# Patient Record
Sex: Female | Born: 1965 | Race: Black or African American | Hispanic: No | Marital: Single | Smoking: Former smoker
Health system: Southern US, Community
[De-identification: ages and names within clinical notes are randomized; demographics above are authoritative.]

## PROBLEM LIST (undated history)

## (undated) DIAGNOSIS — K219 Gastro-esophageal reflux disease without esophagitis: Secondary | ICD-10-CM

## (undated) DIAGNOSIS — H903 Sensorineural hearing loss, bilateral: Secondary | ICD-10-CM

## (undated) DIAGNOSIS — H9190 Unspecified hearing loss, unspecified ear: Secondary | ICD-10-CM

## (undated) DIAGNOSIS — I10 Essential (primary) hypertension: Secondary | ICD-10-CM

## (undated) DIAGNOSIS — I6522 Occlusion and stenosis of left carotid artery: Secondary | ICD-10-CM

## (undated) DIAGNOSIS — Z0000 Encounter for general adult medical examination without abnormal findings: Secondary | ICD-10-CM

## (undated) DIAGNOSIS — D649 Anemia, unspecified: Secondary | ICD-10-CM

## (undated) DIAGNOSIS — E785 Hyperlipidemia, unspecified: Secondary | ICD-10-CM

## (undated) DIAGNOSIS — C310 Malignant neoplasm of maxillary sinus: Secondary | ICD-10-CM

## (undated) DIAGNOSIS — G479 Sleep disorder, unspecified: Secondary | ICD-10-CM

## (undated) DIAGNOSIS — R1310 Dysphagia, unspecified: Secondary | ICD-10-CM

## (undated) DIAGNOSIS — C300 Malignant neoplasm of nasal cavity: Secondary | ICD-10-CM

## (undated) DIAGNOSIS — C760 Malignant neoplasm of head, face and neck: Secondary | ICD-10-CM

## (undated) DIAGNOSIS — A6000 Herpesviral infection of urogenital system, unspecified: Secondary | ICD-10-CM

## (undated) HISTORY — DX: Malignant neoplasm of nasal cavity: C30.0

## (undated) HISTORY — PX: FRACTURE SURGERY: SHX138

## (undated) HISTORY — DX: Hyperlipidemia, unspecified: E78.5

## (undated) HISTORY — DX: Unspecified hearing loss, unspecified ear: H91.90

---
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
## 1987-06-20 DIAGNOSIS — C310 Malignant neoplasm of maxillary sinus: Secondary | ICD-10-CM

## 1987-06-20 HISTORY — DX: Malignant neoplasm of maxillary sinus: C31.0

## 1999-12-13 ENCOUNTER — Emergency Department (HOSPITAL_COMMUNITY): Admission: EM | Admit: 1999-12-13 | Discharge: 1999-12-13 | Payer: Self-pay | Admitting: Emergency Medicine

## 1999-12-13 ENCOUNTER — Encounter: Payer: Self-pay | Admitting: Emergency Medicine

## 2003-02-12 ENCOUNTER — Other Ambulatory Visit: Admission: RE | Admit: 2003-02-12 | Discharge: 2003-02-12 | Payer: Self-pay | Admitting: Obstetrics and Gynecology

## 2003-04-03 ENCOUNTER — Ambulatory Visit (HOSPITAL_COMMUNITY): Admission: RE | Admit: 2003-04-03 | Discharge: 2003-04-03 | Payer: Self-pay | Admitting: Obstetrics and Gynecology

## 2003-04-03 ENCOUNTER — Encounter (INDEPENDENT_AMBULATORY_CARE_PROVIDER_SITE_OTHER): Payer: Self-pay | Admitting: *Deleted

## 2003-04-03 HISTORY — PX: TUBAL LIGATION: SHX77

## 2005-01-19 ENCOUNTER — Ambulatory Visit: Payer: Self-pay | Admitting: Internal Medicine

## 2005-01-23 ENCOUNTER — Ambulatory Visit: Payer: Self-pay | Admitting: *Deleted

## 2005-11-09 ENCOUNTER — Ambulatory Visit: Payer: Self-pay | Admitting: Family Medicine

## 2005-12-06 ENCOUNTER — Ambulatory Visit: Payer: Self-pay | Admitting: Family Medicine

## 2006-02-28 ENCOUNTER — Encounter (INDEPENDENT_AMBULATORY_CARE_PROVIDER_SITE_OTHER): Payer: Self-pay | Admitting: *Deleted

## 2006-02-28 ENCOUNTER — Ambulatory Visit: Payer: Self-pay | Admitting: Family Medicine

## 2006-04-16 ENCOUNTER — Ambulatory Visit: Payer: Self-pay | Admitting: Family Medicine

## 2006-10-08 ENCOUNTER — Ambulatory Visit: Payer: Self-pay | Admitting: Family Medicine

## 2007-01-04 ENCOUNTER — Ambulatory Visit (HOSPITAL_COMMUNITY): Admission: RE | Admit: 2007-01-04 | Discharge: 2007-01-04 | Payer: Self-pay | Admitting: Otolaryngology

## 2007-01-10 ENCOUNTER — Ambulatory Visit (HOSPITAL_COMMUNITY): Admission: RE | Admit: 2007-01-10 | Discharge: 2007-01-10 | Payer: Self-pay | Admitting: Otolaryngology

## 2007-06-05 ENCOUNTER — Ambulatory Visit (HOSPITAL_COMMUNITY): Admission: RE | Admit: 2007-06-05 | Discharge: 2007-06-06 | Payer: Self-pay | Admitting: Otolaryngology

## 2007-06-05 ENCOUNTER — Encounter (INDEPENDENT_AMBULATORY_CARE_PROVIDER_SITE_OTHER): Payer: Self-pay | Admitting: Otolaryngology

## 2007-06-05 HISTORY — PX: EXPLORATORY TYMPANOTOMY: SHX1545

## 2008-10-14 ENCOUNTER — Emergency Department (HOSPITAL_COMMUNITY): Admission: EM | Admit: 2008-10-14 | Discharge: 2008-10-14 | Payer: Self-pay | Admitting: Emergency Medicine

## 2008-12-30 ENCOUNTER — Emergency Department (HOSPITAL_COMMUNITY): Admission: EM | Admit: 2008-12-30 | Discharge: 2008-12-30 | Payer: Self-pay | Admitting: Family Medicine

## 2008-12-30 DIAGNOSIS — A6000 Herpesviral infection of urogenital system, unspecified: Secondary | ICD-10-CM

## 2008-12-30 HISTORY — DX: Herpesviral infection of urogenital system, unspecified: A60.00

## 2009-09-14 ENCOUNTER — Emergency Department (HOSPITAL_COMMUNITY): Admission: EM | Admit: 2009-09-14 | Discharge: 2009-09-14 | Payer: Self-pay | Admitting: Emergency Medicine

## 2009-10-21 ENCOUNTER — Emergency Department (HOSPITAL_COMMUNITY): Admission: EM | Admit: 2009-10-21 | Discharge: 2009-10-21 | Payer: Self-pay | Admitting: Emergency Medicine

## 2010-04-15 ENCOUNTER — Ambulatory Visit: Payer: Self-pay | Admitting: Nurse Practitioner

## 2010-04-15 DIAGNOSIS — I10 Essential (primary) hypertension: Secondary | ICD-10-CM | POA: Insufficient documentation

## 2010-04-15 LAB — CONVERTED CEMR LAB

## 2010-05-05 ENCOUNTER — Other Ambulatory Visit
Admission: RE | Admit: 2010-05-05 | Discharge: 2010-05-05 | Payer: Self-pay | Source: Home / Self Care | Admitting: Nurse Practitioner

## 2010-05-05 ENCOUNTER — Ambulatory Visit: Payer: Self-pay | Admitting: Nurse Practitioner

## 2010-05-05 ENCOUNTER — Encounter (INDEPENDENT_AMBULATORY_CARE_PROVIDER_SITE_OTHER): Payer: Self-pay | Admitting: Nurse Practitioner

## 2010-05-05 ENCOUNTER — Encounter: Payer: Self-pay | Admitting: Nurse Practitioner

## 2010-05-05 DIAGNOSIS — G479 Sleep disorder, unspecified: Secondary | ICD-10-CM

## 2010-05-05 DIAGNOSIS — C300 Malignant neoplasm of nasal cavity: Secondary | ICD-10-CM | POA: Insufficient documentation

## 2010-05-05 DIAGNOSIS — H9190 Unspecified hearing loss, unspecified ear: Secondary | ICD-10-CM | POA: Insufficient documentation

## 2010-05-05 HISTORY — DX: Sleep disorder, unspecified: G47.9

## 2010-05-05 HISTORY — DX: Unspecified hearing loss, unspecified ear: H91.90

## 2010-05-05 HISTORY — DX: Malignant neoplasm of nasal cavity: C30.0

## 2010-05-05 LAB — CONVERTED CEMR LAB
Ketones, urine, test strip: NEGATIVE
Nitrite: NEGATIVE
Rapid HIV Screen: NEGATIVE
Specific Gravity, Urine: <1.005
Urobilinogen, UA: 0.2
WBC Urine, dipstick: NEGATIVE

## 2010-05-09 ENCOUNTER — Encounter (INDEPENDENT_AMBULATORY_CARE_PROVIDER_SITE_OTHER): Payer: Self-pay | Admitting: Nurse Practitioner

## 2010-05-09 LAB — CONVERTED CEMR LAB: Retic Ct Pct: 1.3 % (ref 0.4–3.1)

## 2010-05-10 ENCOUNTER — Encounter (INDEPENDENT_AMBULATORY_CARE_PROVIDER_SITE_OTHER): Payer: Self-pay | Admitting: Nurse Practitioner

## 2010-05-10 LAB — CONVERTED CEMR LAB
Albumin: 4.7 g/dL (ref 3.5–5.2)
BUN: 8 mg/dL (ref 6–23)
Calcium: 10.1 mg/dL (ref 8.4–10.5)
Chlamydia, DNA Probe: NEGATIVE
Chloride: 90 meq/L — ABNORMAL LOW (ref 96–112)
Cholesterol: 225 mg/dL — ABNORMAL HIGH (ref 0–200)
Glucose, Bld: 67 mg/dL — ABNORMAL LOW (ref 70–99)
HDL: 64 mg/dL (ref >39)
Hemoglobin: 9.6 g/dL — ABNORMAL LOW (ref 12.0–15.0)
Lymphs Abs: 2.1 10*3/uL (ref 0.7–4.0)
MCV: 82.2 fL (ref 78.0–100.0)
Monocytes Absolute: 1 10*3/uL (ref 0.1–1.0)
Monocytes Relative: 10 % (ref 3–12)
Neutro Abs: 6.2 10*3/uL (ref 1.7–7.7)
Neutrophils Relative %: 65 % (ref 43–77)
Pap Smear: NEGATIVE
Potassium: 3.4 meq/L — ABNORMAL LOW (ref 3.5–5.3)
RBC: 3.93 M/uL (ref 3.87–5.11)
Triglycerides: 108 mg/dL (ref <150)
WBC: 9.5 10*3/uL (ref 4.0–10.5)

## 2010-05-11 ENCOUNTER — Encounter (INDEPENDENT_AMBULATORY_CARE_PROVIDER_SITE_OTHER): Payer: Self-pay | Admitting: *Deleted

## 2010-05-17 ENCOUNTER — Ambulatory Visit (HOSPITAL_COMMUNITY): Admission: RE | Admit: 2010-05-17 | Payer: Self-pay | Admitting: Internal Medicine

## 2010-05-26 ENCOUNTER — Encounter (INDEPENDENT_AMBULATORY_CARE_PROVIDER_SITE_OTHER): Payer: Self-pay | Admitting: Internal Medicine

## 2010-05-26 ENCOUNTER — Ambulatory Visit (HOSPITAL_COMMUNITY)
Admission: RE | Admit: 2010-05-26 | Discharge: 2010-05-26 | Payer: Self-pay | Source: Home / Self Care | Attending: Internal Medicine | Admitting: Internal Medicine

## 2010-05-26 DIAGNOSIS — I519 Heart disease, unspecified: Secondary | ICD-10-CM | POA: Insufficient documentation

## 2010-06-08 ENCOUNTER — Ambulatory Visit: Payer: Self-pay | Admitting: Nurse Practitioner

## 2010-06-14 ENCOUNTER — Telehealth (INDEPENDENT_AMBULATORY_CARE_PROVIDER_SITE_OTHER): Payer: Self-pay | Admitting: Nurse Practitioner

## 2010-06-17 ENCOUNTER — Ambulatory Visit: Admit: 2010-06-17 | Payer: Self-pay | Admitting: Nurse Practitioner

## 2010-07-04 DIAGNOSIS — R011 Cardiac murmur, unspecified: Secondary | ICD-10-CM | POA: Insufficient documentation

## 2010-07-09 ENCOUNTER — Encounter: Payer: Self-pay | Admitting: Internal Medicine

## 2010-07-10 ENCOUNTER — Encounter: Payer: Self-pay | Admitting: Emergency Medicine

## 2010-07-11 ENCOUNTER — Encounter: Payer: Self-pay | Admitting: Emergency Medicine

## 2010-07-14 ENCOUNTER — Ambulatory Visit
Admission: RE | Admit: 2010-07-14 | Discharge: 2010-07-14 | Payer: Self-pay | Source: Home / Self Care | Attending: Nurse Practitioner | Admitting: Nurse Practitioner

## 2010-07-14 ENCOUNTER — Encounter (INDEPENDENT_AMBULATORY_CARE_PROVIDER_SITE_OTHER): Payer: Self-pay | Admitting: Nurse Practitioner

## 2010-07-15 DIAGNOSIS — D62 Acute posthemorrhagic anemia: Secondary | ICD-10-CM | POA: Insufficient documentation

## 2010-07-15 LAB — CONVERTED CEMR LAB
BUN: 11 mg/dL (ref 6–23)
Basophils Relative: 0 % (ref 0–1)
Chloride: 95 meq/L — ABNORMAL LOW (ref 96–112)
MCHC: 30 g/dL (ref 30.0–36.0)
Monocytes Relative: 10 % (ref 3–12)
Neutro Abs: 6.7 10*3/uL (ref 1.7–7.7)
Neutrophils Relative %: 67 % (ref 43–77)
Potassium: 3.8 meq/L (ref 3.5–5.3)
RBC: 3.6 M/uL — ABNORMAL LOW (ref 3.87–5.11)
WBC: 10 10*3/uL (ref 4.0–10.5)

## 2010-07-19 NOTE — Letter (Signed)
Summary: Handout Printed  Printed Handout:  - Diet - Low-Cholesterol Guidelines 

## 2010-07-19 NOTE — Letter (Signed)
Summary: *HSN Results Follow up  [facility]  [address] [place], [place] [address]   Phone: [phone]  Fax: [phone]      05/11/2010   Stacy Anderson [address] [place], [place]  [address]   Dear  Ms. [name],                            ____S.Drinkard,FNP   ____D. Gore,FNP       ____B. McPherson,MD   ____V. Rankins,MD    ____E. Mulberry,MD    ___X_N. Daphine Deutscher, FNP  ____D. Reche Dixon, MD    ____K. Philipp Deputy, MD    ____Other     This letter is to inform you that your recent test(s):  _______Pap Smear    _______Lab Test     _______X-ray    _______ is within acceptable limits  _______ requires a medication change  _______ requires a follow-up lab visit  _______ requires a follow-up visit with your [name]   Comments: I BEEN TRYING TO CONTACT YOU OVER THE PHONE AND LEAVE YOU A                                                          MESSAGE . YOU HAVE AN APPT 05-26-10 @ 10 AM [name] FIRST FLOOR ADMITTING  YOU CAN ALSO CALL THE OFFICE  THANK YOU        _________________________________________________________ If you have any questions, please contact our office                     Sincerely,  Cheryll Dessert [facility]

## 2010-07-19 NOTE — Assessment & Plan Note (Signed)
Summary: Re-establish care   Vital Signs:  Patient profile:   45 year old female LMP:     03/2010 Height:      66.75 inches Weight:      170.2 pounds BMI:     26.95 Temp:     97.6 degrees F oral Pulse rate:   84 / minute Pulse rhythm:   regular Resp:     20 per minute BP sitting:   208 / 124  (left arm) Cuff size:   regular  Vitals Entered By: Levon Hedger (April 15, 2010 4:04 PM)  Nutrition Counseling: Patient's BMI is greater than 25 and therefore counseled on weight management options. CC: Hypertension Management Is Patient Diabetic? No Pain Assessment Patient in pain? yes     Location: head  Does patient need assistance? Functional Status Self care Ambulation Normal LMP (date): 03/2010     Enter LMP: 03/2010 Last PAP Result historical per pt   CC:  Hypertension Management.  History of Present Illness:  Pt into the office re-establish care. Pt was previously seen here but about 5 years ago was her last visit. She has been seeing Dr. Tresa Endo since her last visit here.  HTN - Pt has been out of her blood pressure meds for the past 2 months.  Pt was displaced from her job in April 2011. She has not taking her meds since August due to finances She is working a part-time job at this time.  Hypertension History:      She complains of headache, but denies chest pain and palpitations.  She notes no problems with any antihypertensive medication side effects.  pt has not been taking meds for the past 2 months.        Positive major cardiovascular risk factors include hypertension and current tobacco user.  Negative major cardiovascular risk factors include female age less than 29 years old.     Habits & Providers  Alcohol-Tobacco-Diet     Alcohol drinks/day: 0     Tobacco Status: current     Tobacco Counseling: to quit use of tobacco products     Cigarette Packs/Day: cigars  Exercise-Depression-Behavior     Have you felt down or hopeless? no     Have you  felt little pleasure in things? no     Drug Use: never  Allergies (verified): No Known Drug Allergies  Family History: mother - htn father- diabetes  Social History: single no children tobacco - cigars drug -none ETOH - noneSmoking Status:  current Drug Use:  never Packs/Day:  cigars  Review of Systems CV:  Denies chest pain or discomfort. Resp:  Denies cough. GI:  Denies abdominal pain, nausea, and vomiting.  Physical Exam  General:  alert.   Head:  normocephalic.   Lungs:  normal breath sounds.   Heart:  normal rate and regular rhythm.   Abdomen:  normal bowel sounds.   Msk:  normal ROM.   Neurologic:  alert & oriented X3.   Psych:  Oriented X3.     Impression & Recommendations:  Problem # 1:  HYPERTENSION, BENIGN ESSENTIAL (ICD-401.1) DASH diet restarted med  Her updated medication list for this problem includes:    Hydrochlorothiazide 25 Mg Tabs (Hydrochlorothiazide) ..... One tablet by mouth daily for blood pressure    Amlodipine Besylate 5 Mg Tabs (Amlodipine besylate) ..... One tablet by mouth daily for blood pressure  Problem # 2:  NEED PROPHYLACTIC VACCINATION&INOCULATION FLU (ICD-V04.81) Assessment: Unchanged given today  Complete Medication List: 1)  Hydrochlorothiazide 25 Mg Tabs (Hydrochlorothiazide) .... One tablet by mouth daily for blood pressure 2)  Amlodipine Besylate 5 Mg Tabs (Amlodipine besylate) .... One tablet by mouth daily for blood pressure  Hypertension Assessment/Plan:      The patient's hypertensive risk group is category B: At least one risk factor (excluding diabetes) with no target organ damage.  Today's blood pressure is 208/124.  Her blood pressure goal is < 140/90.  Patient Instructions: 1)  Schedule an appointment for a complete physical exam and blood pressure. 2)  Come fasting after midnight before this visit. 3)  Will need fasting labs, PAP, mammogram, PHQ-9, u/a, microalbumin, EKG.   Prescriptions: HYDROCHLOROTHIAZIDE 25 MG TABS (HYDROCHLOROTHIAZIDE) One tablet by mouth daily for blood pressure  #30 x 1   Entered and Authorized by:   Lehman Prom FNP   Signed by:   Lehman Prom FNP on 04/15/2010   Method used:   Print then Give to Patient   RxID:   [id number] AMLODIPINE BESYLATE 5 MG TABS (AMLODIPINE BESYLATE) One tablet by mouth daily for blood pressure  #30 x 1   Entered and Authorized by:   Lehman Prom FNP   Signed by:   Lehman Prom FNP on 04/15/2010   Method used:   Print then Give to Patient   RxID:   [id number]    Orders Added: 1)  Est. Patient Level III [95284]  Appended Document: Re-establish care     Allergies: No Known Drug Allergies   Complete Medication List: 1)  Hydrochlorothiazide 25 Mg Tabs (Hydrochlorothiazide) .... One tablet by mouth daily for blood pressure 2)  Amlodipine Besylate 5 Mg Tabs (Amlodipine besylate) .... One tablet by mouth daily for blood pressure  Other Orders: Flu Vaccine 84yrs + (13244) Admin 1st Vaccine (01027)   Orders Added: 1)  Flu Vaccine 31yrs + [25366] 2)  Admin 1st Vaccine [44034]   Immunizations Administered:  Influenza Vaccine # 1:    Vaccine Type: Fluvax 3+    Site: right deltoid    Mfr: GlaxoSmithKline    Dose: 0.5 ml    Route: IM    Given by: Levon Hedger    Exp. Date: 12/17/2010    Lot #: [id number]    VIS given: 01/11/10 version given May 05, 2010.  Flu Vaccine Consent Questions:    Do you have a history of severe allergic reactions to this vaccine? no    Any prior history of allergic reactions to egg and/or gelatin? no    Do you have a sensitivity to the preservative Thimersol? no    Do you have a past history of Guillan-Barre Syndrome? no    Do you currently have an acute febrile illness? no    Have you ever had a severe reaction to latex? no    Vaccine information given and explained to patient? yes    Are you currently pregnant? no    ndc   [phone]  Immunizations Administered:  Influenza Vaccine # 1:    Vaccine Type: Fluvax 3+    Site: right deltoid    Mfr: GlaxoSmithKline    Dose: 0.5 ml    Route: IM    Given by: Levon Hedger    Exp. Date: 12/17/2010    Lot #: [id number]    VIS given: 01/11/10 version given May 05, 2010.

## 2010-07-19 NOTE — Assessment & Plan Note (Signed)
Summary: Complete Physical exam   Vital Signs:  Patient profile:   45 year old female Menstrual status:  irregular LMP:     04/19/2010 Weight:      161.5 pounds BMI:     25.58 Temp:     96.9 degrees F oral Pulse rate:   80 / minute Pulse rhythm:   regular Resp:     20 per minute BP sitting:   146 / 90  (left arm) Cuff size:   regular  Vitals Entered By: Levon Hedger (May 05, 2010 10:07 AM)  Nutrition Counseling: Patient's BMI is greater than 25 and therefore counseled on weight management options. CC: CPP Is Patient Diabetic? No Pain Assessment Patient in pain? no       Does patient need assistance? Functional Status Self care Ambulation Normal LMP (date): 04/19/2010 LMP - Character: normal     Menstrual flow (days): 7 Menstrual Status irregular Enter LMP: 04/19/2010 Last PAP Result historical per pt   CC:  CPP.  History of Present Illness:  Pt into the office for a complete physical exam  PAP - all previous PAP smears ok.  last done 2 years ago. Menses - irregular. LMP November 1st and stayed for 7 days but prior to then menses was last in July 2011 No children Pt is sexually active.  Mammogram - pt has never had a mammogram No family history of breast cancer admits that she does check her breast at home  Optho - wears eye glasses that she uses mainly to drive  Dental - dentures  tdap - Been over 10 years since last tetanus  Pt is fasting today for labs.  Habits & Providers  Alcohol-Tobacco-Diet     Alcohol drinks/day: 0     Tobacco Status: current     Tobacco Counseling: to quit use of tobacco products     Cigarette Packs/Day: cigars  Exercise-Depression-Behavior     Drug Use: never  Comments: PHQ- 9 score = 10  Allergies (verified): No Known Drug Allergies  Review of Systems General:  Complains of sleep disorder; denies fever; started about 3 months ago with the loss of her job. no problems prior to then. Eyes:  Denies  discharge. ENT:  Complains of decreased hearing; wears bilateral hearing aids since age 54. CV:  Denies fatigue. Resp:  Denies cough. GI:  Denies abdominal pain, nausea, and vomiting. GU:  Denies discharge. MS:  Denies joint pain. Derm:  Denies dryness. Neuro:  Denies headaches. Psych:  Denies depression. Endo:  Denies excessive urination.  Physical Exam  General:  alert.   Head:  normocephalic.   Eyes:  pupils equal and pupils round.   Ears:  hearing aids bil TM with bony landmarks present - no cerumen present Nose:  no nasal discharge.   Mouth:  dentures Neck:  supple.   Chest Wall:  no mass.   Breasts:  no abnormal thickening.   Lungs:  normal breath sounds.   Heart:  normal rate, regular rhythm, and grade 3 /6 HSM.   Abdomen:  soft, non-tender, and normal bowel sounds.   Rectal:  no external abnormalities.    Pelvic Exam  Vulva:      normal appearance.   Urethra and Bladder:      Urethra--no discharge.   Vagina:      physiologic discharge, post-menopausal.   Cervix:      midposition, nulliparous.   Uterus:      smooth.   Adnexa:  nontender bilaterally.   Rectum:      normal, heme negative stool.      Impression & Recommendations:  Problem # 1:  ROUTINE GYNECOLOGICAL EXAMINATION (ICD-V72.31) PHQ-9 score = 10 labs done PAP done rec optho and dental exam Orders: Hemoccult Guaiac-1 spec.(in office) (82270) KOH/ WET Mount [phone]) Pap Smear, Thin Prep ( Collection of) (U0454) UA Dipstick w/o Micro (manual) (09811) T-Syphilis Test (RPR) (91478-29562) Rapid HIV  (92370) T- GC Chlamydia (13086)  Problem # 2:  UNSPECIFIED BREAST SCREENING (ICD-V76.10)  self breast exam placcard given to pt mammogram scheduled  Orders: Mammogram (Screening) (Mammo)  Problem # 3:  CARDIAC MURMUR (ICD-785.2)  Orders: 2 D Echo (2 D Echo)  Problem # 4:  HYPERTENSION, BENIGN ESSENTIAL (ICD-401.1) BP elevated today  will monitor, may need additional meds Her  updated medication list for this problem includes:    Hydrochlorothiazide 25 Mg Tabs (Hydrochlorothiazide) ..... One tablet by mouth daily for blood pressure    Amlodipine Besylate 5 Mg Tabs (Amlodipine besylate) ..... One tablet by mouth daily for blood pressure  Orders: EKG w/ Interpretation (93000) T-Lipid Profile (57846-96295) T-Comprehensive Metabolic Panel (28413-24401) T-CBC w/Diff (02725-36644) T-TSH (03474-25956) T-Urine Microalbumin w/creat. ratio [phone]) 2 D Echo (2 D Echo)  Complete Medication List: 1)  Hydrochlorothiazide 25 Mg Tabs (Hydrochlorothiazide) .... One tablet by mouth daily for blood pressure 2)  Amlodipine Besylate 5 Mg Tabs (Amlodipine besylate) .... One tablet by mouth daily for blood pressure  Other Orders: Tdap => 29yrs IM (41660) Admin 1st Vaccine (63016)  Patient Instructions: 1)  You have been given a tetanus today.  2)  This is good for 10 years. 3)  Blood pressure - slightly elevated today. Keep taking your blood pressure medications.  You have a heart murmur.  This office will schedule you for a echocardiogram to take a picture of your heart. You will be notified of the time/date of this appointment. 4)  Follow up with n.martin,fnp in 4 weeks for blood pressure and Echo results.  Take your medications before this visit   Orders Added: 1)  Est. Patient age 56-64 [phone] 2)  Hemoccult Guaiac-1 spec.(in office) [82270] 3)  KOH/ WET Mount [87210] 4)  Pap Smear, Thin Prep ( Collection of) [Q0091] 5)  UA Dipstick w/o Micro (manual) [81002] 6)  EKG w/ Interpretation [93000] 7)  T-Lipid Profile [80061-22930] 8)  T-Comprehensive Metabolic Panel [80053-22900] 9)  T-CBC w/Diff [23557-32202] 10)  T-TSH [54270-62376] 11)  T-Syphilis Test (RPR) [28315-17616] 12)  Rapid HIV  [92370] 13)  T-Urine Microalbumin w/creat. ratio [82043-[address]] 14)  T- GC Chlamydia [07371] 15)  Mammogram (Screening) [Mammo] 16)  2 D Echo [2 D Echo] 17)  Tdap =>  41yrs IM [90715] 18)  Admin 1st Vaccine [06269]   Immunizations Administered:  Tetanus Vaccine:    Vaccine Type: Tdap    Site: left deltoid    Mfr: Sanofi Pasteur    Dose: 0.5 ml    Route: IM    Given by: Levon Hedger    Exp. Date: 09/15/2012    Lot #: [id number]    VIS given: 05/06/08 version given May 05, 2010.   Immunizations Administered:  Tetanus Vaccine:    Vaccine Type: Tdap    Site: left deltoid    Mfr: Sanofi Pasteur    Dose: 0.5 ml    Route: IM    Given by: Levon Hedger    Exp. Date: 09/15/2012    Lot #: [id number]    VIS given: 05/06/08 version  given May 05, 2010.  Prevention & Chronic Care Immunizations   Influenza vaccine: given   (04/15/2010)    Tetanus booster: 05/05/2010: Tdap    Pneumococcal vaccine: Not documented  Other Screening   Pap smear: historical per pt  (04/15/2010)    Mammogram: Not documented   Smoking status: current  (05/05/2010)  Lipids   Total Cholesterol: Not documented   LDL: Not documented   LDL Direct: Not documented   HDL: Not documented   Triglycerides: Not documented  Hypertension   Last Blood Pressure: 146 / 90  (05/05/2010)   Serum creatinine: Not documented   Serum potassium Not documented CMP ordered   Self-Management Support :    Hypertension self-management support: Not documented    EKG  Procedure date:  05/05/2010  Findings:      NSR T wave abnormality   Laboratory Results   Urine Tests  Date/Time Received: May 05, 2010 11:19 AM   Routine Urinalysis   Color: lt. yellow Glucose: negative   (Normal Range: Negative) Bilirubin: negative   (Normal Range: Negative) Ketone: negative   (Normal Range: Negative) Spec. Gravity: <1.005   (Normal Range: 1.003-1.035) Blood: negative   (Normal Range: Negative) pH: 7.0   (Normal Range: 5.0-8.0) Protein: negative   (Normal Range: Negative) Urobilinogen: 0.2   (Normal Range: 0-1) Nitrite: negative   (Normal Range:  Negative) Leukocyte Esterace: negative   (Normal Range: Negative)    Date/Time Received: May 05, 2010 11:17 AM   Wet Mount Source: vaginal WBC/hpf: 1-5 Bacteria/hpf: rare Clue cells/hpf: few Yeast/hpf: none Wet Mount KOH: Negative Trichomonas/hpf: none  Other Tests  Rapid HIV: negative  Stool - Occult Blood Hemmoccult #1: negative Date: 05/05/2010

## 2010-07-19 NOTE — Progress Notes (Signed)
Summary: Office Visit//DEPRESSION SCREENING  Office Visit//DEPRESSION SCREENING   Imported By: Arta Bruce 05/05/2010 14:44:56  _____________________________________________________________________  External Attachment:    Type:   Image     Comment:   External Document

## 2010-07-19 NOTE — Letter (Signed)
Summary: Lipid Letter  [facility]  [address] [place], [place] [address]   Phone: [phone]  Fax: [phone]    05/10/2010  Stacy Anderson [address] [place], [place]  [address]  Dear Aurther Loft:  We have carefully reviewed your last lipid profile from 05/05/2010 and the results are noted below with a summary of recommendations for lipid management.    Cholesterol:       225     Goal: less than 200   HDL "good" Cholesterol:   64     Goal: greater than 40   LDL "bad" Cholesterol:   139     Goal: less than 130   Triglycerides:       108     Goal: less than 150    Your cholesterol labs are elevated. Read the attached handout. You should try to avoid fried fatty foods. You should have been contacted by this office about the change in your blood pressure medications.  Keep your appointment to recheck your labs and blood pressure. Pap Smear results are normal.      Current Medications: 1)    Triamterene-hctz 37.5-25 Mg Tabs (Triamterene-hctz) .... One tablet by mouth daily for blood pressure 2)    Amlodipine Besylate 5 Mg Tabs (Amlodipine besylate) .... One tablet by mouth daily for blood pressure  If you have any questions, please call. We appreciate being able to work with you.   Sincerely,    [facility]

## 2010-07-21 NOTE — Progress Notes (Signed)
Summary: Needs to have labs done  Phone Note Outgoing Call   Summary of Call: BMET and CBC not done on last visit -- needs to come in for redraw -- no charge for visit.  Left message on answering machine for pt. to return call.  Initial call taken by: Dutch Quint RN,  June 14, 2010 10:01 AM  Follow-up for Phone Call        Left message on answering machine for pt. to return call.  Dutch Quint RN  June 15, 2010 9:41 AM  Lab appt. 06/17/10.  Dutch Quint RN  June 15, 2010 10:14 AM

## 2010-07-21 NOTE — Assessment & Plan Note (Signed)
Summary: BP recheck  Nurse Visit   Vital Signs:  Patient profile:   45 year old female Menstrual status:  irregular Temp:     97.2 degrees F oral Pulse rate:   84 / minute Pulse rhythm:   regular Resp:     20 per minute BP sitting:   144 / 96  (right arm) Cuff size:   regular  Vitals Entered By: Dutch Quint RN (June 10, 2010 11:09 AM)  Impression & Recommendations:  Problem # 1:  HYPERTENSION, BENIGN ESSENTIAL (ICD-401.1)  BP still elevated Continue triamterene-HCTZ Increase amlodipine to 10 mg. daily F/U in 6 weeks with provider  Her updated medication list for this problem includes:    Triamterene-hctz 37.5-25 Mg Tabs (Triamterene-hctz) ..... One tablet by mouth daily for blood pressure    Amlodipine Besylate 10 Mg Tabs (Amlodipine besylate) ..... One tab by mouth daily for blood pressure  Complete Medication List: 1)  Triamterene-hctz 37.5-25 Mg Tabs (Triamterene-hctz) .... One tablet by mouth daily for blood pressure 2)  Amlodipine Besylate 10 Mg Tabs (Amlodipine besylate) .... One tab by mouth daily for blood pressure 3)  Ferrous Sulfate 325 (65 Fe) Mg Tabs (Ferrous sulfate) .... One tablet by mouth two times a day   Patient Instructions: 1)  Reviewed with Jesse Fall 2)  Your blood pressure is still elevated. 3)  Continue taking triamterene-HCTZ as ordered. 4)  Start taking amlodipine 10 mg. 1 tablet by mouth daily. 5)  Make appointment to see provider in 6 weeks for follow-up. 6)  Call if anything changes or if you have any questions.   CC:  BP recheck.  History of Present Illness: 05/05/10 BP 146/90.  HCTZ changed to trimaterene -HCTZ.  Started new Rx immediately, has been taking every day.  Took meds this morning.  States having dry mouth since starting the triamterene-HCTZ.  Drinls plenty of water, no increase in voiding.   Physical Exam  General:  alert, well-developed, well-nourished, and well-hydrated.     Review of Systems CV:  Feels dizzy  after taking trimaterene-HCTZ, lasts only about 20 minutes..  CC: BP recheck Is Patient Diabetic? No Pain Assessment Patient in pain? no       Does patient need assistance? Functional Status Self care Ambulation Normal   Allergies: No Known Drug Allergies  Orders Added: 1)  Est. Patient Level II [04540] Prescriptions: TRIAMTERENE-HCTZ 37.5-25 MG TABS (TRIAMTERENE-HCTZ) One tablet by mouth daily for blood pressure  #30 x 3   Entered by:   Dutch Quint RN   Authorized by:   Lehman Prom FNP   Signed by:   Dutch Quint RN on 06/10/2010   Method used:   Electronically to        [facility] [id number]* (retail)       [address]       [place], [place]  [address]       Ph: [phone]       Fax: [phone]   RxID:   [id number] AMLODIPINE BESYLATE 10 MG TABS (AMLODIPINE BESYLATE) one tab by mouth daily for blood pressure  #30 x 3   Entered by:   Dutch Quint RN   Authorized by:   Lehman Prom FNP   Signed by:   Dutch Quint RN on 06/10/2010   Method used:   Electronically to        [facility] [id number]* (retail)       [address]       [place],  [place]  [address]       Ph: [phone]       Fax: [phone]   RxID:   [id number]

## 2010-07-22 ENCOUNTER — Ambulatory Visit: Admit: 2010-07-22 | Payer: Self-pay | Admitting: Nurse Practitioner

## 2010-08-11 ENCOUNTER — Encounter: Payer: Self-pay | Admitting: Nurse Practitioner

## 2010-08-11 ENCOUNTER — Encounter (INDEPENDENT_AMBULATORY_CARE_PROVIDER_SITE_OTHER): Payer: Self-pay | Admitting: Nurse Practitioner

## 2010-08-12 ENCOUNTER — Encounter: Payer: Self-pay | Admitting: Oncology

## 2010-08-12 LAB — CONVERTED CEMR LAB
BUN: 7 mg/dL (ref 6–23)
Basophils Relative: 1 % (ref 0–1)
Calcium: 9.3 mg/dL (ref 8.4–10.5)
Creatinine, Ser: 0.84 mg/dL (ref 0.40–1.20)
Eosinophils Absolute: 0.4 10*3/uL (ref 0.0–0.7)
Eosinophils Relative: 4 % (ref 0–5)
Hemoglobin: 8.8 g/dL — ABNORMAL LOW (ref 12.0–15.0)
MCHC: 30.1 g/dL (ref 30.0–36.0)
MCV: 86.4 fL (ref 78.0–100.0)
Monocytes Absolute: 1.1 10*3/uL — ABNORMAL HIGH (ref 0.1–1.0)
Monocytes Relative: 10 % (ref 3–12)
Neutrophils Relative %: 69 % (ref 43–77)
RBC: 3.38 M/uL — ABNORMAL LOW (ref 3.87–5.11)

## 2010-09-11 LAB — CBC
HCT: 25 % — ABNORMAL LOW (ref 36.0–46.0)
Hemoglobin: 7.7 g/dL — ABNORMAL LOW (ref 12.0–15.0)
MCHC: 31.1 g/dL (ref 30.0–36.0)
MCV: 77.6 fL — ABNORMAL LOW (ref 78.0–100.0)
RBC: 3.23 MIL/uL — ABNORMAL LOW (ref 3.87–5.11)
RBC: 3.29 MIL/uL — ABNORMAL LOW (ref 3.87–5.11)

## 2010-09-11 LAB — POCT I-STAT, CHEM 8
HCT: 27 % — ABNORMAL LOW (ref 36.0–46.0)
Hemoglobin: 9.2 g/dL — ABNORMAL LOW (ref 12.0–15.0)
Potassium: 3.9 mEq/L (ref 3.5–5.1)
Sodium: 138 mEq/L (ref 135–145)

## 2010-09-11 LAB — DIFFERENTIAL
Band Neutrophils: 0 % (ref 0–10)
Band Neutrophils: 0 % (ref 0–10)
Basophils Absolute: 0 10*3/uL (ref 0.0–0.1)
Basophils Relative: 0 % (ref 0–1)
Basophils Relative: 2 % — ABNORMAL HIGH (ref 0–1)
Blasts: 0 %
Eosinophils Absolute: 0.3 10*3/uL (ref 0.0–0.7)
Eosinophils Relative: 3 % (ref 0–5)
Lymphocytes Relative: 11 % — ABNORMAL LOW (ref 12–46)
Lymphs Abs: 1.5 10*3/uL (ref 0.7–4.0)
Metamyelocytes Relative: 0 %
Monocytes Absolute: 0.6 10*3/uL (ref 0.1–1.0)
Monocytes Absolute: 1.5 10*3/uL — ABNORMAL HIGH (ref 0.1–1.0)
Monocytes Relative: 11 % (ref 3–12)
Monocytes Relative: 5 % (ref 3–12)
Myelocytes: 0 %
Neutro Abs: 9.3 10*3/uL — ABNORMAL HIGH (ref 1.7–7.7)
Neutrophils Relative %: 70 % (ref 43–77)
nRBC: 0 /100 WBC

## 2010-09-25 LAB — HIV ANTIBODY (ROUTINE TESTING W REFLEX): HIV: NONREACTIVE

## 2010-09-25 LAB — RPR: RPR Ser Ql: NONREACTIVE

## 2010-09-25 LAB — URINE CULTURE

## 2010-09-25 LAB — POCT URINALYSIS DIP (DEVICE)
Bilirubin Urine: NEGATIVE
Glucose, UA: NEGATIVE mg/dL
Ketones, ur: NEGATIVE mg/dL
Nitrite: NEGATIVE

## 2010-09-25 LAB — WET PREP, GENITAL: Trich, Wet Prep: NONE SEEN

## 2010-09-25 LAB — HERPES SIMPLEX VIRUS CULTURE

## 2010-09-25 LAB — HEPATITIS B SURFACE ANTIGEN: Hepatitis B Surface Ag: NEGATIVE

## 2010-11-01 NOTE — Op Note (Signed)
NAMEJALONI, Stacy Anderson NO.:  [id number]   MEDICAL RECORD NO.:  [id number]          PATIENT TYPE:  OIB   LOCATION:  5118                         FACILITY:  [facility]   PHYSICIAN:  Hermelinda Medicus, M.D.   DATE OF BIRTH:  31-Jan-1966   DATE OF PROCEDURE:  06/05/2007  DATE OF DISCHARGE:                               OPERATIVE REPORT   PREOPERATIVE DIAGNOSIS:  Right ear canal or intra-oral polyp with  probable involvement of tympanic membrane.  Rule out cholesteatoma.   POSTOPERATIVE DIAGNOSIS:  Right ear canal or intra-oral polyp with  probable involvement of tympanic membrane.  Rule out cholesteatoma.   OPERATION:  Removal of right intra-oral polyp with exploratory  tympanotomy, with removal of granulation tissue.   SURGEON:  Hermelinda Medicus, M.D.   ANESTHESIA:  Local MAC anesthesia with Dr. Maren Beach.   INDICATIONS FOR PROCEDURE:  The patient was reviewed by the  anesthesiologist and by Dr. Sunny Schlein. Sethi and cleared by both doctors,  as we were concerned about her vascular flow, as she has just one  carotid artery that is open.  The observation here was to keep her on  the aspirin 325 mg and also to keep her blood pressure elevated during  the surgery, which will be carefully monitored.  The patient is aware of  the risks and gains.  She wishes to get rid of this polyp, so she can  get rid of the chronic infection and be able to possibly use a hearing  aid on this right side, if her hearing situation improves.  Right how  her audiogram is showing severe losses on both sides, on the right 85  decibel with a 36 low discrimination score probably because of the  polyp, and on the left side a 70 speech perception threshold with a 76  discrimination score.  She has a hearing aid on the left side.   DESCRIPTION OF PROCEDURE:  The patient was placed in the supine  position, and under local MAC anesthesia with the blood pressure  monitored carefully.  The range was  160/100, 140/90 during the  procedure.  The patient was prepped and draped in the usual manner using  the Betadine and the usual ear drape.  The scope was brought in place.  The polyp was evaluated.  There was 1% Xylocaine with epinephrine  injected in the four corners of the external ear canal.  This polyp was  slowly and carefully removed.  We could not see the tympanic membrane  well, but as we removed this, it appeared that it was attached to the  superior aspect of the external ear canal and the pars flaccida region  and the superior aspect of the tympanic membrane; however, when we  removed it, it appeared that the squamous layer was involved but the  fibrous layer was in good condition; however, at the superior aspect we  were concerned about the pars flaccida.  We therefore did a tympanotomy  incision and reflected the canal skin and the tympanic membrane back,  and evaluated the middle ear and found this  to be free of any  cholesteatoma.  There was a small amount of granulation tissue.  It was  felt with a double layer of  the tympanic membrane, mucous membrane  layer and the fibrous layer, that we would not do a tympanoplasty.  Therefore we just did the exploratory tympanotomy where the ossicular  chain was found to be in good condition.  Once this was completed, the  tympanic membrane was placed back in its original position, along with  the external ear canal, and then Gelfoam with Ciprodex drops were placed  in the external ear canal, along with Neosporin ointment, along with  cotton in the external ear canal.   The patient was alert and oriented and the facial nerve was intact,  symmetrical.  The very large polyp that we removed that was essentially  occluding the external ear canal was sent to pathology.   The patient tolerated the procedure well and will be observed overnight  because of her neurologic issues and vascular flow issues.  This will be  a 23-hour  observation.   FOLLOWUP:  Will be in five days, 10 days, three weeks, five weeks, three  months, six months and one year.           ______________________________  Hermelinda Medicus, M.D.     JC/MEDQ  D:  06/05/2007  T:  06/05/2007  Job:  [id number]   cc:   Candyce Churn, M.D.  Reuben Likes, M.D.  Pramod P. Pearlean Brownie, MD
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
## 2010-11-04 NOTE — Op Note (Signed)
NAME:  Stacy Anderson, Stacy Anderson                          ACCOUNT NO.:  [id number]   MEDICAL RECORD NO.:  [id number]                   PATIENT TYPE:  AMB   LOCATION:  SDC                                  FACILITY:  [facility]   PHYSICIAN:  James A. Ashley Royalty, M.D.             DATE OF BIRTH:  1966/02/18   DATE OF PROCEDURE:  04/03/2003  DATE OF DISCHARGE:                                 OPERATIVE REPORT   PREOPERATIVE DIAGNOSIS:  Desire for attempt at permanent surgical  sterilization.   POSTOPERATIVE DIAGNOSIS:  1. Desire for attempt at permanent surgical sterilization.  2. Left ovarian cyst -- appears physiologic.  3. Right hydatid cyst of Morgagni.   PROCEDURE:  Laparoscopic bilateral tubal sterilization.  (fallopian rings)  There were two excisions of right hydatid cysts of Morgagni.   SURGEON:  Rudy Jew. Ashley Royalty, M.D.   ANESTHESIA:  General.   ESTIMATED BLOOD LOSS:  Less than 25 cc.   COMPLICATIONS:  None.   PACKS AND DRAINS:  None.   PROCEDURE:  The patient was taken to the operating room, placed in the  dorsal supine position.  After adequate general anesthesia was administered,  she was placed in the lithotomy position and prepped and draped in the usual  manner for abdominal and vaginal surgery.  Posterior weighted retractor was  placed per vagina.  Anterior lip of the cervix was grasped with a single-  toothed tenaculum.  Charcot uterine manipulator was placed per cervix and  held in place with a tenaculum.  The bladder was drained with a red rubber  catheter.  Approximately 2 cc of 0.5% Marcaine 1:100,000 epinephrine were  instilled into the inferior aspect of the umbilicus.  A 1.2 cm infraumbilical incision was made and the disposable size 10-11  laparoscopic trocar was placed into the abdominal cavity.  Initially it  could not be verified as all the way into the abdominal cavity.  Hence it  was withdrawn and replaced successfully.  Its location was verified by  placement of  the laparoscope.  CO2 was used to create a pneumoperitoneum,  which was then maintained throughout the procedure.  Next, a 5 mm midline  suprapubic trocar was placed, using direct visualization and  transillumination techniques.  The pelvis was thoroughly surveyed.   The uterus was normal size, shape and contour, without evidence of any  endometriosis or sizable fibroids.  Near the left uterine cornua there  appeared to be perhaps an 8 mm fibroid located subserosally.  The fallopian  tubes were bilaterally normal size, shape, contour and length; with  luxuriant fimbria.  The right ovary was normal size, shape and contour;  without evidence of any cysts or any endometriosis.  The left ovary measured  approximately 4 x 4, with a 2 cm apparently physiologic cyst located on it.  This was not disturbed.  The right fallopian tube also contained a right  hydatid cyst of  Morgagni.  The remainder of the peritoneal surfaces were  smooth and glistening.  I could appreciate no evidence of endometriosis.  I  could appreciate no evidence of any surgical trauma.   Next, a fallopian ring trocar was placed in the identical defect as the  previously placed 5 mm trocar.  The right fallopian tube was grasped in the  distal isthmic to proximal ampullary portion.  A fallopian ring was applied  without difficulty.  Excellent knuckle of tube was noted to be contained  within the ring.  Excellent blanching of tissue was noted.  The left  fallopian tube was then grasped in the distal isthmic to proximal ampullary  portion.  A fallopian ring was applied without difficulty.  Excellent  blanching of tissue was noted.  An excellent knuckle of tube was noted to be  contained within the ring.   Appropriate photos were obtained before and after.   Next, the stalk to the hydatid cyst of Morgagni was easily coagulated with  the bipolar cautery.  The hydatid cyst was excised with the scissors and  removed through the  superior operative port without difficulty; submitted to  pathology for physiologic studies.   At this point the patient was felt to have benefited maximally from the  surgical procedure.  The abdominal instruments were then removed, and  pneumoperitoneum evacuated.  The fascial defects were closed with 2-0 Vicryl  in interrupted fashion.  The skin was closed with 3-0 Monocryl in a  subcuticular fashion.  The remainder of the 10 cc of Marcaine was then  instilled into the incisions, in order to aid in postoperative analgesia.  Vaginal instruments were removed.  A small laceration on the right side of  the cervix from the tenaculum was easily closed with 2-0 chromic.  The  patient tolerated the procedure extremely well and was returned to the  recovery room in good condition.                                               James A. Ashley Royalty, M.D.    JAM/MEDQ  D:  04/03/2003  T:  04/03/2003  Job:  [id number]

## 2011-03-24 LAB — URINALYSIS, ROUTINE W REFLEX MICROSCOPIC
Bilirubin Urine: NEGATIVE
Glucose, UA: NEGATIVE
Hgb urine dipstick: NEGATIVE
Ketones, ur: NEGATIVE
Nitrite: NEGATIVE
Protein, ur: NEGATIVE
Specific Gravity, Urine: 1.005
Urobilinogen, UA: 0.2
pH: 7

## 2011-03-24 LAB — URINE MICROSCOPIC-ADD ON

## 2011-03-24 LAB — BASIC METABOLIC PANEL WITH GFR
Calcium: 9
GFR calc Af Amer: >60
GFR calc non Af Amer: >60
Glucose, Bld: 78
Potassium: 4.2
Sodium: 137

## 2011-03-24 LAB — CBC
HCT: 29.6 — ABNORMAL LOW
Hemoglobin: 9.8 — ABNORMAL LOW
MCHC: 33.2
MCV: 88.5
Platelets: 530 — ABNORMAL HIGH
RBC: 3.34 — ABNORMAL LOW
RDW: 17.5 — ABNORMAL HIGH
WBC: 16.5 — ABNORMAL HIGH

## 2011-03-24 LAB — BASIC METABOLIC PANEL
BUN: 7
CO2: 25
Chloride: 104
Creatinine, Ser: 0.86

## 2011-04-03 LAB — CREATININE, SERUM
Creatinine, Ser: 0.83
GFR calc Af Amer: >60
GFR calc non Af Amer: >60

## 2011-04-03 LAB — BUN: BUN: 12

## 2012-09-02 ENCOUNTER — Emergency Department (HOSPITAL_COMMUNITY)
Admission: EM | Admit: 2012-09-02 | Discharge: 2012-09-02 | Disposition: A | Discharge status: Home / Self Care | Payer: Self-pay | Source: Home / Self Care

## 2012-09-02 ENCOUNTER — Encounter (HOSPITAL_COMMUNITY): Payer: Self-pay

## 2012-09-02 DIAGNOSIS — I10 Essential (primary) hypertension: Secondary | ICD-10-CM

## 2012-09-02 DIAGNOSIS — J329 Chronic sinusitis, unspecified: Secondary | ICD-10-CM

## 2012-09-02 HISTORY — DX: Essential (primary) hypertension: I10

## 2012-09-02 MED ORDER — AMOXICILLIN 500 MG PO TABS: 500.0000 mg | ORAL_TABLET | Freq: Two times a day (BID) | ORAL | Status: DC

## 2012-09-02 MED ORDER — LISINOPRIL 20 MG PO TABS: 20.0000 mg | ORAL_TABLET | Freq: Every day | ORAL | Status: DC

## 2012-09-02 MED ORDER — DEXTROMETHORPHAN HBR 15 MG/5ML PO SYRP: 10.0000 mL | ORAL_SOLUTION | Freq: Four times a day (QID) | ORAL | Status: DC | PRN

## 2012-09-02 MED ORDER — AMLODIPINE BESYLATE 10 MG PO TABS: 10.0000 mg | ORAL_TABLET | Freq: Every day | ORAL | Status: DC

## 2012-09-02 NOTE — ED Provider Notes (Signed)
History     CSN: [id number]  Arrival date & time 09/02/12  1532   None     Chief Complaint  Patient presents with  . Hypertension    (Consider location/radiation/quality/duration/timing/severity/associated sxs/prior treatment) HPI Patient is 47 year old female who presents with main concern of ongoing headache in for head area and around the nasal area, 7 days in duration, associated with subjective fevers and chills, and cough productive of yellow sputum. Patient denies any chest pain or shortness of breath, no difficulty swallowing, no other systemic concerns, no specific abdominal or urinary symptoms. Patient denies other types of headaches, describes this headache as typical for her sinus infection, intermittent episodes, pressure-like type headache 3/10 in severity when present, with no specific alleviating or aggravating factors, nonradiating.  Past Medical History  Diagnosis Date  . Hypertension     History reviewed. No pertinent past surgical history.  Family history of high blood pressure  History  Substance Use Topics  . Smoking status: Never Smoker   . Smokeless tobacco: Not on file  . Alcohol Use: No    OB History   Grav Para Term Preterm Abortions TAB SAB Ect Mult Living                  Review of Systems  Constitutional: Negative for diaphoresis, activity change, appetite change and fatigue.  HENT: Negative for ear pain, nosebleeds, facial swelling, rhinorrhea, neck pain, neck stiffness and ear discharge.   Eyes: Negative for pain, discharge, redness, itching and visual disturbance.  Respiratory: Negative for cough, choking, chest tightness, shortness of breath, wheezing and stridor.   Cardiovascular: Negative for chest pain, palpitations and leg swelling.  Gastrointestinal: Negative for abdominal distention.  Genitourinary: Negative for dysuria, urgency, frequency, hematuria, flank pain, decreased urine volume, difficulty urinating and dyspareunia.   Musculoskeletal: Negative for back pain, joint swelling, arthralgias and gait problem.  Neurological: Negative for dizziness, tremors, seizures, syncope, facial asymmetry, speech difficulty, weakness, light-headedness, numbness and headaches.  Hematological: Negative for adenopathy. Does not bruise/bleed easily.  Psychiatric/Behavioral: Negative for hallucinations, behavioral problems, confusion, dysphoric mood, decreased concentration and agitation.    Allergies  Review of patient's allergies indicates no known allergies.  Home Medications   Current Outpatient Rx  Name  Route  Sig  Dispense  Refill  . amLODipine (NORVASC) 10 MG tablet   Oral   Take 1 tablet (10 mg total) by mouth daily.   30 tablet   5   . amoxicillin (AMOXIL) 500 MG tablet   Oral   Take 1 tablet (500 mg total) by mouth 2 (two) times daily.   14 tablet   0   . dextromethorphan 15 MG/5ML syrup   Oral   Take 10 mLs (30 mg total) by mouth 4 (four) times daily as needed for cough.   120 mL   0   . lisinopril (PRINIVIL,ZESTRIL) 20 MG tablet   Oral   Take 1 tablet (20 mg total) by mouth daily. Medications verified by Dayton Scrape the pharmacist at [facility]   30 tablet   5     BP 184/103  Pulse 86  Temp(Src) 98.5 F (36.9 C) (Oral)  SpO2 100%  Physical Exam  Constitutional: Appears well-developed and well-nourished. No distress.  HENT: Normocephalic. External right and left ear normal. Oropharynx is clear and moist.  frontal and maxillary sinus tenderness, nasal congestion noted on exam Eyes: Conjunctivae and EOM are normal. PERRLA, no scleral icterus.  Neck: Normal ROM. Neck supple. No JVD. No  tracheal deviation. No thyromegaly.  CVS: RRR, S1/S2 +, no murmurs, no gallops, no carotid bruit.  Pulmonary: Effort and breath sounds normal, no stridor, rhonchi, wheezes, rales.  Abdominal: Soft. BS +,  no distension, tenderness, rebound or guarding.  Musculoskeletal: Normal range of motion. No edema and no  tenderness.  Lymphadenopathy: No lymphadenopathy noted, cervical, inguinal. Neuro: Alert. Normal reflexes, muscle tone coordination. No cranial nerve deficit. Skin: Skin is warm and dry. No rash noted. Not diaphoretic. No erythema. No pallor.  Psychiatric: Normal mood and affect. Behavior, judgment, thought content normal.    ED Course  Procedures (including critical care time)  Labs Reviewed - No data to display No results found.   1. HYPERTENSION, BENIGN ESSENTIAL   2. Sinus infection    SINUS INFECTION: - For sinus infection we will go ahead and prescribe a course of amoxicillin for 7 days - Patient advised to wash hands regularly and to come back and see [place] if needed, if her symptoms get worse or do not improve with current medication regimen  HTN:   - blood pressure is elevated on today's exam, patient reports not taking blood pressure medicines since she has not had any refills - Will provide refill on her blood pressure medicine, I have advised patient to call us back if the numbers are persistently higher than 140/90 today we can readjust her regimen as indicated   MDM  HTN, uncontrolled         Dorothea Ogle, MD 09/02/12 [phone]

## 2012-09-02 NOTE — ED Notes (Signed)
Patient has history of HTN Needs medication refill

## 2012-10-23 ENCOUNTER — Emergency Department (INDEPENDENT_AMBULATORY_CARE_PROVIDER_SITE_OTHER)
Admission: EM | Admit: 2012-10-23 | Discharge: 2012-10-23 | Disposition: A | Discharge status: Home / Self Care | Payer: Self-pay | Source: Home / Self Care | Attending: Emergency Medicine | Admitting: Emergency Medicine

## 2012-10-23 ENCOUNTER — Encounter (HOSPITAL_COMMUNITY): Payer: Self-pay | Admitting: *Deleted

## 2012-10-23 DIAGNOSIS — K209 Esophagitis, unspecified without bleeding: Secondary | ICD-10-CM

## 2012-10-23 DIAGNOSIS — R0789 Other chest pain: Secondary | ICD-10-CM

## 2012-10-23 MED ORDER — OMEPRAZOLE 20 MG PO CPDR: 40.0000 mg | DELAYED_RELEASE_CAPSULE | Freq: Every day | ORAL | Status: DC

## 2012-10-23 NOTE — ED Provider Notes (Signed)
History     CSN: [id number]  Arrival date & time 10/23/12  1413   First MD Initiated Contact with Patient 10/23/12 1433      Chief Complaint  Patient presents with  . Chest Pain    (Consider location/radiation/quality/duration/timing/severity/associated sxs/prior treatment) HPI Comments: Patient presents urgent care complaining of substernal chest discomfort described as " feels like I have gas", points to upper retrosternal region. Patient also describes that the discomfort exacerbated or started at work as she is lifting boxes that weighed about 25 pounds. At one point felt short of breath. Denies any nausea, vomiting, diaphoresis, " it's not pain but feels more like gas" " I have been feeling this for many weeks and sometimes I take baking soda".... Patient denies any palpitations, dizziness, or headache, no paresthesias. Patient does drink coffee frequently. Denies any respiratory symptoms such as cough congestion or wheezing.  Patient is a 47 y.o. female presenting with chest pain. The history is provided by the patient.  Chest Pain Pain location:  Substernal area Pain quality: pressure   Pain quality: not shooting and no tightness   Pain radiates to:  Precordial region Pain radiates to the back: no   Pain severity:  Moderate Onset quality:  Gradual Timing:  Constant Progression:  Worsening Chronicity:  New Worsened by:  Nothing tried Associated symptoms: no abdominal pain, no anorexia, no back pain, no claudication, no cough, no diaphoresis, no dizziness, no fatigue, no fever, no headache, no nausea, no near-syncope, no palpitations, not vomiting and no weakness   Risk factors: no aortic disease, no birth control, no coronary artery disease, no diabetes mellitus, not pregnant, no prior DVT/PE and no surgery     Past Medical History  Diagnosis Date  . Hypertension     History reviewed. No pertinent past surgical history.  No family history on file.  History   Substance Use Topics  . Smoking status: Never Smoker   . Smokeless tobacco: Not on file  . Alcohol Use: No    OB History   Grav Para Term Preterm Abortions TAB SAB Ect Mult Living                  Review of Systems  Constitutional: Negative for fever, chills, diaphoresis, activity change, appetite change, fatigue and unexpected weight change.  Respiratory: Negative for cough.   Cardiovascular: Positive for chest pain. Negative for palpitations, claudication and near-syncope.  Gastrointestinal: Negative for nausea, vomiting, abdominal pain and anorexia.  Musculoskeletal: Negative for back pain.  Skin: Negative for color change, pallor and rash.  Neurological: Negative for dizziness, weakness and headaches.    Allergies  Review of patient's allergies indicates no known allergies.  Home Medications   Current Outpatient Rx  Name  Route  Sig  Dispense  Refill  . amLODipine (NORVASC) 10 MG tablet   Oral   Take 1 tablet (10 mg total) by mouth daily.   30 tablet   5   . dextromethorphan 15 MG/5ML syrup   Oral   Take 10 mLs (30 mg total) by mouth 4 (four) times daily as needed for cough.   120 mL   0   . lisinopril (PRINIVIL,ZESTRIL) 20 MG tablet   Oral   Take 1 tablet (20 mg total) by mouth daily. Medications verified by Dayton Scrape [facility]ist at [facility]   30 tablet   5   . omeprazole (PRILOSEC) 20 MG capsule   Oral   Take 2 capsules (40 mg total) by  mouth daily.   30 capsule   0     BP 112/69  Pulse 87  Temp(Src) 98.6 F (37 C) (Oral)  Resp 16  SpO2 100%  Physical Exam  Nursing note and vitals reviewed. Constitutional: Vital signs are normal. She appears well-developed and well-nourished.  Non-toxic appearance. She does not have a sickly appearance. No distress.  HENT:  Head: Normocephalic.  Eyes: Conjunctivae are normal.  Neck: Neck supple. No JVD present.  Cardiovascular: Normal rate, regular rhythm and normal pulses.  Exam reveals no gallop and no  friction rub.   No murmur heard. Pulmonary/Chest: Effort normal and breath sounds normal. She has no decreased breath sounds. She has no wheezes. She has no rhonchi.  Abdominal: Soft. She exhibits no distension. There is no tenderness. There is no rebound and no guarding.  Musculoskeletal: She exhibits tenderness.  Neurological: She is alert.  Skin: No rash noted. No erythema.    ED Course  Procedures (including critical care time)  Labs Reviewed - No data to display No results found.   1. Esophagitis, unspecified   2. Chest pain, atypical     EKG normal sinus rhythm ventricular rate of 78 beats per minute. No PR QRS prolongation, no ST or T wave inversions elevations or depressions to suggest acute ischemia. Unremarkable EKG.  MDM  At time of exam patient was describing a basic discomfort to a mid retrosternal region. Denies any pain or further symptomatology, normal EKG. At this point exam and symptoms and remote history were most consistent with esophagitis, then an acute coronary event have instructed patient to take a proton pump inhibitor for the next 2-4 weeks. We discussed that if no improvement or worsening symptoms or new symptoms to go to the emergency department for further evaluation. I have discussed with patient that recurrent symptoms were more of consistent with esophagitis but if no improvement she should go to the emergency department as I cannot fully exclude a subacute coronary event or impending process. She agrees and wants to go home and will start with Prilosec and will avoid drinking coffee for several days.        Jimmie Molly, MD 10/23/12 [phone]

## 2012-10-23 NOTE — ED Notes (Signed)
Pt  Reports    Symptoms  Of    Chest  Pain  When  Moving  As  Well  As   Shortness  Of  Breath     -    Symptoms  Started  This  Am    denys  Any  Injury     -  She  Is  Speaking in  Complete  sentances      And  Is  In no  Severe  Distress   She  Ambulated  To  Room with a  Steady fluid  gait

## 2012-10-28 NOTE — ED Notes (Signed)
Pt  Arrived  Requesting a  Work  Note   Releasing  Her  Back  To  Work  Dr  Ladon Applebaum  Notified   Pt  Can have  72  Hours  From  10/23/2012

## 2012-10-31 ENCOUNTER — Emergency Department (HOSPITAL_COMMUNITY): Payer: Self-pay

## 2012-10-31 ENCOUNTER — Inpatient Hospital Stay (HOSPITAL_COMMUNITY)
Admission: EM | Admit: 2012-10-31 | Discharge: 2012-11-04 | DRG: 392 | Disposition: A | Discharge status: Home / Self Care | Payer: MEDICAID | Attending: Internal Medicine | Admitting: Internal Medicine

## 2012-10-31 ENCOUNTER — Encounter (HOSPITAL_COMMUNITY): Payer: Self-pay | Admitting: Emergency Medicine

## 2012-10-31 DIAGNOSIS — H9190 Unspecified hearing loss, unspecified ear: Secondary | ICD-10-CM | POA: Diagnosis present

## 2012-10-31 DIAGNOSIS — I959 Hypotension, unspecified: Secondary | ICD-10-CM | POA: Diagnosis present

## 2012-10-31 DIAGNOSIS — K209 Esophagitis, unspecified without bleeding: Secondary | ICD-10-CM

## 2012-10-31 DIAGNOSIS — K2980 Duodenitis without bleeding: Secondary | ICD-10-CM | POA: Diagnosis present

## 2012-10-31 DIAGNOSIS — F329 Major depressive disorder, single episode, unspecified: Secondary | ICD-10-CM | POA: Diagnosis present

## 2012-10-31 DIAGNOSIS — F3289 Other specified depressive episodes: Secondary | ICD-10-CM | POA: Diagnosis present

## 2012-10-31 DIAGNOSIS — D649 Anemia, unspecified: Secondary | ICD-10-CM

## 2012-10-31 DIAGNOSIS — D62 Acute posthemorrhagic anemia: Secondary | ICD-10-CM | POA: Diagnosis present

## 2012-10-31 DIAGNOSIS — Z8522 Personal history of malignant neoplasm of nasal cavities, middle ear, and accessory sinuses: Secondary | ICD-10-CM

## 2012-10-31 DIAGNOSIS — I951 Orthostatic hypotension: Secondary | ICD-10-CM

## 2012-10-31 DIAGNOSIS — D7589 Other specified diseases of blood and blood-forming organs: Secondary | ICD-10-CM | POA: Diagnosis present

## 2012-10-31 DIAGNOSIS — N179 Acute kidney failure, unspecified: Secondary | ICD-10-CM | POA: Diagnosis present

## 2012-10-31 DIAGNOSIS — Z859 Personal history of malignant neoplasm, unspecified: Secondary | ICD-10-CM

## 2012-10-31 DIAGNOSIS — K2960 Other gastritis without bleeding: Secondary | ICD-10-CM | POA: Diagnosis present

## 2012-10-31 DIAGNOSIS — R1310 Dysphagia, unspecified: Secondary | ICD-10-CM | POA: Diagnosis present

## 2012-10-31 DIAGNOSIS — D509 Iron deficiency anemia, unspecified: Secondary | ICD-10-CM | POA: Diagnosis present

## 2012-10-31 DIAGNOSIS — N912 Amenorrhea, unspecified: Secondary | ICD-10-CM | POA: Diagnosis present

## 2012-10-31 DIAGNOSIS — I10 Essential (primary) hypertension: Secondary | ICD-10-CM | POA: Diagnosis present

## 2012-10-31 DIAGNOSIS — K219 Gastro-esophageal reflux disease without esophagitis: Secondary | ICD-10-CM | POA: Diagnosis present

## 2012-10-31 DIAGNOSIS — Z7982 Long term (current) use of aspirin: Secondary | ICD-10-CM

## 2012-10-31 HISTORY — DX: Sensorineural hearing loss, bilateral: H90.3

## 2012-10-31 HISTORY — DX: Occlusion and stenosis of left carotid artery: I65.22

## 2012-10-31 HISTORY — DX: Malignant neoplasm of maxillary sinus: C31.0

## 2012-10-31 HISTORY — DX: Herpesviral infection of urogenital system, unspecified: A60.00

## 2012-10-31 LAB — DIFFERENTIAL
Basophils Absolute: 0.1 10*3/uL (ref 0.0–0.1)
Basophils Relative: 0 % (ref 0–1)
Eosinophils Absolute: 0.5 10*3/uL (ref 0.0–0.7)
Eosinophils Relative: 3 % (ref 0–5)
Monocytes Absolute: 0.7 10*3/uL (ref 0.1–1.0)
Monocytes Relative: 5 % (ref 3–12)
Neutro Abs: 9.5 10*3/uL — ABNORMAL HIGH (ref 1.7–7.7)

## 2012-10-31 LAB — CBC
HCT: 19.3 % — ABNORMAL LOW (ref 36.0–46.0)
Hemoglobin: 5.4 g/dL — CL (ref 12.0–15.0)
MCH: 18.4 pg — ABNORMAL LOW (ref 26.0–34.0)
MCV: 65.6 fL — ABNORMAL LOW (ref 78.0–100.0)
Platelets: 562 10*3/uL — ABNORMAL HIGH (ref 150–400)
RBC: 2.94 MIL/uL — ABNORMAL LOW (ref 3.87–5.11)
WBC: 14.2 10*3/uL — ABNORMAL HIGH (ref 4.0–10.5)

## 2012-10-31 LAB — LACTATE DEHYDROGENASE: LDH: 230 U/L (ref 94–250)

## 2012-10-31 LAB — HEPATIC FUNCTION PANEL
ALT: 9 U/L (ref 0–35)
AST: 19 U/L (ref 0–37)
Albumin: 3.9 g/dL (ref 3.5–5.2)
Alkaline Phosphatase: 71 U/L (ref 39–117)
Bilirubin, Direct: <0.1 mg/dL (ref 0.0–0.3)
Total Bilirubin: 0.1 mg/dL — ABNORMAL LOW (ref 0.3–1.2)

## 2012-10-31 LAB — IRON AND TIBC
Iron: 10 ug/dL — ABNORMAL LOW (ref 42–135)
Saturation Ratios: 2 % — ABNORMAL LOW (ref 20–55)
UIBC: 439 ug/dL — ABNORMAL HIGH (ref 125–400)

## 2012-10-31 LAB — PREPARE RBC (CROSSMATCH)

## 2012-10-31 LAB — BASIC METABOLIC PANEL
CO2: 17 mEq/L — ABNORMAL LOW (ref 19–32)
Chloride: 101 mEq/L (ref 96–112)
Creatinine, Ser: 1.57 mg/dL — ABNORMAL HIGH (ref 0.50–1.10)
Glucose, Bld: 88 mg/dL (ref 70–99)

## 2012-10-31 LAB — RETICULOCYTES: Retic Count, Absolute: 39.8 10*3/uL (ref 19.0–186.0)

## 2012-10-31 LAB — FOLATE: Folate: 19.2 ng/mL

## 2012-10-31 LAB — FERRITIN: Ferritin: 2 ng/mL — ABNORMAL LOW (ref 10–291)

## 2012-10-31 MED ORDER — MECLIZINE HCL 25 MG PO TABS
25.0000 mg | ORAL_TABLET | Freq: Once | ORAL | Status: AC
  Administered 2012-10-31: 25 mg via ORAL
  Filled 2012-10-31: qty 1

## 2012-10-31 MED ORDER — PEG 3350-KCL-NA BICARB-NACL 420 G PO SOLR
4000.0000 mL | Freq: Once | ORAL | Status: DC
  Filled 2012-10-31: qty 4000

## 2012-10-31 MED ORDER — SODIUM CHLORIDE 0.9 % IV SOLN
INTRAVENOUS | Status: DC
  Administered 2012-10-31 – 2012-11-02 (×4): via INTRAVENOUS

## 2012-10-31 MED ORDER — SODIUM CHLORIDE 0.9 % IJ SOLN
3.0000 mL | Freq: Two times a day (BID) | INTRAMUSCULAR | Status: DC
  Administered 2012-11-02 – 2012-11-04 (×4): 3 mL via INTRAVENOUS

## 2012-10-31 MED ORDER — PANTOPRAZOLE SODIUM 40 MG IV SOLR
40.0000 mg | Freq: Two times a day (BID) | INTRAVENOUS | Status: DC
  Administered 2012-11-01: 40 mg via INTRAVENOUS
  Filled 2012-10-31 (×3): qty 40

## 2012-10-31 MED ORDER — SODIUM CHLORIDE 0.9 % IV SOLN: INTRAVENOUS | Status: DC

## 2012-10-31 MED ORDER — LORAZEPAM 1 MG PO TABS
1.0000 mg | ORAL_TABLET | Freq: Once | ORAL | Status: AC
  Administered 2012-10-31: 1 mg via ORAL
  Filled 2012-10-31: qty 1

## 2012-10-31 NOTE — H&P (Signed)
[facility] Note Date: 10/31/2012  Patient name: Stacy Anderson Medical record number: [id number] Date of birth: May 17, 1966 Age: 47 y.o. Gender: female PCP: No PCP Per Patient  Medical Service: Internal Medicine (B1)  Attending physician:  Dr. 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  She reports she has had dizziness for several weeks.  She also c/o dysphagia to liquids and solids and substernal chest pain, radiating to precordial region.  She was seen in the ED on 10/23/12 for her atypical chest pain and discharged with instructions to take Prilosec.   She endorses taking 800 mg of ibuprofen BID x 6-7 mo for joint pain.  She reports feeling food "get caught" in her esophagus and endorses improvement with PO intake and prilosec.  She endorses a decrease in appetite and states she eats breakfast daily (oatmeal) and occasionally eats dinner.  She also endorses blurry vision ("white spots"), nonproductive cough, and constant nausea.  She has never had a colonoscopy or EGD.  Reports normal BMs (1/week) and denies blood in stool, vomit, or on toilet paper.  She has not menstruated in 8 months but reports she had heavy periods before she stopped menstruating.  Denies vaginal bleeding.  Denies recent trauma and weight loss.  In the ED today, her Hgb was found to be 5.4 and received 2 U of RBCs.   Meds: Current Outpatient Rx  Name  Route  Sig  Dispense  Refill  . amLODipine (NORVASC) 10 MG tablet   Oral   Take 1 tablet (10 mg total) by mouth daily.   30 tablet   5   . Artificial Saliva (BIOTENE MOISTURIZING MOUTH) SOLN   Mouth/Throat   Use as directed 1-2 sprays in the mouth or throat daily as needed (dryness).         . Aspirin-Caffeine  500-32 MG TABS   Oral   Take 2 tablets by mouth daily as needed (pain).         Marland Kitchen lisinopril (PRINIVIL,ZESTRIL) 20 MG tablet   Oral   Take 1 tablet (20 mg total) by mouth daily. Medications verified by Dayton Scrape the [facility]ist at [facility]   30 tablet   5   . omeprazole (PRILOSEC) 20 MG capsule   Oral   Take 2 capsules (40 mg total) by mouth daily.   30 capsule   0     Allergies: Allergies as of 10/31/2012  . (No Known Allergies)   Past Medical History  Diagnosis Date  . Hypertension   . Sensorineural hearing loss of both ears     likely secondary to the radiation therapy, chemotherapy and the vascular flow issues  . Left carotid artery stenosis   . 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 Herpes genitalis 12/30/2008   Past Surgical History  Procedure Laterality Date  . Tubal ligation Bilateral 04/03/2003    Dr. Sylvester Harder  . Exploratory tympanotomy Right 06/05/2007    Hermelinda Medicus, M.D.   History reviewed. No pertinent family history. History   Social History  . Marital Status: Single    Spouse Name: N/A    Number of Children: N/A  . Years of Education: N/A   Occupational  History  . Not on file.   Social History Main Topics  . Smoking status: Never Smoker   . Smokeless tobacco: Not on file  . Alcohol Use: No  . Drug Use: Not on file  . Sexually Active: Not on file   Other Topics Concern  . Not on file   Social History Narrative  . No narrative on file    SoHx: smokes cigars 1/wk x 2-3 yrs; Lives in [place], [place] with a friend.  Employed as a Copywriter, advertising.    FamHx: mother HTN; father DM; PGF HTN.  No family history of bleeding problems or colorectal cancer.   Review of Systems: A comprehensive review of systems was negative except for: per HPI.  Physical Exam: Blood pressure 88/56, pulse 107, temperature 98.3 F (36.8 C), temperature source Oral, resp. rate 17, height 5\' 8"  (1.727  m), weight 77.111 kg (170 lb), last menstrual period 05/17/2012, SpO2 100.00%. BP 143/88  Pulse 93  Temp(Src) 98.9 F (37.2 C) (Oral)  Resp 18  Ht 5\' 8"  (1.727 m)  Wt 77.111 kg (170 lb)  BMI 25.85 kg/m2  SpO2 97%  LMP 05/17/2012 General appearance: alert, cooperative, appears older than stated age and no distress Head: Normocephalic, without obvious abnormality, atraumatic, sinuses nontender to percussion Eyes: EOMI.  Sclera midly icteric. Ears: hearing aids in place bilaterally Nose: deviated septum Throat: frothy white secretions Neck: no adenopathy, no JVD, supple, symmetrical, trachea midline and thyroid not enlarged, symmetric, no tenderness/mass/nodules Lungs: clear to auscultation bilaterally Heart: regular rate and rhythm, S1, S2 normal, no murmur, click, rub or gallop Abdomen: soft, non-tender; bowel sounds normal; no masses,  no organomegaly Rectal: stool in vault, no masses palpated Extremities: extremities normal, atraumatic, no cyanosis or edema Pulses: 2+ and symmetric Skin: Skin color, texture, turgor normal. No rashes or lesions Neurologic: Grossly normal  Lab results: Basic Metabolic Panel:  Recent Labs  16/10/96 1113  NA 130*  K 4.2  CL 101  CO2 17*  GLUCOSE 88  BUN 22  CREATININE 1.57*  CALCIUM 9.7   Liver Function Tests: No results found for this basename: AST, ALT, ALKPHOS, BILITOT, PROT, ALBUMIN,  in the last 72 hours No results found for this basename: LIPASE, AMYLASE,  in the last 72 hours No results found for this basename: AMMONIA,  in the last 72 hours CBC:  Recent Labs  10/31/12 1113 10/31/12 1530  WBC 14.2*  --   NEUTROABS  --  9.5*  HGB 5.4*  --   HCT 19.3*  --   MCV 65.6*  --   PLT 562*  --    Cardiac Enzymes: No results found for this basename: CKTOTAL, CKMB, CKMBINDEX, TROPONINI,  in the last 72 hours BNP: No results found for this basename: PROBNP,  in the last 72 hours D-Dimer: No results found for this basename:  DDIMER,  in the last 72 hours CBG: No results found for this basename: GLUCAP,  in the last 72 hours Hemoglobin A1C: No results found for this basename: HGBA1C,  in the last 72 hours Fasting Lipid Panel: No results found for this basename: CHOL, HDL, LDLCALC, TRIG, CHOLHDL, LDLDIRECT,  in the last 72 hours Thyroid Function Tests: No results found for this basename: TSH, T4TOTAL, FREET4, T3FREE, THYROIDAB,  in the last 72 hours Anemia Panel:  Recent Labs  10/31/12 0810  RETICCTPCT 1.3   Coagulation: No results found for this basename: LABPROT, INR,  in the last 72 hours Urine Drug Screen:  Drugs of Abuse  No results found for this basename: labopia, cocainscrnur, labbenz, amphetmu, thcu, labbarb    Alcohol Level: No results found for this basename: ETH,  in the last 72 hours Urinalysis: No results found for this basename: COLORURINE, APPERANCEUR, LABSPEC, PHURINE, GLUCOSEU, HGBUR, BILIRUBINUR, KETONESUR, PROTEINUR, UROBILINOGEN, NITRITE, LEUKOCYTESUR,  in the last 72 hours  Imaging results:  Ct Head Wo Contrast  10/31/2012   *RADIOLOGY REPORT*  Clinical Data: Dizziness  CT HEAD WITHOUT CONTRAST  Technique:  Contiguous axial images were obtained from the base of the skull through the vertex without contrast.  Comparison: MRI 01/10/2007.  CT temporal bone 01/04/2007  Findings: Ventricle size is normal.  Negative for acute infarct, hemorrhage, or mass lesion.  No edema or midline shift.  Sclerotic changes are present in the skull base, similar to 2008. Question radiation change or fibrous dysplasia.   There is also chronic sinusitis with mucosal edema and bony thickening of the maxillary sinus bilaterally.  Mastoid sinus is clear bilaterally.  IMPRESSION: No acute intracranial abnormality.  Sclerotic changes in the skull base unchanged.  Question fibrous dysplasia or post radiation change.  The patient has a history of neoplasm of the nasal cavity.   Original Report Authenticated By: Janeece Riggers, M.D.    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  Principal Problem:   Microcytic anemia Active Problems:   HEARING LOSS   HYPERTENSION, BENIGN ESSENTIAL  Microcytic Anemia -Admit to internal medicine (B1). -Etiology unclear at this time, possibly Fe deficiency given h/o poor PO intake.  Cannot r/o GI bleed or malignancy as cause though pt does not endorse blood in stool or vomit.  Reticulocyte index low at 0.22. Lab values do not support hemolytic anemia picture.   -Orthostatic VS.  S/p 2 U RBCs.  Recheck CBC after transfusion complete. -NS @ 150 cc/hr. -Maintain active T&S.   -CBC q6h. -Fecal occult blood negative. Will discuss need for colonoscopy with GI.   -Hepatoglobin, vit B12, folate, Fe, TIBC, ferritin levels pending.  Continue to follow. -Low Tbili (0.1) but otherwise hepatic function panel wnl. -Check TSH to r/o HPA axis damage from previous radiation.    Dysphagia  -Etiology possibly 2/2 NSAID induced esophagitis.   -GI consult.  Appreciate recs. -NPO for likely EGD. -IV protonix 40 mg q12h. -CMP in AM.  HTN -Orthostatic but HDS.  Cardiac monitoring. -Holding home meds given concern for GI bleed.  -Repeat EKG in AM.  Access: R AC PIV (NS 10/31/12)  Code: full  Prophylaxis: SCDs  Dispo: floor status -Anticipated date of discharge: 1-3 days  This is a Psychologist, occupational Note.  The care of the patient was discussed with Dr. Dorma Russell and the assessment and plan was formulated with their assistance.  Please see their note for official documentation of the patient encounter.   Signed: Derwood Kaplan 10/31/2012, 4:01 PM

## 2012-10-31 NOTE — H&P (Signed)
Internal Medicine Teaching Service Resident Admission Note Date: 10/31/2012  Patient name: Stacy Anderson Medical record number: [id number] Date of birth: April 16, 1966 Age: 47 y.o. Gender: female PCP: No PCP Per Patient  Medical Service:  I have reviewed the note by Harland Dingwall, MS-3 and was present during the interview and physical exam.  Please see my separate note for findings, assessment, and plan.   Signed: Genelle Gather 10/31/2012, 5:09 PM

## 2012-10-31 NOTE — ED Notes (Signed)
Patient transported to CT 

## 2012-10-31 NOTE — ED Notes (Signed)
PT states she has been SOB since 8:00 this morning and has felt very dizzy. Pt was hyperventilating upon arrival. Able to calm the pt down some pt respirations are now 20 per minute. Pt denies any cardiac history.

## 2012-10-31 NOTE — H&P (Signed)
Hospital Admission Note Date: 10/31/2012  Patient name: Stacy Anderson Medical record number: [id number] Date of birth: Feb 07, 1966 Age: 47 y.o. Gender: female PCP: No PCP Per Patient  Medical Service: Internal Medicine Teaching Service  Attending physician:  Dr. Criselda Peaches   1st Contact: Dr. Sherrine Maples   Pager: [phone] 2nd Contact: Dr. Everardo Beals   Pager: [phone] After 5 pm or weekends: 1st Contact:      Pager: [phone] 2nd Contact:      Pager: [phone]  Chief Complaint: Dizziness and SOB  History of Present Illness: 47yo F with PMH nasal tumor s/p chemo/rad at the age of 47, hearing loss 2/2 cancer therapy, HTN, depression, and GERD who presents to the ED with dizziness and SOB x 1-2 weeks worse with exertion, that has worsened over the past 24hrs.   She has a h/o of anemia of unknown etiology with no previous GI work up. 3/11 she was seen in the ED for a medication refill and was incidentally found to have a Hgb of 7.7. She was completely asymptomatic at this time, and was told to f/u with her PCP for a complete workup which was not done.  She was recently seen in the ED on 10/23/12 for chest pain and dysphagia. No labs were performed, but she was diagnosed with esophagitis and prescribed Prilosec and told to avoid coffee.   Today in the ED, she denies blood in her stool or on the toilet paper, black tarry stools, hematemesis or coffee ground emesis, no family or personal h/o of bleeding d/o, recent trauma, or having a menstrual period in 8 months. She does endorse constant heartburn and nausea that is improved with the Prilosec prescribed at her last ED visit, but her sx are not completely resolved. She is also still experiencing dysphagia. She does c/o of vision changes, a "white out" effect, with her dizziness that are improved with sunglasses.   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 She endorses taking 800mg  of Ibuprofen twice a day x  6-65months.   She does smoke 1 cigar a week over the past 2 ys but denies any alcohol or drug use.  Meds: Current Outpatient Rx  Name  Route  Sig  Dispense  Refill  . amLODipine (NORVASC) 10 MG tablet   Oral   Take 1 tablet (10 mg total) by mouth daily.   30 tablet   5   . Artificial Saliva (BIOTENE MOISTURIZING MOUTH) SOLN   Mouth/Throat   Use as directed 1-2 sprays in the mouth or throat daily as needed (dryness).         . Aspirin-Caffeine 500-32 MG TABS   Oral   Take 2 tablets by mouth daily as needed (pain).         Marland Kitchen lisinopril (PRINIVIL,ZESTRIL) 20 MG tablet   Oral   Take 1 tablet (20 mg total) by mouth daily. Medications verified by Dayton Scrape [facility]ist at [facility]   30 tablet   5   . omeprazole (PRILOSEC) 20 MG capsule   Oral   Take 2 capsules (40 mg total) by mouth daily.   30 capsule   0     Allergies: Allergies as of 10/31/2012  . (No Known Allergies)   Past Medical History  Diagnosis Date  . Hypertension   . Sensorineural hearing loss of both ears     likely secondary to the radiation therapy, chemotherapy and the  vascular flow issues  . Left carotid artery stenosis   . 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 Herpes genitalis 12/30/2008   Past Surgical History  Procedure Laterality Date  . Tubal ligation Bilateral 04/03/2003    Dr. Sylvester Harder  . Exploratory tympanotomy Right 06/05/2007    Hermelinda Medicus, M.D.   History reviewed. No pertinent family history. History   Social History  . Marital Status: Single    Spouse Name: N/A    Number of Children: N/A  . Years of Education: N/A   Occupational History  . Not on file.   Social History Main Topics  . Smoking status: Never Smoker   . Smokeless tobacco: Not on file  . Alcohol Use: No  . Drug Use: Not on file  . Sexually Active: Not on file   Other Topics Concern  . Not on file   Social History Narrative  . No narrative on  file    Review of Systems: A 10 point ROS was performed; pertinent positives and negatives were noted in the HPI  Physical Exam: Blood pressure 126/82, pulse 88, temperature 98.6 F (37 C), temperature source Oral, resp. rate 18, height 5\' 8"  (1.727 m), weight 170 lb (77.111 kg), last menstrual period 05/17/2012, SpO2 97.00%. General: Well-developed, and cooperative on examination.  Head: Normocephalic and atraumatic.  Eyes: Pupils equal, round, and reactive to light, no injection and anicteric. Conjunctiva pale Mouth: Pharynx pink with thick saliva, no exudates.  Neck: Supple, full ROM, no thyromegaly, no JVD, and no carotid bruits.  Lungs: CTAB, normal respiratory effort, no accessory muscle use, no crackles, and no wheezes. Heart: Regular rate, regular rhythm, no murmur, no gallop, and no rub.  Abdomen: Soft, non-tender, non-distended, normal bowel sounds, no guarding, no rebound tenderness, no organomegaly.  Msk: No joint swelling, warmth, or erythema. Slow capillary refill. Extremities: 2+ radial and DP pulses bilaterally. No cyanosis, clubbing, edema Neurologic: Alert & oriented X3, cranial nerves II-XII intact- hearing grossly intact with hearing aids in both ears and required to speak in an elevated voice, strength normal in all extremities, sensation intact to light touch. Skin: Turgor normal and no rashes.  Psych: Normal mood and affect    Lab results: Basic Metabolic Panel:  Recent Labs  16/10/96 1113  NA 130*  K 4.2  CL 101  CO2 17*  GLUCOSE 88  BUN 22  CREATININE 1.57*  CALCIUM 9.7   CBC:  Recent Labs  10/31/12 1113 10/31/12 1530  WBC 14.2*  --   NEUTROABS  --  9.5*  HGB 5.4*  --   HCT 19.3*  --   MCV 65.6*  --   PLT 562*  --    Anemia Panel:  Recent Labs  10/31/12 0810  RETICCTPCT 1.3   Misc Labs: 10/31/12: Hemoccult negative  Imaging results:  Ct Head Wo Contrast  10/31/2012   *RADIOLOGY REPORT*  Clinical Data: Dizziness  CT HEAD WITHOUT  CONTRAST  Technique:  Contiguous axial images were obtained from the base of the skull through the vertex without contrast.  Comparison: MRI 01/10/2007.  CT temporal bone 01/04/2007  Findings: Ventricle size is normal.  Negative for acute infarct, hemorrhage, or mass lesion.  No edema or midline shift.  Sclerotic changes are present in the skull base, similar to 2008. Question radiation change or fibrous dysplasia.   There is also chronic sinusitis with mucosal edema and bony thickening of the maxillary sinus bilaterally.  Mastoid sinus is clear bilaterally.  IMPRESSION: No acute intracranial abnormality.  Sclerotic changes in the skull base unchanged.  Question fibrous dysplasia or post radiation change.  The patient has a history of neoplasm of the nasal cavity.   Original Report Authenticated By: Janeece Riggers, M.D.    Other results: EKG: Normal sinus rhythm, rate 94, right axis deviation, QTc 477, nonspecific ST wave changes.   Assessment & Plan by Problem: 47yo F with PMH nasal tumor s/p chemo/rad at the age of 4, hearing loss 2/2 cancer therapy, HTN, depression, and GERD who presents with significant microcytic anemia unknown etiology.   Microcytic anemia: Hgb 5.4 on admission with MCV of 65.6. No previous GI workup. Hemoccult negative on admission, and she denies any blood in her stool, hematemesis, or active menstrual bleeding. She does have poor po intake and reports usually eating only oatmeal and coffee at breakfast and possibly chicken and salad if she eats dinner. She does have 2-5 schistocytes on peripheral smear, but her reticulocyte production index in <2% with normal LDH, and total bili is low which points more toward underproduction 2/2 microcytic anemia. Cannot r/o GI bleed or malignancy, but hemoccult is negative in the ED. Likely that her microcytic anemia is 2/2 to iron deficiency given poor po intake. Also with orthostatic vital signs, likely due to volume depletion from her anemia  and poor po intake.  - Admit to IMTS to telemetry - Transfusing 2u pRBCs - Post tx CBC, then q6h - Stool occult blood cards x3 - Protonix 40mg  IV q12h - NPO - GI consult, appreciate recommendations - Checking Hepatoglobin, vit B12, folate, Fe, TIBC, ferritin  - Rechecking EKG in the am  Hyponatremia: H/o chronic hyponatremia with Na as low as 127 and no higher than 133. Na 130 in the ED this admission. Likely 2/2 poor po intake and dehydration.  - AM CMP  HTN: She is on Amlodipine and Lisinopril as an outpatient. BP labile in the ED, also with orthostatic hypotension. Holding BP meds due to her significant amenia and hypotension.   Dysphagia:  Likely 2/2 esophagitis from NSAID use - GI consult, appreciate recs.  - NPO for possible EGD.  - IV Protonix 40 mg q12h.  - CMP in AM.  DVT PPx: SCDs   Dispo: Disposition is deferred at this time, awaiting improvement of current medical problems. Anticipated discharge in approximately 1-3 day(s).   The patient does not have a current PCP (No PCP Per Patient), therefore will possibly be requiring OPC follow-up after discharge.   The patient does not have transportation limitations that hinder transportation to clinic appointments.  Signed: Genelle Gather 10/31/2012, 4:17 PM

## 2012-10-31 NOTE — ED Provider Notes (Signed)
History     CSN: [id number]  Arrival date & time 10/31/12  1056   First MD Initiated Contact with Patient 10/31/12 1121      Chief Complaint  Patient presents with  . Shortness of Breath  . Dizziness    (Consider location/radiation/quality/duration/timing/severity/associated sxs/prior treatment) HPI... weakness, dizziness, sob for 2 weeks, worse the past 24 hours. Exertion makes symptoms worse. History of anemia in the past, but unknown etiology. No black stool or any other obvious source of bleeding. She wears hearing aid in both ears. Severity is moderate to severe.  Past Medical History  Diagnosis Date  . Hypertension     History reviewed. No pertinent past surgical history.  History reviewed. No pertinent family history.  History  Substance Use Topics  . Smoking status: Never Smoker   . Smokeless tobacco: Not on file  . Alcohol Use: No    OB History   Grav Para Term Preterm Abortions TAB SAB Ect Mult Living                  Review of Systems  All other systems reviewed and are negative.    Allergies  Review of patient's allergies indicates no known allergies.  Home Medications   Current Outpatient Rx  Name  Route  Sig  Dispense  Refill  . amLODipine (NORVASC) 10 MG tablet   Oral   Take 1 tablet (10 mg total) by mouth daily.   30 tablet   5   . Artificial Saliva (BIOTENE MOISTURIZING MOUTH) SOLN   Mouth/Throat   Use as directed 1-2 sprays in the mouth or throat daily as needed (dryness).         . Aspirin-Caffeine 500-32 MG TABS   Oral   Take 2 tablets by mouth daily as needed (pain).         Marland Kitchen lisinopril (PRINIVIL,ZESTRIL) 20 MG tablet   Oral   Take 1 tablet (20 mg total) by mouth daily. Medications verified by Dayton Scrape the pharmacist at [facility]   30 tablet   5   . omeprazole (PRILOSEC) 20 MG capsule   Oral   Take 2 capsules (40 mg total) by mouth daily.   30 capsule   0     BP 101/74  Pulse 90  Temp(Src) 98.3 F (36.8 C)  (Oral)  Resp 22  Ht 5\' 8"  (1.727 m)  Wt 170 lb (77.111 kg)  BMI 25.85 kg/m2  SpO2 96%  LMP 05/17/2012  Physical Exam  Nursing note and vitals reviewed. Constitutional: She is oriented to person, place, and time. She appears well-developed and well-nourished.  HENT:  Head: Normocephalic and atraumatic.  Eyes: Conjunctivae and EOM are normal. Pupils are equal, round, and reactive to light.  Neck: Normal range of motion. Neck supple.  Cardiovascular: Normal rate, regular rhythm and normal heart sounds.   Pulmonary/Chest: Effort normal and breath sounds normal.  Abdominal: Soft. Bowel sounds are normal.  Musculoskeletal: Normal range of motion.  Neurological: She is alert and oriented to person, place, and time.  Skin: Skin is warm and dry.  Psychiatric: She has a normal mood and affect.    ED Course  Procedures (including critical care time)  Labs Reviewed  CBC - Abnormal; Notable for the following:    WBC 14.2 (*)    RBC 2.94 (*)    Hemoglobin 5.4 (*)    HCT 19.3 (*)    MCV 65.6 (*)    MCH 18.4 (*)    MCHC  28.0 (*)    RDW 19.7 (*)    Platelets 562 (*)    All other components within normal limits  BASIC METABOLIC PANEL - Abnormal; Notable for the following:    Sodium 130 (*)    CO2 17 (*)    Creatinine, Ser 1.57 (*)    GFR calc non Af Amer 39 (*)    GFR calc Af Amer 45 (*)    All other components within normal limits  TECHNOLOGIST SMEAR REVIEW  LACTATE DEHYDROGENASE  HAPTOGLOBIN  TYPE AND SCREEN  PREPARE RBC (CROSSMATCH)   Ct Head Wo Contrast  10/31/2012   *RADIOLOGY REPORT*  Clinical Data: Dizziness  CT HEAD WITHOUT CONTRAST  Technique:  Contiguous axial images were obtained from the base of the skull through the vertex without contrast.  Comparison: MRI 01/10/2007.  CT temporal bone 01/04/2007  Findings: Ventricle size is normal.  Negative for acute infarct, hemorrhage, or mass lesion.  No edema or midline shift.  Sclerotic changes are present in the skull base,  similar to 2008. Question radiation change or fibrous dysplasia.   There is also chronic sinusitis with mucosal edema and bony thickening of the maxillary sinus bilaterally.  Mastoid sinus is clear bilaterally.  IMPRESSION: No acute intracranial abnormality.  Sclerotic changes in the skull base unchanged.  Question fibrous dysplasia or post radiation change.  The patient has a history of neoplasm of the nasal cavity.   Original Report Authenticated By: Janeece Riggers, M.D.     No diagnosis found.   Date: 10/31/2012  Rate: 94  Rhythm: normal sinus rhythm  QRS Axis: right  Intervals: normal  ST/T Wave abnormalities: normal  Conduction Disutrbances: none  Narrative Interpretation: unremarkable     MDM  Hemoglobin 5.4        Transfuse 2 units packed cells.     Admit to internal medicine teaching program        Donnetta Hutching, MD 10/31/12 [phone]

## 2012-10-31 NOTE — ED Notes (Signed)
Pt presents with intermittent dizziness "for a while" that worsened this morning.  Pt begins to cry during assessment, reports shortness of breath with dizzy episodes.  Pt denies any chest discomfort, reports "I've had these dizzy spells for a while but I overlooked them".  Pt reports productive cough with clear phlegm, lungs are clear to posterior fields.   Pt instructed to slow breathing down to prevent hyperventilation.

## 2012-10-31 NOTE — Consult Note (Signed)
Reason for Consult: Microcytic anemia Referring Physician: Teaching Service  Stacy Anderson HPI: This is a 47 year old female who presents to the ER with complaints of weakness for the past month, however, her symptoms markedly worsened over the past week.  She did not feel right and presented to the ER where her HGB was noted to be at 5.4 g/dL and an MCV in the 16'X range.  She does reports having some DOE.  In the recent past she was in the ER for complaints of dysphagia and she was thought to have an esophagitis, but this was not diagnosed endoscopically.  The patient denies any history of hematochezia, melena, abdominal pain, nausea, vomiting, hemoptysis, hematuria, diarrhea, or constipation. There is no history of menometrorrhagia, in fact, her last period was 8 months ago.  She does not know why she stopped having periods.  The patient uses ibuprofen on a daily basis for the past year.  It appears she uses 800 mg BID.  Past Medical History  Diagnosis Date  . Hypertension   . Sensorineural hearing loss of both ears     likely secondary to the radiation therapy, chemotherapy and the vascular flow issues  . Left carotid artery stenosis   . 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 Herpes genitalis 12/30/2008    Past Surgical History  Procedure Laterality Date  . Tubal ligation Bilateral 04/03/2003    Dr. Sylvester Harder  . Exploratory tympanotomy Right 06/05/2007    Stacy Anderson, M.D.    History reviewed. No pertinent family history.  Social History:  reports that she has never smoked. She does not have any smokeless tobacco history on file. She reports that she does not drink alcohol. Her drug history is not on file.  Allergies: No Known Allergies  Medications: Scheduled: Continuous:  Results for orders placed during the hospital encounter of 10/31/12 (from the past 24 hour(s))  RETICULOCYTES     Status: Abnormal   Collection Time    10/31/12  8:10 AM      Result Value Range   Retic Ct Pct 1.3  0.4 - 3.1 %   RBC. 3.06 (*) 3.87 - 5.11 MIL/uL   Retic Count, Manual 39.8  19.0 - 186.0 K/uL  CBC     Status: Abnormal   Collection Time    10/31/12 11:13 AM      Result Value Range   WBC 14.2 (*) 4.0 - 10.5 K/uL   RBC 2.94 (*) 3.87 - 5.11 MIL/uL   Hemoglobin 5.4 (*) 12.0 - 15.0 g/dL   HCT 09.6 (*) 04.5 - 40.9 %   MCV 65.6 (*) 78.0 - 100.0 fL   MCH 18.4 (*) 26.0 - 34.0 pg   MCHC 28.0 (*) 30.0 - 36.0 g/dL   RDW 81.1 (*) 91.4 - 78.2 %   Platelets 562 (*) 150 - 400 K/uL  BASIC METABOLIC PANEL     Status: Abnormal   Collection Time    10/31/12 11:13 AM      Result Value Range   Sodium 130 (*) 135 - 145 mEq/L   Potassium 4.2  3.5 - 5.1 mEq/L   Chloride 101  96 - 112 mEq/L   CO2 17 (*) 19 - 32 mEq/L   Glucose, Bld 88  70 - 99 mg/dL   BUN 22  6 - 23 mg/dL   Creatinine, Ser 9.56 (*) 0.50 - 1.10 mg/dL   Calcium 9.7  8.4 -  10.5 mg/dL   GFR calc non Af Amer 39 (*) >90 mL/min   GFR calc Af Amer 45 (*) >90 mL/min  TYPE AND SCREEN     Status: None   Collection Time    10/31/12  1:35 PM      Result Value Range   ABO/RH(D) B POS     Antibody Screen NEG     Sample Expiration 11/03/2012     Unit Number [id number]     Blood Component Type RED CELLS,LR     Unit division 00     Status of Unit ALLOCATED     Transfusion Status OK TO TRANSFUSE     Crossmatch Result Compatible     Unit Number [id number]     Blood Component Type RED CELLS,LR     Unit division 00     Status of Unit ISSUED     Transfusion Status OK TO TRANSFUSE     Crossmatch Result Compatible    PREPARE RBC (CROSSMATCH)     Status: None   Collection Time    10/31/12  1:35 PM      Result Value Range   Order Confirmation ORDER PROCESSED BY BLOOD BANK    ABO/RH     Status: None   Collection Time    10/31/12  1:35 PM      Result Value Range   ABO/RH(D) B POS    TECHNOLOGIST SMEAR REVIEW     Status: None   Collection Time    10/31/12  2:00 PM       Result Value Range   Tech Review POLYCHROMASIA PRESENT    LACTATE DEHYDROGENASE     Status: None   Collection Time    10/31/12  2:00 PM      Result Value Range   LDH 230  94 - 250 U/L  OCCULT BLOOD, POC DEVICE     Status: None   Collection Time    10/31/12  3:29 PM      Result Value Range   Fecal Occult Bld NEGATIVE  NEGATIVE  HEPATIC FUNCTION PANEL     Status: Abnormal   Collection Time    10/31/12  3:30 PM      Result Value Range   Total Protein 7.5  6.0 - 8.3 g/dL   Albumin 3.9  3.5 - 5.2 g/dL   AST 19  0 - 37 U/L   ALT 9  0 - 35 U/L   Alkaline Phosphatase 71  39 - 117 U/L   Total Bilirubin 0.1 (*) 0.3 - 1.2 mg/dL   Bilirubin, Direct <4.1  0.0 - 0.3 mg/dL   Indirect Bilirubin NOT CALCULATED  0.3 - 0.9 mg/dL  DIFFERENTIAL     Status: Abnormal   Collection Time    10/31/12  3:30 PM      Result Value Range   Neutrophils Relative % 68  43 - 77 %   Neutro Abs 9.5 (*) 1.7 - 7.7 K/uL   Lymphocytes Relative 23  12 - 46 %   Lymphs Abs 3.2  0.7 - 4.0 K/uL   Monocytes Relative 5  3 - 12 %   Monocytes Absolute 0.7  0.1 - 1.0 K/uL   Eosinophils Relative 3  0 - 5 %   Eosinophils Absolute 0.5  0.0 - 0.7 K/uL   Basophils Relative 0  0 - 1 %   Basophils Absolute 0.1  0.0 - 0.1 K/uL     Ct Head Wo Contrast  10/31/2012   *  RADIOLOGY REPORT*  Clinical Data: Dizziness  CT HEAD WITHOUT CONTRAST  Technique:  Contiguous axial images were obtained from the base of the skull through the vertex without contrast.  Comparison: MRI 01/10/2007.  CT temporal bone 01/04/2007  Findings: Ventricle size is normal.  Negative for acute infarct, hemorrhage, or mass lesion.  No edema or midline shift.  Sclerotic changes are present in the skull base, similar to 2008. Question radiation change or fibrous dysplasia.   There is also chronic sinusitis with mucosal edema and bony thickening of the maxillary sinus bilaterally.  Mastoid sinus is clear bilaterally.  IMPRESSION: No acute intracranial abnormality.   Sclerotic changes in the skull base unchanged.  Question fibrous dysplasia or post radiation change.  The patient has a history of neoplasm of the nasal cavity.   Original Report Authenticated By: Stacy Anderson, M.D.    ROS:  As stated above in the HPI otherwise negative.  Blood pressure 115/66, pulse 90, temperature 98.5 F (36.9 C), temperature source Oral, resp. rate 22, height 5\' 8"  (1.727 m), weight 170 lb (77.111 kg), last menstrual period 05/17/2012, SpO2 97.00%.    PE: Gen: NAD, Alert and Oriented HEENT:  [place]/AT, EOMI Neck: Supple, no LAD Lungs: CTA Bilaterally CV: RRR without M/G/R ABM: Soft, NTND, +BS Ext: No C/C/E  Assessment/Plan: 1) Microcytic anemia.   She apparently has a baseline HGB in the 8-9 range, but these values were in 2012.  No interval HGBs to track the acuity of her anemia.  She has a long history of NSAID use.    Plan: 1) EGD/Colonoscopy tomorrow.  Stacy Anderson D 10/31/2012, 6:26 PM

## 2012-10-31 NOTE — ED Notes (Signed)
Admission MD at bedside for consult.

## 2012-11-01 ENCOUNTER — Encounter (HOSPITAL_COMMUNITY)
Admission: EM | Disposition: A | Discharge status: Home / Self Care | Payer: Self-pay | Source: Home / Self Care | Attending: Internal Medicine

## 2012-11-01 ENCOUNTER — Encounter (HOSPITAL_COMMUNITY): Payer: Self-pay | Admitting: *Deleted

## 2012-11-01 DIAGNOSIS — D62 Acute posthemorrhagic anemia: Secondary | ICD-10-CM

## 2012-11-01 DIAGNOSIS — K2980 Duodenitis without bleeding: Secondary | ICD-10-CM | POA: Diagnosis present

## 2012-11-01 HISTORY — PX: ESOPHAGOGASTRODUODENOSCOPY: SHX5428

## 2012-11-01 LAB — CBC
HCT: 24.5 % — ABNORMAL LOW (ref 36.0–46.0)
MCH: 21.3 pg — ABNORMAL LOW (ref 26.0–34.0)
MCH: 20.9 pg — ABNORMAL LOW (ref 26.0–34.0)
MCHC: 30.8 g/dL (ref 30.0–36.0)
MCHC: 30.1 g/dL (ref 30.0–36.0)
MCV: 69.5 fL — ABNORMAL LOW (ref 78.0–100.0)
Platelets: 506 10*3/uL — ABNORMAL HIGH (ref 150–400)
Platelets: 473 10*3/uL — ABNORMAL HIGH (ref 150–400)
Platelets: 498 10*3/uL — ABNORMAL HIGH (ref 150–400)
RBC: 3.67 MIL/uL — ABNORMAL LOW (ref 3.87–5.11)
RBC: 3.56 MIL/uL — ABNORMAL LOW (ref 3.87–5.11)
RDW: 20 % — ABNORMAL HIGH (ref 11.5–15.5)
RDW: 20.3 % — ABNORMAL HIGH (ref 11.5–15.5)
WBC: 11.2 10*3/uL — ABNORMAL HIGH (ref 4.0–10.5)

## 2012-11-01 LAB — COMPREHENSIVE METABOLIC PANEL
ALT: 9 U/L (ref 0–35)
AST: 16 U/L (ref 0–37)
Albumin: 3.4 g/dL — ABNORMAL LOW (ref 3.5–5.2)
Alkaline Phosphatase: 65 U/L (ref 39–117)
Chloride: 108 mEq/L (ref 96–112)
Potassium: 4.1 mEq/L (ref 3.5–5.1)
Total Bilirubin: 0.8 mg/dL (ref 0.3–1.2)

## 2012-11-01 SURGERY — EGD (ESOPHAGOGASTRODUODENOSCOPY)
Anesthesia: Moderate Sedation

## 2012-11-01 SURGERY — COLONOSCOPY
Anesthesia: Moderate Sedation

## 2012-11-01 MED ORDER — MIDAZOLAM HCL 5 MG/ML IJ SOLN
INTRAMUSCULAR | Status: AC
  Filled 2012-11-01: qty 2

## 2012-11-01 MED ORDER — FENTANYL CITRATE 0.05 MG/ML IJ SOLN
INTRAMUSCULAR | Status: AC
  Filled 2012-11-01: qty 2

## 2012-11-01 MED ORDER — DIPHENHYDRAMINE HCL 50 MG/ML IJ SOLN
INTRAMUSCULAR | Status: AC
  Filled 2012-11-01: qty 1

## 2012-11-01 MED ORDER — ACETAMINOPHEN 325 MG PO TABS
650.0000 mg | ORAL_TABLET | Freq: Four times a day (QID) | ORAL | Status: DC | PRN
  Administered 2012-11-01: 650 mg via ORAL
  Filled 2012-11-01: qty 2

## 2012-11-01 MED ORDER — PANTOPRAZOLE SODIUM 40 MG PO TBEC
40.0000 mg | DELAYED_RELEASE_TABLET | Freq: Every day | ORAL | Status: DC
  Administered 2012-11-01 – 2012-11-04 (×4): 40 mg via ORAL
  Filled 2012-11-01 (×4): qty 1

## 2012-11-01 MED ORDER — FERROUS SULFATE 325 (65 FE) MG PO TABS
325.0000 mg | ORAL_TABLET | Freq: Every day | ORAL | Status: DC
  Administered 2012-11-01 – 2012-11-03 (×3): 325 mg via ORAL
  Filled 2012-11-01 (×4): qty 1

## 2012-11-01 MED ORDER — FENTANYL CITRATE 0.05 MG/ML IJ SOLN
INTRAMUSCULAR | Status: DC | PRN
  Administered 2012-11-01 (×3): 25 ug via INTRAVENOUS

## 2012-11-01 MED ORDER — MIDAZOLAM HCL 10 MG/2ML IJ SOLN
INTRAMUSCULAR | Status: DC | PRN
  Administered 2012-11-01: 2 mg via INTRAVENOUS
  Administered 2012-11-01: 1 mg via INTRAVENOUS
  Administered 2012-11-01 (×2): 2 mg via INTRAVENOUS

## 2012-11-01 NOTE — Progress Notes (Signed)
Resident Co-sign Daily Note: I have seen the patient and reviewed the daily progress note by [facility] and discussed the care of the patient with them.  See below for documentation of my findings, assessment, and plans.  Subjective: Seen in endo recovery room. No complaints Objective: Vital signs in last 24 hours: Filed Vitals:   11/01/12 0930 11/01/12 0935 11/01/12 0940 11/01/12 0945  BP: 89/57 84/55 92/75  103/88  Pulse:      Temp:      TempSrc:      Resp: 18 20 21 17   Height:      Weight:      SpO2: 98% 98% 99% 99%   Physical Exam: Vitals reviewed. General: resting in bed, NAD HEENT: sclera nonicteric Cardiac: RRR, no rubs, murmurs or gallops Pulm: clear to auscultation bilaterally, no wheezes, rales, or rhonchi Abd: soft, nontender, nondistended, BS present Ext: warm and well perfused, no pedal edema  Lab Results: Reviewed and documented in Electronic Record Micro Results: Reviewed and documented in Electronic Record Studies/Results: Reviewed and documented in Electronic Record Medications: I have reviewed the patient's current medications. Scheduled Meds: . ferrous sulfate  325 mg Oral Q breakfast  . pantoprazole  40 mg Oral Daily  . sodium chloride  3 mL Intravenous Q12H   Continuous Infusions: . sodium chloride 150 mL/hr at 10/31/12 2108   PRN Meds:. Assessment/Plan: Microcytic Anemia  2/2 iron deficiency and erosive gastritis/duodenitis (see below).Pt declined colonoscopy  - Fe supplementation 325 mg daily.  - CBC every 12 hours.   Erosive Gastritis and Duodenitis  EGD this am, erosive gastritis and duodenitis, no active bleeding. GI follwing. Appreciate recs.  -Avoid NSAIDs.  -PO protonix 40 mg daily.   AKI  Likely 2/2 dehydration.  - NS @ 75 cc/hr, regular diet.   -CMP in AM.   HTN  Last BP 103/88 -Holding home meds given concern for GI bleed.    LOS: 1 day   Bronson Curb 11/01/2012, 10:45 AM

## 2012-11-01 NOTE — Op Note (Signed)
Eligha Bridegroom [facility] [address] [place], [address]   OPERATIVE PROCEDURE REPORT  PATIENT: Stacy Anderson, Stacy Anderson  MR#: [id number] BIRTHDATE: April 17, 1966  GENDER: Female ENDOSCOPIST: Jeani Hawking, MD ASSISTANT:   Beryle Beams, technician and Ennis Forts, RN PROCEDURE DATE: 11/01/2012 PROCEDURE:   EGD w/ biopsy ASA CLASS:   Class II INDICATIONS:Anemia. MEDICATIONS: Versed 7 mg IV and Fentanyl 75 mcg IV TOPICAL ANESTHETIC:   none  DESCRIPTION OF PROCEDURE:   After the risks benefits and alternatives of the procedure were thoroughly explained, informed consent was obtained.  The Pentax Gastroscope [id number]  endoscope was introduced through the mouth  and advanced to the second portion of the duodenum Without limitations.      The instrument was slowly withdrawn as the mucosa was fully examined.      FINDINGS: A moderate erosive gastritis was identified.  Multiple erosions were confluent.  There was no evidence of any active or recent bleeding.  In the duodenal bulb there was evidence of a duodenitis.  Cold biopsies of the gastric lumen were obtained.  No other abnormalities identified.   Retroflexed views revealed no abnormalities.     The scope was then withdrawn from the patient and the procedure terminated.  COMPLICATIONS: There were no complications.  IMPRESSION: 1) Erosive gastritis. 2) Duodenitis.  RECOMMENDATIONS: 1) Avoid NSAIDs. 2) PPI QD. 3) The source of the bleeding appears to be from the upper GI tract, however, if the anemia does not resolve a colonoscopy will be required.  She was supposed to have a colonoscopy with the EGD, but she did not prep as she refused the procedure.   _______________________________ eSigned:  Jeani Hawking, MD 11/01/2012 9:09 AM    PATIENT NAME:  Andretta, Ergle MR#: [id number]

## 2012-11-01 NOTE — Progress Notes (Signed)
Patient has refused to finish drinking her golytely prep in preparation for colonoscopy tomorrow.  She is alert and oriented.  Patient had four cup full and despite encouragement and education on the purpose of the prep, patient will not drink anymore.  States that she is tired of it.  MD on-call notified.  will continue to encourage her to drink.

## 2012-11-01 NOTE — H&P (View-Only) (Signed)
Reason for Consult: Microcytic anemia Referring Physician: Teaching Service  Stacy Anderson HPI: This is a 46 year old female who presents to the ER with complaints of weakness for the past month, however, her symptoms markedly worsened over the past week.  She did not feel right and presented to the ER where her HGB was noted to be at 5.4 g/dL and an MCV in the 60's range.  She does reports having some DOE.  In the recent past she was in the ER for complaints of dysphagia and she was thought to have an esophagitis, but this was not diagnosed endoscopically.  The patient denies any history of hematochezia, melena, abdominal pain, nausea, vomiting, hemoptysis, hematuria, diarrhea, or constipation. There is no history of menometrorrhagia, in fact, her last period was 8 months ago.  She does not know why she stopped having periods.  The patient uses ibuprofen on a daily basis for the past year.  It appears she uses 800 mg BID.  Past Medical History  Diagnosis Date  . Hypertension   . Sensorineural hearing loss of both ears     likely secondary to the radiation therapy, chemotherapy and the vascular flow issues  . Left carotid artery stenosis   . 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 Herpes genitalis 12/30/2008    Past Surgical History  Procedure Laterality Date  . Tubal ligation Bilateral 04/03/2003    Dr. James Matthews  . Exploratory tympanotomy Right 06/05/2007    James Crossley, M.D.    History reviewed. No pertinent family history.  Social History:  reports that she has never smoked. She does not have any smokeless tobacco history on file. She reports that she does not drink alcohol. Her drug history is not on file.  Allergies: No Known Allergies  Medications: Scheduled: Continuous:  Results for orders placed during the hospital encounter of 10/31/12 (from the past 24 hour(s))  RETICULOCYTES     Status: Abnormal   Collection Time    10/31/12  8:10 AM      Result Value Range   Retic Ct Pct 1.3  0.4 - 3.1 %   RBC. 3.06 (*) 3.87 - 5.11 MIL/uL   Retic Count, Manual 39.8  19.0 - 186.0 K/uL  CBC     Status: Abnormal   Collection Time    10/31/12 11:13 AM      Result Value Range   WBC 14.2 (*) 4.0 - 10.5 K/uL   RBC 2.94 (*) 3.87 - 5.11 MIL/uL   Hemoglobin 5.4 (*) 12.0 - 15.0 g/dL   HCT 19.3 (*) 36.0 - 46.0 %   MCV 65.6 (*) 78.0 - 100.0 fL   MCH 18.4 (*) 26.0 - 34.0 pg   MCHC 28.0 (*) 30.0 - 36.0 g/dL   RDW 19.7 (*) 11.5 - 15.5 %   Platelets 562 (*) 150 - 400 K/uL  BASIC METABOLIC PANEL     Status: Abnormal   Collection Time    10/31/12 11:13 AM      Result Value Range   Sodium 130 (*) 135 - 145 mEq/L   Potassium 4.2  3.5 - 5.1 mEq/L   Chloride 101  96 - 112 mEq/L   CO2 17 (*) 19 - 32 mEq/L   Glucose, Bld 88  70 - 99 mg/dL   BUN 22  6 - 23 mg/dL   Creatinine, Ser 1.57 (*) 0.50 - 1.10 mg/dL   Calcium 9.7  8.4 -   10.5 mg/dL   GFR calc non Af Amer 39 (*) >90 mL/min   GFR calc Af Amer 45 (*) >90 mL/min  TYPE AND SCREEN     Status: None   Collection Time    10/31/12  1:35 PM      Result Value Range   ABO/RH(D) B POS     Antibody Screen NEG     Sample Expiration 11/03/2012     Unit Number [id number]     Blood Component Type RED CELLS,LR     Unit division 00     Status of Unit ALLOCATED     Transfusion Status OK TO TRANSFUSE     Crossmatch Result Compatible     Unit Number [id number]     Blood Component Type RED CELLS,LR     Unit division 00     Status of Unit ISSUED     Transfusion Status OK TO TRANSFUSE     Crossmatch Result Compatible    PREPARE RBC (CROSSMATCH)     Status: None   Collection Time    10/31/12  1:35 PM      Result Value Range   Order Confirmation ORDER PROCESSED BY BLOOD BANK    ABO/RH     Status: None   Collection Time    10/31/12  1:35 PM      Result Value Range   ABO/RH(D) B POS    TECHNOLOGIST SMEAR REVIEW     Status: None   Collection Time    10/31/12  2:00 PM       Result Value Range   Tech Review POLYCHROMASIA PRESENT    LACTATE DEHYDROGENASE     Status: None   Collection Time    10/31/12  2:00 PM      Result Value Range   LDH 230  94 - 250 U/L  OCCULT BLOOD, POC DEVICE     Status: None   Collection Time    10/31/12  3:29 PM      Result Value Range   Fecal Occult Bld NEGATIVE  NEGATIVE  HEPATIC FUNCTION PANEL     Status: Abnormal   Collection Time    10/31/12  3:30 PM      Result Value Range   Total Protein 7.5  6.0 - 8.3 g/dL   Albumin 3.9  3.5 - 5.2 g/dL   AST 19  0 - 37 U/L   ALT 9  0 - 35 U/L   Alkaline Phosphatase 71  39 - 117 U/L   Total Bilirubin 0.1 (*) 0.3 - 1.2 mg/dL   Bilirubin, Direct <0.1  0.0 - 0.3 mg/dL   Indirect Bilirubin NOT CALCULATED  0.3 - 0.9 mg/dL  DIFFERENTIAL     Status: Abnormal   Collection Time    10/31/12  3:30 PM      Result Value Range   Neutrophils Relative % 68  43 - 77 %   Neutro Abs 9.5 (*) 1.7 - 7.7 K/uL   Lymphocytes Relative 23  12 - 46 %   Lymphs Abs 3.2  0.7 - 4.0 K/uL   Monocytes Relative 5  3 - 12 %   Monocytes Absolute 0.7  0.1 - 1.0 K/uL   Eosinophils Relative 3  0 - 5 %   Eosinophils Absolute 0.5  0.0 - 0.7 K/uL   Basophils Relative 0  0 - 1 %   Basophils Absolute 0.1  0.0 - 0.1 K/uL     Ct Head Wo Contrast  10/31/2012   *  RADIOLOGY REPORT*  Clinical Data: Dizziness  CT HEAD WITHOUT CONTRAST  Technique:  Contiguous axial images were obtained from the base of the skull through the vertex without contrast.  Comparison: MRI 01/10/2007.  CT temporal bone 01/04/2007  Findings: Ventricle size is normal.  Negative for acute infarct, hemorrhage, or mass lesion.  No edema or midline shift.  Sclerotic changes are present in the skull base, similar to 2008. Question radiation change or fibrous dysplasia.   There is also chronic sinusitis with mucosal edema and bony thickening of the maxillary sinus bilaterally.  Mastoid sinus is clear bilaterally.  IMPRESSION: No acute intracranial abnormality.   Sclerotic changes in the skull base unchanged.  Question fibrous dysplasia or post radiation change.  The patient has a history of neoplasm of the nasal cavity.   Original Report Authenticated By: David Clark, M.D.    ROS:  As stated above in the HPI otherwise negative.  Blood pressure 115/66, pulse 90, temperature 98.5 F (36.9 C), temperature source Oral, resp. rate 22, height 5' 8" (1.727 m), weight 170 lb (77.111 kg), last menstrual period 05/17/2012, SpO2 97.00%.    PE: Gen: NAD, Alert and Oriented HEENT:  [place]/AT, EOMI Neck: Supple, no LAD Lungs: CTA Bilaterally CV: RRR without M/G/R ABM: Soft, NTND, +BS Ext: No C/C/E  Assessment/Plan: 1) Microcytic anemia.   She apparently has a baseline HGB in the 8-9 range, but these values were in 2012.  No interval HGBs to track the acuity of her anemia.  She has a long history of NSAID use.    Plan: 1) EGD/Colonoscopy tomorrow.  Jeriah Corkum D 10/31/2012, 6:26 PM      

## 2012-11-01 NOTE — Interval H&P Note (Signed)
History and Physical Interval Note:  11/01/2012 8:51 AM  Stacy Anderson  has presented today for surgery, with the diagnosis of anemia  The various methods of treatment have been discussed with the patient and family. After consideration of risks, benefits and other options for treatment, the patient has consented to  Procedure(s): ESOPHAGOGASTRODUODENOSCOPY (EGD) (N/A) as a surgical intervention .  The patient's history has been reviewed, patient examined, no change in status, stable for surgery.  I have reviewed the patient's chart and labs.  Questions were answered to the patient's satisfaction.     Stacy Anderson D

## 2012-11-01 NOTE — Progress Notes (Signed)
Medical Student Daily Progress Note  Subjective: NAE overnight. EGD this AM; pt declined colonoscopy.  This AM, she endorses dizziness but denies chest pain.  She also reports that she is hungry.   Objective: Vital signs in last 24 hours: Filed Vitals:   11/01/12 0930 11/01/12 0935 11/01/12 0940 11/01/12 0945  BP: 89/57 84/55 92/75  103/88  Pulse:      Temp:      TempSrc:      Resp: 18 20 21 17   Height:      Weight:      SpO2: 98% 98% 99% 99%   Weight change:   Intake/Output Summary (Last 24 hours) at 11/01/12 0959 Last data filed at 11/01/12 0112  Gross per 24 hour  Intake  762.9 ml  Output      0 ml  Net  762.9 ml    Physical Exam: BP 103/88  Pulse 85  Temp(Src) 98.7 F (37.1 C) (Oral)  Resp 17  Ht 5\' 8"  (1.727 m)  Wt 73.3 kg (161 lb 9.6 oz)  BMI 24.58 kg/m2  SpO2 99%  LMP 05/17/2012 General appearance: alert, cooperative, appears older than stated age and no distress  Head: Normocephalic, without obvious abnormality, atraumatic, sinuses nontender to percussion  Eyes: EOMI. Sclera midly icteric.  Ears: R hearing aid in place. Nose: deviated septum  Neck: no adenopathy, no JVD, supple, symmetrical, trachea midline and thyroid not enlarged, symmetric, no tenderness/mass/nodules. No carotid bruit. Lungs: clear to auscultation bilaterally  Heart: regular rate and rhythm, S1, S2 normal, no murmur, click, rub or gallop  Abdomen: soft, non-tender; bowel sounds normal; no masses, no organomegaly  Extremities: extremities normal, atraumatic, no cyanosis or edema  Pulses: 2+ and symmetric  Skin: Skin color, texture, turgor normal. No rashes or lesions  Neurologic: Grossly normal  Lab Results: Basic Metabolic Panel:  Recent Labs  30/86/57 1113  NA 130*  K 4.2  CL 101  CO2 17*  GLUCOSE 88  BUN 22  CREATININE 1.57*  CALCIUM 9.7   Liver Function Tests:  Recent Labs  10/31/12 1530  AST 19  ALT 9  ALKPHOS 71  BILITOT 0.1*  PROT 7.5  ALBUMIN 3.9   No  results found for this basename: LIPASE, AMYLASE,  in the last 72 hours No results found for this basename: AMMONIA,  in the last 72 hours CBC:  Recent Labs  10/31/12 1113 10/31/12 1530 11/01/12 0535  WBC 14.2*  --  12.3*  NEUTROABS  --  9.5*  --   HGB 5.4*  --  7.8*  HCT 19.3*  --  25.3*  MCV 65.6*  --  68.9*  PLT 562*  --  506*   Cardiac Enzymes: No results found for this basename: CKTOTAL, CKMB, CKMBINDEX, TROPONINI,  in the last 72 hours BNP: No results found for this basename: PROBNP,  in the last 72 hours D-Dimer: No results found for this basename: DDIMER,  in the last 72 hours CBG: No results found for this basename: GLUCAP,  in the last 72 hours Hemoglobin A1C: No results found for this basename: HGBA1C,  in the last 72 hours Fasting Lipid Panel: No results found for this basename: CHOL, HDL, LDLCALC, TRIG, CHOLHDL, LDLDIRECT,  in the last 72 hours Thyroid Function Tests: No results found for this basename: TSH, T4TOTAL, FREET4, T3FREE, THYROIDAB,  in the last 72 hours Anemia Panel:  Recent Labs  10/31/12 0810  VITAMINB12 338  FOLATE 19.2  FERRITIN 2*  TIBC 449  IRON 10*  RETICCTPCT 1.3   Coagulation: No results found for this basename: LABPROT, INR,  in the last 72 hours Urine Drug Screen: Drugs of Abuse  No results found for this basename: labopia, cocainscrnur, labbenz, amphetmu, thcu, labbarb    Alcohol Level: No results found for this basename: ETH,  in the last 72 hours Urinalysis: No results found for this basename: COLORURINE, APPERANCEUR, LABSPEC, PHURINE, GLUCOSEU, HGBUR, BILIRUBINUR, KETONESUR, PROTEINUR, UROBILINOGEN, NITRITE, LEUKOCYTESUR,  in the last 72 hours   Micro Results: No results found for this or any previous visit (from the past 240 hour(s)).  Studies/Results: Ct Head Wo Contrast  10/31/2012   *RADIOLOGY REPORT*  Clinical Data: Dizziness  CT HEAD WITHOUT CONTRAST  Technique:  Contiguous axial images were obtained from the  base of the skull through the vertex without contrast.  Comparison: MRI 01/10/2007.  CT temporal bone 01/04/2007  Findings: Ventricle size is normal.  Negative for acute infarct, hemorrhage, or mass lesion.  No edema or midline shift.  Sclerotic changes are present in the skull base, similar to 2008. Question radiation change or fibrous dysplasia.   There is also chronic sinusitis with mucosal edema and bony thickening of the maxillary sinus bilaterally.  Mastoid sinus is clear bilaterally.  IMPRESSION: No acute intracranial abnormality.  Sclerotic changes in the skull base unchanged.  Question fibrous dysplasia or post radiation change.  The patient has a history of neoplasm of the nasal cavity.   Original Report Authenticated By: Janeece Riggers, M.D.    Medications: I have reviewed the patient's current medications. Scheduled Meds: . pantoprazole (PROTONIX) IV  40 mg Intravenous Q12H  . polyethylene glycol-electrolytes  4,000 mL Oral Once  . sodium chloride  3 mL Intravenous Q12H   Continuous Infusions: . sodium chloride 150 mL/hr at 10/31/12 2108  . sodium chloride     PRN Meds:.   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   Principal Problem:   Acute blood loss anemia Active Problems:   HEARING LOSS   HYPERTENSION, BENIGN ESSENTIAL   LOS: 1 day   Microcytic Anemia  -Etiology likely related to Fe deficiency related to poor PO intake and erosive gastritis and duodenitis identified on EGD, though there was no evidence of active or recent bleeding. Pt declined colonoscopy but will revisit if H/H drops.  -Fe low at 10, Ferritin low at 2, TIBC wnl at 449, UIBC high at 439, suggesting Fe deficiency.  Initiate Fe supplementation 325 mg daily. -Hepatoglobin of 192 does not support hemolytic picture. -Vit B12 wnl and folate wnl. -Post  transfusion Hgb 7.8 (s/p 2 U RBC 5/15). CBC q6h.  -Maintain active T&S.  -Check TSH to r/o HPA axis damage from previous radiation.   Erosive Gastritis and Duodenitis -GI follwing. Appreciate recs.  -Avoid NSAIDs. -PO protonix 40 mg daily.   AKI -Likely 2/2 dehydration. -NS @ 75 cc/hr.  -Regular diet. Encourage PO intake.  -CMP in AM.   HTN  -HDS. Cardiac monitoring.  -Holding home meds given concern for GI bleed.  -Repeat EKG this AM.   Access: R AC PIV (NS 10/31/12)   Code: full   Prophylaxis: SCDs   Dispo: floor status  -Anticipated date of discharge: 1-3 days  This is a Psychologist, occupational Note.  The care of the patient was discussed with Drs. Ziemer/Mullen and the assessment and plan formulated with their  assistance.  Please see their attached note for official documentation of the daily encounter.  Harland Dingwall Barstowlow 11/01/2012, 9:59 AM

## 2012-11-01 NOTE — H&P (Signed)
Internal Medicine Teaching Service Attending Note Date: 11/01/2012  Patient name: Stacy Anderson  Medical record number: [id number]  Date of birth: 1966-03-10   CC: Dizziness and SOB  I have seen and evaluated Stacy Anderson and discussed their care with the Residency Team.    Stacy Anderson is a 47yo woman with PMH of nasal tumor s/p chemo/rad at 47 yo, HTN, GERD and depression.  She has hearing loss due to her cancer therapy.  Stacy Anderson presented to the ED for 1-2 weeks of worsening dizziness, SOB with exertion which worsened over the past 24 hours.  Stacy Anderson was recently seen in the ED on 10/23/12 for chest pain and dysphagia and was prescribed prilosec.  She also has a history of low blood counts.  On admission, her Hgb was found to be < 6.  She denied any blood in her stool, melena, hemetemesis, coffee ground emesis.  She has no family with a h/o colon cancer.  She has not had a menstrual period in about 8 months.  She does have heartburn and nausea, which is improved with prilosec, but not resolved.  She further has dysphagia.  She takes ibuprofen 800mg  BID for chronic pain for the past 6-7 months.  She has smoked 1 cigar/week for about 1-2 years.  Denies ETOH use.   For further medical history, meds, allergies and ROS, please see resident note.   Physical Exam: Blood pressure 103/88, pulse 85, temperature 98.7 F (37.1 C), temperature source Oral, resp. rate 17, height 5\' 8"  (1.727 m), weight 161 lb 9.6 oz (73.3 kg), last menstrual period 05/17/2012, SpO2 99.00%. General appearance: alert, cooperative and appears stated age Head: Normocephalic, without obvious abnormality, atraumatic Eyes: pale conjuntiva, anicteric sclerae Ears: Bilateral hearing aids.  Lungs: clear to auscultation bilaterally and no wheezing Heart: RR, NR, no murmur Abdomen: soft, NT, +BS Extremities: no edmea Pulses: 2+ and symmetric  Lab results: Results for orders placed during the hospital encounter of 10/31/12  (from the past 24 hour(s))  TYPE AND SCREEN     Status: None   Collection Time    10/31/12  1:35 PM      Result Value Range   ABO/RH(D) B POS     Antibody Screen NEG     Sample Expiration 11/03/2012     Unit Number [id number]     Blood Component Type RED CELLS,LR     Unit division 00     Status of Unit ISSUED,FINAL     Transfusion Status OK TO TRANSFUSE     Crossmatch Result Compatible     Unit Number [id number]     Blood Component Type RED CELLS,LR     Unit division 00     Status of Unit ISSUED,FINAL     Transfusion Status OK TO TRANSFUSE     Crossmatch Result Compatible    PREPARE RBC (CROSSMATCH)     Status: None   Collection Time    10/31/12  1:35 PM      Result Value Range   Order Confirmation ORDER PROCESSED BY BLOOD BANK    ABO/RH     Status: None   Collection Time    10/31/12  1:35 PM      Result Value Range   ABO/RH(D) B POS    TECHNOLOGIST SMEAR REVIEW     Status: None   Collection Time    10/31/12  2:00 PM      Result Value Range   Tech Review POLYCHROMASIA PRESENT  LACTATE DEHYDROGENASE     Status: None   Collection Time    10/31/12  2:00 PM      Result Value Range   LDH 230  94 - 250 U/L  HAPTOGLOBIN     Status: None   Collection Time    10/31/12  2:00 PM      Result Value Range   Haptoglobin 192  45 - 215 mg/dL  OCCULT BLOOD, POC DEVICE     Status: None   Collection Time    10/31/12  3:29 PM      Result Value Range   Fecal Occult Bld NEGATIVE  NEGATIVE  HEPATIC FUNCTION PANEL     Status: Abnormal   Collection Time    10/31/12  3:30 PM      Result Value Range   Total Protein 7.5  6.0 - 8.3 g/dL   Albumin 3.9  3.5 - 5.2 g/dL   AST 19  0 - 37 U/L   ALT 9  0 - 35 U/L   Alkaline Phosphatase 71  39 - 117 U/L   Total Bilirubin 0.1 (*) 0.3 - 1.2 mg/dL   Bilirubin, Direct <1.6  0.0 - 0.3 mg/dL   Indirect Bilirubin NOT CALCULATED  0.3 - 0.9 mg/dL  DIFFERENTIAL     Status: Abnormal   Collection Time    10/31/12  3:30 PM      Result Value  Range   Neutrophils Relative % 68  43 - 77 %   Neutro Abs 9.5 (*) 1.7 - 7.7 K/uL   Lymphocytes Relative 23  12 - 46 %   Lymphs Abs 3.2  0.7 - 4.0 K/uL   Monocytes Relative 5  3 - 12 %   Monocytes Absolute 0.7  0.1 - 1.0 K/uL   Eosinophils Relative 3  0 - 5 %   Eosinophils Absolute 0.5  0.0 - 0.7 K/uL   Basophils Relative 0  0 - 1 %   Basophils Absolute 0.1  0.0 - 0.1 K/uL  CBC     Status: Abnormal   Collection Time    11/01/12  5:35 AM      Result Value Range   WBC 12.3 (*) 4.0 - 10.5 K/uL   RBC 3.67 (*) 3.87 - 5.11 MIL/uL   Hemoglobin 7.8 (*) 12.0 - 15.0 g/dL   HCT 10.9 (*) 60.4 - 54.0 %   MCV 68.9 (*) 78.0 - 100.0 fL   MCH 21.3 (*) 26.0 - 34.0 pg   MCHC 30.8  30.0 - 36.0 g/dL   RDW 98.1 (*) 19.1 - 47.8 %   Platelets 506 (*) 150 - 400 K/uL  CBC     Status: Abnormal   Collection Time    11/01/12 10:13 AM      Result Value Range   WBC 11.2 (*) 4.0 - 10.5 K/uL   RBC 3.56 (*) 3.87 - 5.11 MIL/uL   Hemoglobin 7.5 (*) 12.0 - 15.0 g/dL   HCT 29.5 (*) 62.1 - 30.8 %   MCV 68.8 (*) 78.0 - 100.0 fL   MCH 21.1 (*) 26.0 - 34.0 pg   MCHC 30.6  30.0 - 36.0 g/dL   RDW 65.7 (*) 84.6 - 96.2 %   Platelets 473 (*) 150 - 400 K/uL  COMPREHENSIVE METABOLIC PANEL     Status: Abnormal   Collection Time    11/01/12 10:13 AM      Result Value Range   Sodium 137  135 - 145 mEq/L  Potassium 4.1  3.5 - 5.1 mEq/L   Chloride 108  96 - 112 mEq/L   CO2 19  19 - 32 mEq/L   Glucose, Bld 76  70 - 99 mg/dL   BUN 16  6 - 23 mg/dL   Creatinine, Ser 2.13  0.50 - 1.10 mg/dL   Calcium 9.2  8.4 - 08.6 mg/dL   Total Protein 6.7  6.0 - 8.3 g/dL   Albumin 3.4 (*) 3.5 - 5.2 g/dL   AST 16  0 - 37 U/L   ALT 9  0 - 35 U/L   Alkaline Phosphatase 65  39 - 117 U/L   Total Bilirubin 0.8  0.3 - 1.2 mg/dL   GFR calc non Af Amer 78 (*) >90 mL/min   GFR calc Af Amer 90 (*) >90 mL/min    Imaging results:  Ct Head Wo Contrast  10/31/2012   *RADIOLOGY REPORT*  Clinical Data: Dizziness  CT HEAD WITHOUT CONTRAST   Technique:  Contiguous axial images were obtained from the base of the skull through the vertex without contrast.  Comparison: MRI 01/10/2007.  CT temporal bone 01/04/2007  Findings: Ventricle size is normal.  Negative for acute infarct, hemorrhage, or mass lesion.  No edema or midline shift.  Sclerotic changes are present in the skull base, similar to 2008. Question radiation change or fibrous dysplasia.   There is also chronic sinusitis with mucosal edema and bony thickening of the maxillary sinus bilaterally.  Mastoid sinus is clear bilaterally.  IMPRESSION: No acute intracranial abnormality.  Sclerotic changes in the skull base unchanged.  Question fibrous dysplasia or post radiation change.  The patient has a history of neoplasm of the nasal cavity.   Original Report Authenticated By: Janeece Riggers, M.D.    Assessment and Plan: I agree with the formulated Assessment and Plan with the following changes:   1. Acute on chronic microcytic anemia - Most likely due to loss from a GI source; She is no longer having periods (for last 8 months) and there is no gross source of blood loss.  She could also have BM deficiency as she had head/neck radiation.   - Blood transfusion, check hemolysis labs, check Iron panel - Q6 hour CBC - GI consult - IV protonix, NPO - Hemocult  2. Dysphagia - GI consult as above - Consider SLP consult  Other issues per resident note.   Inez Catalina, MD 5/16/201412:07 PM

## 2012-11-01 NOTE — Progress Notes (Signed)
Utilization review completed.  

## 2012-11-02 LAB — CBC
HCT: 26.2 % — ABNORMAL LOW (ref 36.0–46.0)
HCT: 28.5 % — ABNORMAL LOW (ref 36.0–46.0)
Hemoglobin: 7.9 g/dL — ABNORMAL LOW (ref 12.0–15.0)
Hemoglobin: 8.7 g/dL — ABNORMAL LOW (ref 12.0–15.0)
MCH: 20.9 pg — ABNORMAL LOW (ref 26.0–34.0)
MCH: 21.4 pg — ABNORMAL LOW (ref 26.0–34.0)
MCHC: 30.2 g/dL (ref 30.0–36.0)
MCHC: 30.5 g/dL (ref 30.0–36.0)
MCV: 69.3 fL — ABNORMAL LOW (ref 78.0–100.0)
MCV: 70.2 fL — ABNORMAL LOW (ref 78.0–100.0)
RBC: 3.78 MIL/uL — ABNORMAL LOW (ref 3.87–5.11)
RBC: 4.06 MIL/uL (ref 3.87–5.11)

## 2012-11-02 LAB — COMPREHENSIVE METABOLIC PANEL
ALT: 7 U/L (ref 0–35)
AST: 18 U/L (ref 0–37)
Albumin: 3.3 g/dL — ABNORMAL LOW (ref 3.5–5.2)
CO2: 18 mEq/L — ABNORMAL LOW (ref 19–32)
Calcium: 9.3 mg/dL (ref 8.4–10.5)
Creatinine, Ser: 0.81 mg/dL (ref 0.50–1.10)
GFR calc non Af Amer: 86 mL/min — ABNORMAL LOW (ref >90)
Sodium: 135 mEq/L (ref 135–145)
Total Protein: 7.1 g/dL (ref 6.0–8.3)

## 2012-11-02 MED ORDER — OXYCODONE-ACETAMINOPHEN 5-325 MG PO TABS
1.0000 | ORAL_TABLET | Freq: Three times a day (TID) | ORAL | Status: DC | PRN
  Administered 2012-11-02: 1 via ORAL
  Filled 2012-11-02: qty 1

## 2012-11-02 MED ORDER — LISINOPRIL 10 MG PO TABS
10.0000 mg | ORAL_TABLET | Freq: Every day | ORAL | Status: DC
  Administered 2012-11-02: 10 mg via ORAL
  Filled 2012-11-02: qty 1

## 2012-11-02 MED ORDER — SODIUM CHLORIDE 0.9 % IV SOLN
INTRAVENOUS | Status: AC
  Administered 2012-11-02: 18:00:00 via INTRAVENOUS

## 2012-11-02 NOTE — Progress Notes (Signed)
INTERNAL MEDICINE TEACHING SERVICE Attending Note  Date: 11/02/2012  Patient name: Stacy Anderson  Medical record number: [id number]  Date of birth: 08/16/1965    This patient has been seen and discussed with the house staff. Please see their note for complete details. I concur with their findings with the following additions/corrections:  This morning the patient was noted to be hypotensive with BP of 59/42 and HR 165 with dizziness upon standing. She reports no melena or hematochezia. She is s/p 2 units of PRBCs and her H/H has been stable since transfusion. I personally was made aware of this BP about 20 minutes ago and evaluated the patient. She is AAOx3. I personally checked orthostatic vital signs on her with results: Sitting: BP 135/94, HR 78 Standing: BP 138/90, HR 99.  She reported dizziness upon standing. Obtain stat H/H at this time. I will give her a 1 L bolus of NS at 200 cc/hr and recheck orthostatic VS after this. I do not think she is acutely bleeding. Reviewed EGD report. She will need a colonoscopy sometime, but I do not think this is needed emergently. Reviewed BUN values recently, none suspicious for UGI bleed.   The treatment plan was discussed in detail with the patient.  Alternatives to treatment, side effects, risks and benefits, and complications were discussed with the patient. Informed consent was obtained. The patient agrees to proceed with the current treatment plan.  Jonah Blue, DO  11/02/2012, 2:57 PM

## 2012-11-02 NOTE — Progress Notes (Signed)
Called MD with results of orthostatic blood pressures at 2012.  Patient did complain of slight dizziness when standing.  No complaints of headache.  Stressed to patient to call for assistance before going to the bathroom.  She agreed to bed alarm.  Yellow armband and red socks are on the patient.  Lyna Laningham RN, Justine Null

## 2012-11-02 NOTE — Progress Notes (Signed)
Subjective: Did well overnight.  Reports some dizziness, but only when she stands up.  Continues to have a cough but no shortness of breath or chest pain.  Tolerating her diet well.   Objective: Vital signs in last 24 hours: Filed Vitals:   11/01/12 0940 11/01/12 0945 11/01/12 2105 11/02/12 0523  BP: 92/75 103/88 149/93 150/89  Pulse:   92 83  Temp:   99.3 F (37.4 C) 98.7 F (37.1 C)  TempSrc:   Oral Oral  Resp: 21 17 18 18   Height:   5\' 8"  (1.727 m)   Weight:   163 lb 9.3 oz (74.2 kg)   SpO2: 99% 99% 100% 100%   Weight change: -6 lb 6.7 oz (-2.912 kg)  Intake/Output Summary (Last 24 hours) at 11/02/12 0852 Last data filed at 11/02/12 0700  Gross per 24 hour  Intake   4480 ml  Output      0 ml  Net   4480 ml    Physical Exam: BP 150/89  Pulse 83  Temp(Src) 98.7 F (37.1 C) (Oral)  Resp 18  Ht 5\' 8"  (1.727 m)  Wt 163 lb 9.3 oz (74.2 kg)  BMI 24.88 kg/m2  SpO2 100%  LMP 05/17/2012 General appearance: alert, cooperative, appears older than stated age and no distress  Ears: R hearing aid in place. Lungs: clear to auscultation bilaterally  Heart: regular rate and rhythm, S1, S2 normal, no murmur, click, rub or gallop  Extremities: extremities normal, atraumatic, no cyanosis or edema   Neurologic: no focal deficits  Lab Results: Basic Metabolic Panel:  Recent Labs  82/95/62 1013 11/02/12 0640  NA 137 135  K 4.1 4.1  CL 108 104  CO2 19 18*  GLUCOSE 76 77  BUN 16 11  CREATININE 0.88 0.81  CALCIUM 9.2 9.3   Liver Function Tests:  Recent Labs  11/01/12 1013 11/02/12 0640  AST 16 18  ALT 9 7  ALKPHOS 65 71  BILITOT 0.8 0.2*  PROT 6.7 7.1  ALBUMIN 3.4* 3.3*   No results found for this basename: LIPASE, AMYLASE,  in the last 72 hours No results found for this basename: AMMONIA,  in the last 72 hours CBC:  Recent Labs  10/31/12 1530  11/01/12 2109 11/02/12 0640  WBC  --   < > 12.4* 11.8*  NEUTROABS 9.5*  --   --   --   HGB  --   < > 8.0* 7.9*   HCT  --   < > 26.6* 26.2*  MCV  --   < > 69.5* 69.3*  PLT  --   < > 498* 479*  < > = values in this interval not displayed. Cardiac Enzymes: No results found for this basename: CKTOTAL, CKMB, CKMBINDEX, TROPONINI,  in the last 72 hours BNP: No results found for this basename: PROBNP,  in the last 72 hours D-Dimer: No results found for this basename: DDIMER,  in the last 72 hours CBG: No results found for this basename: GLUCAP,  in the last 72 hours Hemoglobin A1C: No results found for this basename: HGBA1C,  in the last 72 hours Fasting Lipid Panel: No results found for this basename: CHOL, HDL, LDLCALC, TRIG, CHOLHDL, LDLDIRECT,  in the last 72 hours Thyroid Function Tests: No results found for this basename: TSH, T4TOTAL, FREET4, T3FREE, THYROIDAB,  in the last 72 hours Anemia Panel:  Recent Labs  10/31/12 0810  VITAMINB12 338  FOLATE 19.2  FERRITIN 2*  TIBC 449  IRON  10*  RETICCTPCT 1.3   Coagulation: No results found for this basename: LABPROT, INR,  in the last 72 hours Urine Drug Screen: Drugs of Abuse  No results found for this basename: labopia,  cocainscrnur,  labbenz,  amphetmu,  thcu,  labbarb    Alcohol Level: No results found for this basename: ETH,  in the last 72 hours Urinalysis: No results found for this basename: COLORURINE, APPERANCEUR, LABSPEC, PHURINE, GLUCOSEU, HGBUR, BILIRUBINUR, KETONESUR, PROTEINUR, UROBILINOGEN, NITRITE, LEUKOCYTESUR,  in the last 72 hours   Micro Results: No results found for this or any previous visit (from the past 240 hour(s)).  Studies/Results: Ct Head Wo Contrast  10/31/2012   *RADIOLOGY REPORT*  Clinical Data: Dizziness  CT HEAD WITHOUT CONTRAST  Technique:  Contiguous axial images were obtained from the base of the skull through the vertex without contrast.  Comparison: MRI 01/10/2007.  CT temporal bone 01/04/2007  Findings: Ventricle size is normal.  Negative for acute infarct, hemorrhage, or mass lesion.  No edema  or midline shift.  Sclerotic changes are present in the skull base, similar to 2008. Question radiation change or fibrous dysplasia.   There is also chronic sinusitis with mucosal edema and bony thickening of the maxillary sinus bilaterally.  Mastoid sinus is clear bilaterally.  IMPRESSION: No acute intracranial abnormality.  Sclerotic changes in the skull base unchanged.  Question fibrous dysplasia or post radiation change.  The patient has a history of neoplasm of the nasal cavity.   Original Report Authenticated By: Janeece Riggers, M.D.    Medications: I have reviewed the patient's current medications. Scheduled Meds: . ferrous sulfate  325 mg Oral Q breakfast  . lisinopril  10 mg Oral Daily  . pantoprazole  40 mg Oral Daily  . sodium chloride  3 mL Intravenous Q12H   Continuous Infusions: . 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   Principal Problem:   Acute blood loss anemia Active Problems:   HEARING LOSS   HYPERTENSION, BENIGN ESSENTIAL   Acute esophagitis   Duodenitis   LOS: 2 days   Microcytic Anemia  -Etiology likely related to Fe deficiency related to poor PO intake and erosive gastritis and duodenitis identified on EGD, though there was no evidence of active or recent bleeding. Pt declined colonoscopy but will revisit if H/H drops. Provided education on why colonoscopy is indicated - with stable Hgb, there is no urgency to obtaining this, but recommend within the next year or sooner if hemoglobin drops again. - Patient reports history of very heavy periods with clots lasting 2 weeks; LMP 8 months ago -Fe low at 10, Ferritin low at 2, TIBC wnl at 449, UIBC high at 439, suggesting Fe deficiency.  Initiate Fe supplementation 325 mg  daily. -Hepatoglobin of 192 does not support hemolytic picture. -Vit B12 wnl and folate wnl. -Post transfusion Hgb 7.8 (s/p 2 U RBC 5/15). -Maintain active T&S.  -TSH pending - will space cbc to daily as Hgb stable at 8. -will check orthostatic vitals - may require additional unit of blood if orthostatic   Erosive Gastritis and Duodenitis -GI follwing. Appreciate recs.  -Avoid NSAIDs. -PO protonix 40 mg daily.   AKI - Resolved -Likely 2/2 dehydration. -NS @ 75 cc/hr.  -Regular diet. Encourage PO intake.  -recheck BMP  given low bicarb  HTN  -HDS. Cardiac monitoring.   -Repeat EKG showed nsr with right axis deviation, no ST elevation/depression -will restart lisinopril 10mg  today   Access: R AC PIV (NS 10/31/12)   Code: full   Prophylaxis: SCDs   Dispo: floor status  -Anticipated date of discharge: 1-3 days if hgb remains stable and dizziness improves  BOOTH, ERIN 11/02/2012, 8:52 AM

## 2012-11-02 NOTE — Progress Notes (Signed)
At approximately 2230, patient c/o throbbing headache behind her nose.  She states it reminds her of sinus headache and rates as a 7/10.  Called MD and received order for PO Tylenol which was given at 2305.  When checking on patient at [place], she was asleep.  Called back to patient's room at approximately 0340.  She is again complaining of headache with throbbing behind her nose.  She states she is not prone to sinus headaches.  No other symptoms reported by the patient.  MD called and received order for oxycodone which was given to patient at 0413.  Will continue to monitor patient.  Ellysia Char, Justine Null

## 2012-11-02 NOTE — Progress Notes (Signed)
Dr. Heloise Beecham notified of patient ortho BP.

## 2012-11-03 DIAGNOSIS — K2960 Other gastritis without bleeding: Secondary | ICD-10-CM | POA: Diagnosis present

## 2012-11-03 DIAGNOSIS — D7589 Other specified diseases of blood and blood-forming organs: Secondary | ICD-10-CM | POA: Diagnosis present

## 2012-11-03 DIAGNOSIS — I951 Orthostatic hypotension: Secondary | ICD-10-CM

## 2012-11-03 DIAGNOSIS — K209 Esophagitis, unspecified without bleeding: Secondary | ICD-10-CM

## 2012-11-03 DIAGNOSIS — D649 Anemia, unspecified: Secondary | ICD-10-CM

## 2012-11-03 DIAGNOSIS — I10 Essential (primary) hypertension: Secondary | ICD-10-CM

## 2012-11-03 DIAGNOSIS — K2980 Duodenitis without bleeding: Secondary | ICD-10-CM

## 2012-11-03 LAB — CBC
HCT: 26.7 % — ABNORMAL LOW (ref 36.0–46.0)
MCH: 21.1 pg — ABNORMAL LOW (ref 26.0–34.0)
MCV: 70.4 fL — ABNORMAL LOW (ref 78.0–100.0)
Platelets: 490 10*3/uL — ABNORMAL HIGH (ref 150–400)
RDW: 20.7 % — ABNORMAL HIGH (ref 11.5–15.5)
WBC: 13.3 10*3/uL — ABNORMAL HIGH (ref 4.0–10.5)

## 2012-11-03 LAB — BASIC METABOLIC PANEL
BUN: 13 mg/dL (ref 6–23)
Calcium: 9.5 mg/dL (ref 8.4–10.5)
Chloride: 105 mEq/L (ref 96–112)
Creatinine, Ser: 0.76 mg/dL (ref 0.50–1.10)
GFR calc Af Amer: >90 mL/min (ref >90)

## 2012-11-03 LAB — PREGNANCY, URINE: Preg Test, Ur: NEGATIVE

## 2012-11-03 MED ORDER — FERROUS SULFATE 325 (65 FE) MG PO TABS
325.0000 mg | ORAL_TABLET | Freq: Three times a day (TID) | ORAL | Status: DC
  Administered 2012-11-03 – 2012-11-04 (×3): 325 mg via ORAL
  Filled 2012-11-03 (×6): qty 1

## 2012-11-03 MED ORDER — AMLODIPINE BESYLATE 5 MG PO TABS
5.0000 mg | ORAL_TABLET | Freq: Every day | ORAL | Status: DC
  Administered 2012-11-03 – 2012-11-04 (×2): 5 mg via ORAL
  Filled 2012-11-03 (×2): qty 1

## 2012-11-03 NOTE — Progress Notes (Signed)
Subjective:    Interval Events:  Patient feels much better. She is now only slightly dizzy and she stands but this resolves after a few seconds. This dizziness is improved from yesterday. She says her appetite is good and she has been eating and drinking well. She also notes, as an aside, that she craves ice.     Objective:    Vital Signs:   Filed Vitals:   11/02/12 2012 11/02/12 2014 11/02/12 2017 11/03/12 0507  BP: 150/86 157/98 147/97 146/83  Pulse: 76 87 95 88  Temp: 98.3 F (36.8 C)   98.8 F (37.1 C)  TempSrc: Oral   Oral  Resp: 18   18  Height:      Weight:   162 lb 14.7 oz (73.9 kg)   SpO2: 100% 100% 100% 100%    Weights: 24-hour Weight change: -10.6 oz (-0.3 kg)  Filed Weights   10/31/12 1923 11/01/12 2105 11/02/12 2017  Weight: 161 lb 9.6 oz (73.3 kg) 163 lb 9.3 oz (74.2 kg) 162 lb 14.7 oz (73.9 kg)   Net since admission:  + 0.6 kg   Intake/Output:   Gross per 24 hour  Intake   2300 ml  Output      0 ml  Net   2300 ml     Last BM Date: 11/02/12   Physical Exam: GENERAL: alert and oriented; sitting up in bed and in no distress EYES: pupils equal, round, and reactive to light; sclera anicteric ENT: moist mucosa LUNGS: clear to auscultation bilaterally, normal work of breathing HEART: normal rate; regular rhythm ABDOMEN: soft, non-tender, normal bowel sounds, no masses palpated    Labs: Basic Metabolic Panel: Lab 10/31/12 1113 11/01/12 1013 11/02/12 0640 11/03/12 0612  NA 130* 137 135 137  K 4.2 4.1 4.1 3.8  CL 101 108 104 105  CO2 17* 19 18* 18*  GLUCOSE 88 76 77 77  BUN 22 16 11 13   CREATININE 1.57* 0.88 0.81 0.76  CALCIUM 9.7 9.2 9.3 9.5    CBC: Lab 11/01/12 1013 11/01/12 2109 11/02/12 0640 11/02/12 1600 11/03/12 0612  WBC 11.2* 12.4* 11.8* 13.1* 13.3*  HGB 7.5* 8.0* 7.9* 8.7* 8.0*  HCT 24.5* 26.6* 26.2* 28.5* 26.7*  MCV 68.8* 69.5* 69.3* 70.2* 70.4*  PLT 473* 498* 479* 491* 490*    Iron/TIBC/Ferritin Component Value  Date/Time   IRON 10* 10/31/2012 0810   TIBC 449 10/31/2012 0810   % SAT 2* 10/31/2012 0810   FERRITIN 2* 10/31/2012 0810   FOLATE 19.2 10/31/2012 0810   VITAMIN B12 338 10/31/2012 0810      Medications:    Infusions: . sodium chloride 75 mL/hr at 11/02/12 1952     Scheduled Medications: . ferrous sulfate  325 mg Oral Q breakfast  . pantoprazole  40 mg Oral Daily  . sodium chloride  3 mL Intravenous Q12H     PRN Medications: oxyCODONE-acetaminophen    Assessment/ Plan:    1.   Erosive gastritis and duodenitis:  Demonstrated on esophagogastroduodenoscopy on 11/01/2012. Gastroenterology recommends avoiding NSAIDs and starting daily PPI. They are comfortable with establishing the source of bleeding as upper GI. They do say that if her anemia does not improve, a colonoscopy will be needed. The patient refused a colonoscopy here in the hospital. - Continue pantoprazole 40 mg daily  2.   Iron deficiency anemia:  Iron panel 10/31/2012: iron 10, TIBC 449, percent saturation 2, and ferritin 2.  This may be multifactorial with blood loss through  heavy menstruation and upper GI blood loss from gastritis and duodenitis. Hemoglobin was 8 today down from 8.7 yesterday afternoon. - Increase oral ferrous sulfate to 325 mg TID with meals  3.   Acute kidney injury:  Resolved. Creatinine was elevated at 1.57 at admission. 0.76 today.  4.   Hypertension:  At home she was taking amlodipine 10 mg daily and lisinopril 20 mg daily.  Restart at discharge.  Will need follow up BMET in 1 to 2 weeks.  5.   Secondary amenorrhea:  Last menstrual period was November 2013.  Most likely menopause.  TSH normal.  Urine pregnancy test was not preformed this hospitalization, though unlikely, I think this needs to be done.  If negative, no further work up needed.  At 12 months, if no menstrual period, menopause can be diagnosed. - UPT  6.   Prophylaxis:  SCDs in the setting of anemia and possible GI bleeding.  7.    Disposition:  Discharge today or tomorrow.  Will need one time follow up with [facility] at least.    Length of Stay: 3 days   Signed by:  Dorthula Rue. Earlene Plater, MD PGY-I, Internal Medicine Pager [phone]  11/03/2012, 9:59 AM

## 2012-11-04 LAB — TYPE AND SCREEN
ABO/RH(D): B POS
Unit division: 0
Unit division: 0
Unit division: 0

## 2012-11-04 LAB — CBC
HCT: 28.6 % — ABNORMAL LOW (ref 36.0–46.0)
Hemoglobin: 8.7 g/dL — ABNORMAL LOW (ref 12.0–15.0)
MCH: 21.6 pg — ABNORMAL LOW (ref 26.0–34.0)
MCHC: 30.4 g/dL (ref 30.0–36.0)
RDW: 21.1 % — ABNORMAL HIGH (ref 11.5–15.5)

## 2012-11-04 MED ORDER — AMLODIPINE BESYLATE 5 MG PO TABS: 5.0000 mg | ORAL_TABLET | Freq: Every day | ORAL | Status: DC

## 2012-11-04 MED ORDER — FERROUS SULFATE 325 (65 FE) MG PO TABS: 325.0000 mg | ORAL_TABLET | Freq: Three times a day (TID) | ORAL | Status: DC

## 2012-11-04 NOTE — Progress Notes (Signed)
Subjective:    Interval Events:  Patient feels well this morning.  She is much less dizzy than when she arrived here and is a little less dizzy than yesterday.  Dizzy only when she stands, and even then for just a few seconds.  No abdominal pain.    Objective:    Vital Signs:   Filed Vitals:   11/03/12 1807 11/03/12 1819 11/03/12 2110 11/04/12 0542  BP: 164/104 151/97 146/90 110/69  Pulse: 82 84 81 76  Temp: 97.8 F (36.6 C)  98.4 F (36.9 C) 99.1 F (37.3 C)  TempSrc: Oral  Oral Oral  Resp: 18  18 18   Height:      Weight:   164 lb (74.39 kg)   SpO2: 100%  100% 95%    Weights: [employer]   11/01/12 2105 11/02/12 2017 11/03/12 2110  Weight: 163 lb 9.3 oz (74.2 kg) 162 lb 14.7 oz (73.9 kg) 164 lb (74.39 kg)    Intake/Output:   Gross per 24 hour  Intake    780 ml  Output      0 ml  Net    780 ml     Last BM Date: 11/03/12   Physical Exam: GENERAL: alert and oriented; resting comfortably in bed and in no distress EYES: pupils equal, round, and reactive to light; sclera anicteric ENT: moist mucosa LUNGS: clear to auscultation bilaterally, normal work of breathing HEART: normal rate; regular rhythm ABDOMEN: soft, non-tender, normal bowel sounds, no masses palpated EXTREMITIES: no edema SKIN: normal turgor    Labs: CBC: Lab 10/31/12 1530 11/01/12 2109 11/02/12 0640 11/02/12 1600 11/03/12 0612 11/04/12 0645  WBC  --  12.4* 11.8* 13.1* 13.3* 11.7*  NEUTROABS 9.5*  --   --   --   --   --   HGB  --  8.0* 7.9* 8.7* 8.0* 8.7*  HCT  --  26.6* 26.2* 28.5* 26.7* 28.6*  MCV  --  69.5* 69.3* 70.2* 70.4* 71.1*  PLT  --  498* 479* 491* 490* 511*      Medications:    Infusions:     Scheduled Medications: . amLODipine  5 mg Oral Daily  . ferrous sulfate  325 mg Oral TID WC  . pantoprazole  40 mg Oral Daily  . sodium chloride  3 mL Intravenous Q12H     PRN Medications: oxyCODONE-acetaminophen    Assessment/ Plan:    1.   Erosive gastritis and  duodenitis:  Demonstrated on esophagogastroduodenoscopy on 11/01/2012. Gastroenterology recommends avoiding NSAIDs and starting daily PPI. They are comfortable with establishing the source of bleeding as upper GI. They do say that if her anemia does not improve, a colonoscopy will be needed. The patient refused a colonoscopy here in the hospital. - Continue pantoprazole 40 mg daily  2.   Iron deficiency anemia:  Iron panel 10/31/2012: iron 10, TIBC 449, percent saturation 2, and ferritin 2.  This may be multifactorial with blood loss through prior heavy menstruation, upper GI blood loss from gastritis and duodenitis, and poor dietary intake. Hemoglobin was 8.7 today up from 8.0 yesterday.  I think her marrow is finally responding to iron supplementation. - Continue oral ferrous sulfate 325 mg TID with meals  3.   Acute kidney injury:  Resolved. Creatinine was elevated at 1.57 at admission. 0.76 yesterday.  4.   Hypertension:  At home she was taking amlodipine 10 mg daily and lisinopril 20 mg daily.  Amlodipine 5 mg was started last night.  We will continue amlodipine 5 mg daily for now and hold off on increasing to 10 mg or restarting lisinopril. - Continue amlodipine 5 mg daily - Hold lisinopril  5.   Secondary amenorrhea:  Last menstrual period was November 2013.  Most likely menopause.  TSH normal.  Urine pregnancy test negative.  No further work-up needed.  At 12 months, if no menstrual period, menopause can be diagnosed.  6.   Prophylaxis:  SCDs in the setting of anemia and possible GI bleeding.  7.   Disposition:  Discharge today.  Will need one time follow up with [facility] at least.    Length of Stay: 4 days   Signed by:  Dorthula Rue. Earlene Plater, MD PGY-I, Internal Medicine Pager [phone]  11/04/2012, 7:27 AM

## 2012-11-04 NOTE — Progress Notes (Signed)
Patient discharged to home. Patient AVS reviewed. Patient verbalized understanding of medications and follow-up appointments.  Patient remains stable; no signs or symptoms of distress.  Patient educated to return to the ER in cases of exacerbation of admitting symptoms, SOB, dizziness, fever, chest pain, or fainting.   

## 2012-11-04 NOTE — Discharge Summary (Signed)
Patient Name:  Stacy Anderson MRN: [id number]  PCP: No Pcp Per Patient DOB:  January 28, 1966       Date of Admission:  10/31/2012  Date of Discharge:  11/04/2012      Attending Physician: Inez Catalina, MD         DISCHARGE DIAGNOSES: 1. Erosive gastritis and duodenitis, stable 2. Iron deficiency anemia, improving 3. Acute kidney injury, resolved 4. Hypertension, stable 5. Secondary amenorrhea   DISPOSITION AND FOLLOW-UP: Stacy Anderson is to follow-up with the listed providers as detailed below, at which time, the following should be addressed:  1. Follow-up visits: 1. ANEMIA:  Pt should continue taking ferrous sulfate 3 times a day. Discuss risk and benefits of colonoscopy. 2. PRIMARY CARE: Patient will be establishing with [place]. She will need to meet with Rudell Cobb for financial assistance/counseling. 3. HTN: Lisinopril stopped. Amlodipine dose cut to 5 mg daily. Assess control. 4. GASTRITIS: Patient should still be on omeprazole 40 mg daily. 5. AMENORRHEA: No further workup needed. If no menstruation by November, diagnosis menopause.  2. Labs and images needed: 1. CBC  3. Pending labs and tests needing follow-up: 1. NONE   Follow-up Information   Follow up with Almyra Deforest, MD On 11/07/2012. (INTERNAL MEDICINE - Your appointment is on Thursday, May 22nd at 3:45. )    Contact information:   [address] [place] [address] [phone]      Discharge Orders   Future Appointments Provider Department Dept Phone   11/07/2012 3:45 PM Almyra Deforest, MD [facility] [phone]   Future Orders Complete By Expires     Call MD for:  extreme fatigue  As directed     Call MD for:  persistant dizziness or light-headedness  As directed     Diet general  As directed     Increase activity slowly  As directed         DISCHARGE MEDICATIONS:   Medication List    STOP taking these medications       Aspirin-Caffeine 500-32 MG Tabs     lisinopril 20 MG tablet  Commonly known as:  PRINIVIL,ZESTRIL      TAKE these medications       amLODipine 5 MG tablet  Commonly known as:  NORVASC  Take 1 tablet (5 mg total) by mouth daily.     BIOTENE MOISTURIZING MOUTH Soln  Use as directed 1-2 sprays in the mouth or throat daily as needed (dryness).     ferrous sulfate 325 (65 FE) MG tablet  Take 1 tablet (325 mg total) by mouth 3 (three) times daily with meals.     omeprazole 20 MG capsule  Commonly known as:  PRILOSEC  Take 2 capsules (40 mg total) by mouth daily.         CONSULTS: 1.   GASTROENTEROLOGY   PROCEDURES PERFORMED:  Ct Head Wo Contrast 10/31/2012   FINDINGS: Ventricle size is normal.  Negative for acute infarct, hemorrhage, or mass lesion.  No edema or midline shift.  Sclerotic changes are present in the skull base, similar to 2008. Question radiation change or fibrous dysplasia.   There is also chronic sinusitis with mucosal edema and bony thickening of the maxillary sinus bilaterally.  Mastoid sinus is clear bilaterally.   IMPRESSION: No acute intracranial abnormality.  Sclerotic changes in the skull base unchanged.  Question fibrous dysplasia or post radiation change.  The patient has a history of neoplasm of the nasal  cavity.  Esophagogastroduodenoscopy 11/01/2012 FINDINGS: A moderate erosive gastritis was identified. Multiple erosions were confluent. There was no evidence of any active or recent bleeding. In duodenal bulb there was evidence of duodenitis. Cold biopsies of the gastric lumen were obtained. No other abnormalities identified. Retroflexed views revealed no abnormalities. The scope was then withdrawn from the patient and the procedure terminated. IMPRESSION: 1. Erosive gastritis. 2. Duodenitis. RECOMMENDATIONS: 1. Avoid NSAIDs. 2. PPI daily. 3. The source of the bleeding appears to be from the upper GI tract, however, if the anemia does not resolve a colonoscopy will be required. She was  supposed to have a colonoscopy with the EGD but she did not prep as she refused the procedure.     ADMISSION DATA: H&P: 47yo F with PMH nasal tumor s/p chemo/rad at the age of 47, hearing loss 2/2 cancer therapy, HTN, depression, and GERD who presents to the ED with dizziness and SOB x 1-2 weeks worse with exertion, that has worsened over the past 24hrs.  She has a h/o of anemia of unknown etiology with no previous GI work up. 3/11 she was seen in the ED for a medication refill and was incidentally found to have a Hgb of 7.7. She was completely asymptomatic at this time, and was told to f/u with her PCP for a complete workup which was not done.  She was recently seen in the ED on 10/23/12 for chest pain and dysphagia. No labs were performed, but she was diagnosed with esophagitis and prescribed Prilosec and told to avoid coffee.  Today in the ED, she denies blood in her stool or on the toilet paper, black tarry stools, hematemesis or coffee ground emesis, no family or personal h/o of bleeding d/o, recent trauma, or having a menstrual period in 8 months. She does endorse constant heartburn and nausea that is improved with the Prilosec prescribed at her last ED visit, but her sx are not completely resolved. She is also still experiencing dysphagia. She does c/o of vision changes, a "white out" effect, with her dizziness that are improved with sunglasses.  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 She endorses taking 800mg  of Ibuprofen twice a day x 6-20months.  She does smoke 1 cigar a week over the past 2 ys but denies any alcohol or drug use.   Physical Exam: Vitals: Blood pressure 126/82, pulse 88, temperature 98.6 F (37 C), temperature source Oral, resp. rate 18, height 5\' 8"  (1.727 m), weight 170 lb (77.111 kg), last menstrual period 05/17/2012, SpO2 97.00%.  General: Well-developed, and cooperative on examination.  Head: Normocephalic and  atraumatic.  Eyes: Pupils equal, round, and reactive to light, no injection and anicteric. Conjunctiva pale Mouth: Pharynx pink with thick saliva, no exudates.  Neck: Supple, full ROM, no thyromegaly, no JVD, and no carotid bruits.  Lungs: CTAB, normal respiratory effort, no accessory muscle use, no crackles, and no wheezes. Heart: Regular rate, regular rhythm, no murmur, no gallop, and no rub.  Abdomen: Soft, non-tender, non-distended, normal bowel sounds, no guarding, no rebound tenderness, no organomegaly.  Msk: No joint swelling, warmth, or erythema. Slow capillary refill.  Extremities: 2+ radial and DP pulses bilaterally. No cyanosis, clubbing, edema Neurologic: Alert & oriented X3, cranial nerves II-XII intact- hearing grossly intact with hearing aids in both ears and required to speak in an elevated voice, strength normal in all extremities, sensation intact to light touch. Skin:  Turgor normal and no rashes.  Psych: Normal mood and affect   Labs: Basic Metabolic Panel:   10/31/12 1113   NA  130*   K  4.2   CL  101   CO2  17*   GLUCOSE  88   BUN  22   CREATININE  1.57*   CALCIUM  9.7     CBC:   10/31/12 1113  10/31/12 1530   WBC  14.2*  --   NEUTROABS  --  9.5*   HGB  5.4*  --   HCT  19.3*  --   MCV  65.6*  --   PLT  562*  --     Anemia Panel:   10/31/12 0810   RETICCTPCT  1.3     Iron/TIBC/Ferritin  Component  Value  Date/Time    IRON  10*  10/31/2012 0810    TIBC  449  10/31/2012 0810    % SAT  2*  10/31/2012 0810    FERRITIN  2*  10/31/2012 0810    FOLATE  19.2  10/31/2012 0810    VITAMIN B12  338  10/31/2012 0810      Misc Labs:  10/31/12:  Hemoccult negative   HOSPITAL COURSE: 1.   Iron deficiency anemia:  Patient presented with a hemoglobin of 5.4 and no obvious source of bleeding. Other cell lines normal. She was severely iron deficient with an iron of 10, ferritin of 2, and percent saturation of 2. No evidence of hemolysis. Folate and B12 normal.  She received 2 units of packed red blood cells and was started on hypotonic drip. Gastroenterology was consulted and esophagogastroduodenoscopy revealed gastritis and duodenitis. Gastroenterology was comfortable assuming the source of blood loss to be upper GI. Hemoglobin remained stable in the range of 7.5-8. After several days of oral iron supplementation, her hemoglobin rose to 8.7. She was discharged home on omeprazole 40 mg daily and ferrous sulfate 325 mg 3 times a day. At followup, please discussing the risks and benefits of outpatient colonoscopy with the patient.  2.   Erosive gastritis and duodenitis:  Observed on esophagogastroduodenoscopy during this admission. Patient was on pantoprazole here in the hospital and sent home with omeprazole 40 mg daily. She was taking aspirin-caffeine as an outpatient. She was instructed to stop this.  3.   AKI:  Creatinine elevated at 1.57 at admission. It was down to 0.76 by discharge. Most likely prerenal from dehydration. She was orthostatic at admission.  4.   Hypertension:  At home she was taking amlodipine 10 mg daily and lisinopril 20 mg daily. Both were held at admission for low blood pressures. Late in her hospitalization, she began to have some hypertensive blood pressures. Amlodipine 5 mg was restarted. We discharged with amlodipine 5 mg daily. Instructed to stop the lisinopril for now. At followup, evaluate control of her blood pressure.  5.   Secondary amenorrhea:  Last menstrual period was November 2013. Most likely menopause. TSH normal. Urine pregnancy test negative. No further work-up needed. At 12 months, if no menstrual period, menopause can be diagnosed   DISCHARGE DATA: Vital Signs: BP 142/90  Pulse 90  Temp(Src) 98.2 F (36.8 C) (Oral)  Resp 18  Ht 5\' 8"  (1.727 m)  Wt 164 lb (74.39 kg)  BMI 24.94 kg/m2  SpO2 100%  LMP 05/17/2012  Labs: Results for orders placed during the hospital encounter of 10/31/12 (from the past 24  hour(s))  PREGNANCY, URINE     Status:  None   Collection Time    11/03/12 11:38 AM      Result Value Range   Preg Test, Ur NEGATIVE  NEGATIVE  CBC     Status: Abnormal   Collection Time    11/04/12  6:45 AM      Result Value Range   WBC 11.7 (*) 4.0 - 10.5 K/uL   RBC 4.02  3.87 - 5.11 MIL/uL   Hemoglobin 8.7 (*) 12.0 - 15.0 g/dL   HCT 16.1 (*) 09.6 - 04.5 %   MCV 71.1 (*) 78.0 - 100.0 fL   MCH 21.6 (*) 26.0 - 34.0 pg   MCHC 30.4  30.0 - 36.0 g/dL   RDW 40.9 (*) 81.1 - 91.4 %   Platelets 511 (*) 150 - 400 K/uL     Time spent on discharge: 31 minutes   Services Ordered on Discharge: 1. PT - no 2. OT - no 3. RN - no 4. Other - no   Signed by:  Dorthula Rue. Earlene Plater, MD PGY-I, Internal Medicine  11/04/2012, 11:52 AM

## 2012-11-04 NOTE — Progress Notes (Signed)
Medical Student Daily Progress Note  Subjective: Hypertensive to 164/104 overnight.  Started on amlodipine 5 mg.  Otherwise NAE overnight.  C/o nonproductive cough this AM, otherwise asymptomatic.    Objective: Vital signs in last 24 hours: Filed Vitals:   11/03/12 1807 11/03/12 1819 11/03/12 2110 11/04/12 0542  BP: 164/104 151/97 146/90 110/69  Pulse: 82 84 81 76  Temp: 97.8 F (36.6 C)  98.4 F (36.9 C) 99.1 F (37.3 C)  TempSrc: Oral  Oral Oral  Resp: 18  18 18   Height:      Weight:   74.39 kg (164 lb)   SpO2: 100%  100% 95%   Weight change: 0.49 kg (1 lb 1.3 oz)  Intake/Output Summary (Last 24 hours) at 11/04/12 0848 Last data filed at 11/04/12 0300  Gross per 24 hour  Intake    480 ml  Output      0 ml  Net    480 ml    Physical Exam: BP 110/69  Pulse 76  Temp(Src) 99.1 F (37.3 C) (Oral)  Resp 18  Ht 5\' 8"  (1.727 m)  Wt 74.39 kg (164 lb)  BMI 24.94 kg/m2  SpO2 95%  LMP 05/17/2012 General appearance: alert, cooperative, appears older than stated age and no distress  Head: Normocephalic, without obvious abnormality, atraumatic Eyes: EOMI. Sclera midly icteric.  Ears: BL hearing aids in place.  Nose: deviated septum  Neck: no adenopathy, no JVD, supple, symmetrical, trachea midline and thyroid not enlarged, symmetric, no tenderness/mass/nodules. No carotid bruit.  Lungs: clear to auscultation bilaterally  Heart: regular rate and rhythm, S1, S2 normal, no murmur, click, rub or gallop  Abdomen: soft, non-tender; bowel sounds normal; no masses, no organomegaly  Extremities: extremities normal, atraumatic, no cyanosis or edema  Pulses: 2+ and symmetric  Skin: Skin color, texture, turgor normal. No rashes or lesions  Neurologic: Grossly normal   Lab Results: Basic Metabolic Panel:  Recent Labs  72/53/66 0640 11/03/12 0612  NA 135 137  K 4.1 3.8  CL 104 105  CO2 18* 18*  GLUCOSE 77 77  BUN 11 13  CREATININE 0.81 0.76  CALCIUM 9.3 9.5   Liver  Function Tests:  Recent Labs  11/01/12 1013 11/02/12 0640  AST 16 18  ALT 9 7  ALKPHOS 65 71  BILITOT 0.8 0.2*  PROT 6.7 7.1  ALBUMIN 3.4* 3.3*   No results found for this basename: LIPASE, AMYLASE,  in the last 72 hours No results found for this basename: AMMONIA,  in the last 72 hours CBC:  Recent Labs  11/03/12 0612 11/04/12 0645  WBC 13.3* 11.7*  HGB 8.0* 8.7*  HCT 26.7* 28.6*  MCV 70.4* 71.1*  PLT 490* 511*   Cardiac Enzymes: No results found for this basename: CKTOTAL, CKMB, CKMBINDEX, TROPONINI,  in the last 72 hours BNP: No results found for this basename: PROBNP,  in the last 72 hours D-Dimer: No results found for this basename: DDIMER,  in the last 72 hours CBG: No results found for this basename: GLUCAP,  in the last 72 hours Hemoglobin A1C: No results found for this basename: HGBA1C,  in the last 72 hours Fasting Lipid Panel: No results found for this basename: CHOL, HDL, LDLCALC, TRIG, CHOLHDL, LDLDIRECT,  in the last 72 hours Thyroid Function Tests:  Recent Labs  11/02/12 0640  TSH 3.155   Anemia Panel: No results found for this basename: VITAMINB12, FOLATE, FERRITIN, TIBC, IRON, RETICCTPCT,  in the last 72 hours Coagulation: No results  found for this basename: LABPROT, INR,  in the last 72 hours Urine Drug Screen: Drugs of Abuse  No results found for this basename: labopia, cocainscrnur, labbenz, amphetmu, thcu, labbarb    Alcohol Level: No results found for this basename: ETH,  in the last 72 hours Urinalysis: No results found for this basename: COLORURINE, APPERANCEUR, LABSPEC, PHURINE, GLUCOSEU, HGBUR, BILIRUBINUR, KETONESUR, PROTEINUR, UROBILINOGEN, NITRITE, LEUKOCYTESUR,  in the last 72 hours   Micro Results: No results found for this or any previous visit (from the past 240 hour(s)).  Studies/Results: No results found.  Medications: I have reviewed the patient's current medications. Scheduled Meds: . amLODipine  5 mg Oral Daily   . ferrous sulfate  325 mg Oral TID WC  . pantoprazole  40 mg Oral Daily  . 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   Principal Problem:   Acute blood loss anemia Active Problems:   HEARING LOSS   HYPERTENSION, BENIGN ESSENTIAL   Duodenitis   Anemia, iron deficiency   Erosive gastritis   LOS: 4 days   Microcytic Anemia  -Etiology likely related to Fe deficiency and erosive gastritis and duodenitis identified on EGD, though there was no evidence of active or recent bleeding.   -Pt declined colonoscopy during admission but recommend obtaining colonoscopy as outpatient.    -H/H stable at 8.7/28.6 from 8/26.7.  Appears to be responding well to Fe supplementation.   -Continue Fe sulfate 325 TID. -Repeat CBC as outpatient.   Erosive Gastritis and Duodenitis  -Avoid NSAIDs.  -PO protonix 40 mg daily.   HTN  -Moderate HTN overnight. -Continue amlodipine 5 mg.  Hold lisinopril.   AKI  -Resolved. Likely 2/2 dehydration.  -Regular diet. Encourage PO intake.  -Repeat CMP as outpatient.   Code: full   Prophylaxis: SCDs   Dispo: floor status  -Anticipated date of discharge: today   This is a Psychologist, occupational Note.  The care of the patient was discussed with Dr. Abner Greenspan and the assessment and plan formulated with their assistance.  Please see their attached note for official documentation of the daily encounter.  Harland Dingwall [facility] 11/04/2012, 8:48 AM   Agree with above.  See my note for additional documentation.  Signed by:  Dorthula Rue. Earlene Plater, MD PGY-I, Internal Medicine Pager [phone]  11/04/2012, 11:34 AM

## 2012-11-07 ENCOUNTER — Encounter (HOSPITAL_COMMUNITY): Payer: Self-pay | Admitting: Internal Medicine

## 2012-11-07 ENCOUNTER — Ambulatory Visit (HOSPITAL_COMMUNITY)
Admission: RE | Admit: 2012-11-07 | Discharge: 2012-11-07 | Disposition: A | Discharge status: Home / Self Care | Payer: Self-pay | Source: Ambulatory Visit | Attending: Internal Medicine | Admitting: Internal Medicine

## 2012-11-07 ENCOUNTER — Ambulatory Visit: Payer: Self-pay

## 2012-11-07 ENCOUNTER — Observation Stay (HOSPITAL_COMMUNITY)
Admission: AD | Admit: 2012-11-07 | Discharge: 2012-11-08 | Disposition: A | Discharge status: Home / Self Care | Payer: MEDICAID | Source: Ambulatory Visit | Attending: Internal Medicine | Admitting: Internal Medicine

## 2012-11-07 ENCOUNTER — Encounter: Payer: Self-pay | Admitting: Internal Medicine

## 2012-11-07 ENCOUNTER — Ambulatory Visit (INDEPENDENT_AMBULATORY_CARE_PROVIDER_SITE_OTHER): Payer: Self-pay | Admitting: Internal Medicine

## 2012-11-07 VITALS — BP 122/87 | HR 90 | Temp 98.6°F | Ht 66.0 in | Wt 169.7 lb

## 2012-11-07 DIAGNOSIS — R0902 Hypoxemia: Secondary | ICD-10-CM | POA: Diagnosis present

## 2012-11-07 DIAGNOSIS — I951 Orthostatic hypotension: Principal | ICD-10-CM | POA: Diagnosis present

## 2012-11-07 DIAGNOSIS — D509 Iron deficiency anemia, unspecified: Secondary | ICD-10-CM

## 2012-11-07 DIAGNOSIS — K5900 Constipation, unspecified: Secondary | ICD-10-CM | POA: Diagnosis present

## 2012-11-07 DIAGNOSIS — I519 Heart disease, unspecified: Secondary | ICD-10-CM | POA: Diagnosis present

## 2012-11-07 DIAGNOSIS — N912 Amenorrhea, unspecified: Secondary | ICD-10-CM | POA: Insufficient documentation

## 2012-11-07 DIAGNOSIS — D7589 Other specified diseases of blood and blood-forming organs: Secondary | ICD-10-CM | POA: Diagnosis present

## 2012-11-07 DIAGNOSIS — K2980 Duodenitis without bleeding: Secondary | ICD-10-CM | POA: Diagnosis present

## 2012-11-07 DIAGNOSIS — K2960 Other gastritis without bleeding: Secondary | ICD-10-CM | POA: Diagnosis present

## 2012-11-07 DIAGNOSIS — I10 Essential (primary) hypertension: Secondary | ICD-10-CM | POA: Diagnosis present

## 2012-11-07 DIAGNOSIS — H9190 Unspecified hearing loss, unspecified ear: Secondary | ICD-10-CM | POA: Diagnosis present

## 2012-11-07 LAB — CBC WITH DIFFERENTIAL/PLATELET
Basophils Absolute: 0 10*3/uL (ref 0.0–0.1)
Eosinophils Relative: 10 % — ABNORMAL HIGH (ref 0–5)
HCT: 30.2 % — ABNORMAL LOW (ref 36.0–46.0)
Hemoglobin: 9.1 g/dL — ABNORMAL LOW (ref 12.0–15.0)
Lymphocytes Relative: 24 % (ref 12–46)
Lymphs Abs: 3.1 10*3/uL (ref 0.7–4.0)
MCV: 73.7 fL — ABNORMAL LOW (ref 78.0–100.0)
Monocytes Absolute: 0.6 10*3/uL (ref 0.1–1.0)
Monocytes Relative: 5 % (ref 3–12)
Neutro Abs: 8.1 10*3/uL — ABNORMAL HIGH (ref 1.7–7.7)
RBC: 4.1 MIL/uL (ref 3.87–5.11)
RDW: 23.9 % — ABNORMAL HIGH (ref 11.5–15.5)
WBC: 13.2 10*3/uL — ABNORMAL HIGH (ref 4.0–10.5)

## 2012-11-07 LAB — RETICULOCYTES: ABS Retic: 79.1 10*3/uL (ref 19.0–186.0)

## 2012-11-07 MED ORDER — IOHEXOL 350 MG/ML SOLN
100.0000 mL | Freq: Once | INTRAVENOUS | Status: AC | PRN
  Administered 2012-11-07: 100 mL via INTRAVENOUS

## 2012-11-07 MED ORDER — PANTOPRAZOLE SODIUM 40 MG PO TBEC
40.0000 mg | DELAYED_RELEASE_TABLET | Freq: Every day | ORAL | Status: DC
  Administered 2012-11-08: 40 mg via ORAL
  Filled 2012-11-07: qty 1

## 2012-11-07 MED ORDER — BIOTENE DRY MOUTH MT LIQD: 15.0000 mL | OROMUCOSAL | Status: DC | PRN

## 2012-11-07 MED ORDER — SODIUM CHLORIDE 0.9 % IV SOLN
INTRAVENOUS | Status: DC
  Administered 2012-11-07: 22:00:00 via INTRAVENOUS

## 2012-11-07 MED ORDER — FERROUS SULFATE 325 (65 FE) MG PO TABS
325.0000 mg | ORAL_TABLET | Freq: Three times a day (TID) | ORAL | Status: DC
  Administered 2012-11-08 (×2): 325 mg via ORAL
  Filled 2012-11-07 (×4): qty 1

## 2012-11-07 MED ORDER — ALUM & MAG HYDROXIDE-SIMETH 200-200-20 MG/5ML PO SUSP
30.0000 mL | Freq: Four times a day (QID) | ORAL | Status: DC | PRN
  Filled 2012-11-07: qty 30

## 2012-11-07 MED ORDER — ACETAMINOPHEN 325 MG PO TABS: 650.0000 mg | ORAL_TABLET | Freq: Four times a day (QID) | ORAL | Status: DC | PRN

## 2012-11-07 MED ORDER — SENNA 8.6 MG PO TABS
1.0000 | ORAL_TABLET | Freq: Every day | ORAL | Status: DC | PRN
  Filled 2012-11-07: qty 1

## 2012-11-07 MED ORDER — SODIUM CHLORIDE 0.9 % IV SOLN
INTRAVENOUS | Status: DC
  Administered 2012-11-07: 20:00:00 via INTRAVENOUS

## 2012-11-07 MED ORDER — ONDANSETRON HCL 4 MG/2ML IJ SOLN: 4.0000 mg | Freq: Four times a day (QID) | INTRAMUSCULAR | Status: DC | PRN

## 2012-11-07 MED ORDER — SODIUM CHLORIDE 0.9 % IJ SOLN: 3.0000 mL | Freq: Two times a day (BID) | INTRAMUSCULAR | Status: DC

## 2012-11-07 MED ORDER — BIOTENE MOISTURIZING MOUTH MT SOLN: 1.0000 | Freq: Every day | OROMUCOSAL | Status: DC | PRN

## 2012-11-07 MED ORDER — ACETAMINOPHEN 650 MG RE SUPP: 650.0000 mg | Freq: Four times a day (QID) | RECTAL | Status: DC | PRN

## 2012-11-07 MED ORDER — ONDANSETRON HCL 4 MG PO TABS: 4.0000 mg | ORAL_TABLET | Freq: Four times a day (QID) | ORAL | Status: DC | PRN

## 2012-11-07 MED ORDER — ENOXAPARIN SODIUM 40 MG/0.4ML ~~LOC~~ SOLN
40.0000 mg | SUBCUTANEOUS | Status: DC
  Administered 2012-11-07: 40 mg via SUBCUTANEOUS
  Filled 2012-11-07 (×2): qty 0.4

## 2012-11-07 NOTE — Assessment & Plan Note (Signed)
Patient was noted to be orthostatic and felt dizzy when standing up. Systolic blood pressure dropped from lying 145 to 122 standing. Patient denies any active bleeding. Will obtain CBC. Patient currently was taking amlodipine 5 mg daily. Denies any nausea vomiting or decreased by mouth intake. Will admit patient for further evaluation and management.

## 2012-11-07 NOTE — Progress Notes (Signed)
Subjective:   Patient ID: Stacy Anderson female   DOB: 1966/03/15 47 y.o.   MRN: [id number]  HPI: [name] is a 47 y.o. female with past history significant as outlined below who presented to the clinic to establish care. Patient was hospitalized on 5/15 for acute blood loss anemia.  EGD performed on hospital day 2 demonstrated gastritis/duodenitis without signs of active bleeding. Pt refused colonoscopy Anemia multifactorial due to NSAID erosive gastritis/duodenitis as well as decreased dietary intake of iron.  Patient today noticed that she has been experiencing some dizziness and shortness of breath with just walking small distances. Denies any headache, vision changes, chest pain, nausea vomiting, diarrhea, abdominal pain, blood in the stool, blood in the urine. She has been taking her iron tablets on a regular basis.   Past Medical History  Diagnosis Date  . Hypertension   . Sensorineural hearing loss of both ears     likely secondary to the radiation therapy, chemotherapy and the vascular flow issues  . Left carotid artery stenosis   . Herpes genitalis 12/30/2008  . Malignant tumor of maxillary sinus 1989    treated with chemotherapy and radiation   Current Outpatient Prescriptions  Medication Sig Dispense Refill  . amLODipine (NORVASC) 5 MG tablet Take 1 tablet (5 mg total) by mouth daily.  30 tablet  0  . Artificial Saliva (BIOTENE MOISTURIZING MOUTH) SOLN Use as directed 1-2 sprays in the mouth or throat daily as needed (dryness).      . ferrous sulfate 325 (65 FE) MG tablet Take 1 tablet (325 mg total) by mouth 3 (three) times daily with meals.  90 tablet  0  . omeprazole (PRILOSEC) 20 MG capsule Take 2 capsules (40 mg total) by mouth daily.  30 capsule  0   No current facility-administered medications for this visit.   No family history on file. History   Social History  . Marital Status: Single    Spouse Name: N/A    Number of Children: N/A  . Years of  Education: N/A   Social History Main Topics  . Smoking status: Never Smoker   . Smokeless tobacco: None  . Alcohol Use: No  . Drug Use: No  . Sexually Active: None   Other Topics Concern  . None   Social History Narrative  . None   Review of Systems: Constitutional: Denies fever, chills, diaphoresis, appetite change and fatigue.  Respiratory: Noted SOB, DOE and cough but denies  chest tightness,  and wheezing  Cardiovascular: Denies chest pain, palpitations and leg swelling.  Gastrointestinal: Denies nausea, vomiting, abdominal pain, diarrhea, constipation, blood in stool and abdominal distention.  Genitourinary: Denies dysuria, urgency, frequency, hematuria, flank pain and difficulty urinating.  Endocrine: Denies: hot or cold intolerance, sweats, changes in hair or nails, polyuria, polydipsia.  Skin: Denies pallor, rash and wound.  Neurological: Noted dizziness but denies syncope, weakness, numbness and headaches.  Hematological: Denies adenopathy. Easy bruising, personal or family bleeding history    Objective:  Physical Exam: Filed Vitals:   11/07/12 1426 11/07/12 1441 11/07/12 1442 11/07/12 1443  BP: 138/93 145/101 143/102 122/87  Pulse: 84 80 83 90  Temp: 98.6 F (37 C)     TempSrc: Oral     Height: 5\' 6"  (1.676 m)     Weight: 169 lb 11.2 oz (76.975 kg)     SpO2: 100%      Constitutional: Vital signs reviewed.  Patient is a well-developed and well-nourished female in no acute  distress and cooperative with exam. Alert and oriented x3.  Eyes: PERRL, EOMI, conjunctivae normal, No scleral icterus.  Neck: Supple,  Cardiovascular: RRR, S1 normal, S2 normal, no MRG, pulses symmetric and intact bilaterally Pulmonary/Chest: CTAB, no wheezes, rales, or rhonchi Abdominal: Soft. Non-tender, non-distended, bowel sounds are normal,  GU: no CVA tenderness Hematology: no cervical adenopathy.  Neurological: A&O x3, Strength is normal and symmetric bilaterally, cranial nerve II-XII  are grossly intact, no focal motor deficit, sensory intact to light touch bilaterally.  Skin: Warm, dry and intact. No rash, cyanosis, or clubbing.

## 2012-11-07 NOTE — H&P (Signed)
Date: 11/07/2012               Patient Name:  Stacy Anderson MRN: [id number]  DOB: May 06, 1966 Age / Sex: 47 y.o., female   PCP: No Pcp Per Patient              Medical Service: Internal Medicine Teaching Service              Attending Physician: Dr. Criselda Peaches    First Contact: Dr. Earlene Plater Pager: [phone]  Second Contact: Dr. Everardo Beals Pager: [phone]            After Hours (After 5p/  First Contact Pager: [phone]  weekends / holidays): Second Contact Pager: [phone]     Chief Complaint: lightheadedness  History of Present Illness: Patient is a 47 y.o. female with a PMHx of nasal tumor s/p chemo-radiation therapy at age 33yo, hearing loss 2/2 cancer therapy, depression and recent admission (5/15-5/19) for severe iron deficiency anemia (Hgb 5.4 on admission, requiring transfusion) secondary to erosive gastritis and duodenitis noted on EGD. [facility] day of admission for hospital followup and although her abdominal pain symptoms had stabilized, the patient did complain of new episodes of positional lightheadedness occuring since hospital discharge. Her orthostatic vitals were checked and found to be positive with SBP changing from 145 to 122 lying to standing, the cause of which was unclear. Additionally, the patient was ambulated with notable desaturation to 89% at room air and significant shortness of breath was experienced. The pt attributes her symptoms towards the rapid pace with which she was ambulated.   Otherwise, the pt indicates that she has been feeling well since discharge. Specifically, she has been eating 3 meals daily (which is actually on improvement for her), drinking a lot of fluids. Additionally, she has not experienced decreased oral intake, shortness of breath, palpitations, chest pain, hemoptysis, nausea/vomiting, blood in stool or urine, vertigo.  She has been taking her Omeprazole regularly without missing doses, which she  finds to be helpful. She discontinued the lisinopril that was recommended for DC during the last admission.   Review of Systems: Constitutional:  denies fever, chills, diaphoresis, appetite change and fatigue.  HEENT: denies eye pain or discharge, ear pain or discharge, sore throat, nasal congestion.  Respiratory: denies SOB, DOE, cough, chest tightness, and wheezing.  Cardiovascular: denies chest pain, palpitations and leg swelling.  Gastrointestinal: denies nausea, vomiting, abdominal pain, diarrhea, constipation, blood in stool.  Genitourinary: denies dysuria, urgency, frequency, hematuria, flank pain and difficulty urinating.  Musculoskeletal: admits to neck pain. Denies myalgias,  joint swelling, arthralgias and gait problem.   Skin: denies pallor, rash and wound.  Neurological: admits to positional lightheadedness,  Denies dizziness, seizures, syncope, weakness, numbness and headaches.   Hematological: denies obvious sources of active bleeding, personal or family bleeding history.  Psychiatric/ Behavioral: denies depression, anxiety, homicidal / suicidal ideation.    Current Outpatient Medications: Medication Sig  . amLODipine (NORVASC) 5 MG tablet Take 1 tablet (5 mg total) by mouth daily.  . Artificial Saliva (BIOTENE MOISTURIZING MOUTH) SOLN Use as directed 1-2 sprays in the mouth or throat daily as needed (dryness).  . ferrous sulfate 325 (65 FE) MG tablet Take 1 tablet (325 mg total) by mouth 3 (three) times daily with meals.  Marland Kitchen omeprazole (PRILOSEC) 20 MG capsule Take 2 capsules (40 mg total) by mouth daily.    Allergies: Allergies  Allergen Reactions  . Asa (Aspirin) Other (  See Comments)    Nose bleed.     Past Medical History: Past Medical History  Diagnosis Date  . Hypertension   . Sensorineural hearing loss of both ears     likely secondary to the radiation therapy, chemotherapy and the vascular flow issues  . Left carotid artery stenosis   . Herpes genitalis  12/30/2008  . Malignant tumor of maxillary sinus 1989    treated with chemotherapy and radiation    Past Surgical History: Past Surgical History  Procedure Laterality Date  . Tubal ligation Bilateral 04/03/2003    Dr. Sylvester Harder  . Exploratory tympanotomy Right 06/05/2007    Hermelinda Medicus, M.D.  . Esophagogastroduodenoscopy N/A 11/01/2012    Procedure: ESOPHAGOGASTRODUODENOSCOPY (EGD);  Surgeon: Theda Belfast, MD;  Location: [facility] ENDOSCOPY;  Service: Endoscopy;  Laterality: N/A;    Family History: Family History  Problem Relation Age of Onset  . Hypertension Mother   . Heart disease Mother   . Diabetes Father   . Diabetes Sister   . Cancer Brother 49    Brain cancer     Social History: History   Social History  . Marital Status: Single    Spouse Name: N/A    Number of Children: 0  . Years of Education: 12th grade   Occupational History  . [employer]   Social History Main Topics  . Smoking status: Former Smoker -- 0.25 packs/day for 20 years    Types: Cigarettes    Quit date: 09/07/2012  . Smokeless tobacco: Not on file  . Alcohol Use: No  . Drug Use: No  . Sexually Active: Not on file   Other Topics Concern  . Not on file   Social History Narrative   Lives in [place], alone     Vital Signs: Blood pressure 135/94, pulse 89, temperature 97.7 F (36.5 C), temperature source Oral, resp. rate 18, height 5\' 6"  (1.676 m), weight 166 lb 10.7 oz (75.6 kg), last menstrual period 05/17/2012, SpO2 100.00%.  Physical Exam: General: Vital signs reviewed and noted. Well-developed, well-nourished, in no acute distress; alert, appropriate and cooperative throughout examination.  Head: Normocephalic, atraumatic.  Eyes: PERRL, EOMI, No signs of anemia or jaundince.  Nose: Mucous membranes moist, not inflammed, nonerythematous.  Throat: Oropharynx nonerythematous, no exudate appreciated.   Neck: No deformities, masses, or tenderness noted.Supple, No  carotid Bruits, no JVD.  Lungs:  Normal respiratory effort. Clear to auscultation BL without crackles or wheezes.  Heart: RRR. S1 and S2 normal without gallop, murmur, or rubs.  Abdomen:  BS normoactive. Soft, Nondistended, non-tender.  No masses or organomegaly.  Extremities: No pretibial edema.  Neurologic: A&O X3, CN II - XII are grossly intact. Motor strength is 5/5 in the all 4 extremities, Sensations intact to light touch, Cerebellar signs negative.  Skin: No visible rashes, scars.    Lab results:  CURRENT LABS: CBC:    Component Value Date/Time   WBC 13.2* 11/07/2012 1520   HGB 9.1* 11/07/2012 1520   HCT 30.2* 11/07/2012 1520   PLT 572* 11/07/2012 1520   MCV 73.7* 11/07/2012 1520   NEUTROABS 8.1* 11/07/2012 1520   LYMPHSABS 3.1 11/07/2012 1520   MONOABS 0.6 11/07/2012 1520   EOSABS 1.3* 11/07/2012 1520   BASOSABS 0.0 11/07/2012 1520     Metabolic Panel:    Component Value Date/Time   NA 137 11/03/2012 0612   K 3.8 11/03/2012 0612   CL 105 11/03/2012 0612   CO2 18* 11/03/2012 0612  BUN 13 11/03/2012 0612   CREATININE 0.76 11/03/2012 0612   GLUCOSE 77 11/03/2012 0612   CALCIUM 9.5 11/03/2012 0612   AST 18 11/02/2012 0640   ALT 7 11/02/2012 0640   ALKPHOS 71 11/02/2012 0640   BILITOT 0.2* 11/02/2012 0640   PROT 7.1 11/02/2012 0640   ALBUMIN 3.3* 11/02/2012 0640     HISTORICAL LABS: Lab Results  Component Value Date   CHOL 225* 05/05/2010   HDL 64 05/05/2010   LDLCALC 139* 05/05/2010   TRIG 108 05/05/2010   CHOLHDL 3.5 Ratio 05/05/2010    Lab Results  Component Value Date   TSH 3.155 11/02/2012    Anemia Panel:   Ref. Range 10/31/2012   Iron Latest Range: 42-135 ug/dL 10 (L)  UIBC Latest Range: 125-400 ug/dL 960 (H)  TIBC Latest Range: 250-470 ug/dL 454  Saturation Ratios Latest Range: 20-55 % 2 (L)  Ferritin Latest Range: 10-291 ng/mL 2 (L)  Folate No range found 19.2    Imaging results:  No results found.   Other results:  EKG (11/07/2012) -  pending  Esophagogastroduodenoscopy (11/01/2012) - 1. Erosive gastritis. 2. Duodenitis. RECOMMENDATIONS: 1. Avoid NSAIDs. 2. PPI daily. 3. The source of the bleeding appears to be from the upper GI tract, however, if the anemia does not resolve a colonoscopy will be required. She was supposed to have a colonoscopy with the EGD but she did not prep as she refused the procedure.    Assessment & Plan:  Pt is a 47 y.o. yo female with a PMHx of  nasal tumor s/p chemo-radiation therapy at age 83yo, hearing loss 2/2 cancer therapy, depression and recent admission (5/15-5/19) for severe iron deficiency anemia (Hgb 5.4 on admission, requiring transfusion) secondary to erosive gastritis and duodenitis who was admitted on 11/07/2012 with symptoms of orthostatic hypotension and hypoxia, which is currently being evaluated.   Principal Problem:   Orthostatic hypotension Active Problems:   HEARING LOSS   HYPERTENSION, BENIGN ESSENTIAL   DIASTOLIC DYSFUNCTION   Duodenitis   Anemia, iron deficiency   Erosive gastritis   Hypoxia   Constipation   Assessment & Plan by Problem: 1. Orthostatic hypotension - cause unclear, likely orthostasis remains in setting of severe iron deficiency anemia, that seems be slowly stabilizing (notably pt has been having these positional lightheadedness sx over last 3 months). There is no obvious source of acute bleed and Hgb is stable from discharge. Amlodipine may further contribute towards this (although again, likely 2/2 to her anemia). Despite obvious concern for volume depletion, pt's oral intake history (3 meals daily + high water intake) may argue against this. She is also not on any diuretics to further contribute towards volume depletion.   Plan: - Admit to telemetry unit - Hold Norvasc 5 mg for now  - IVF Normal saline 100 cc/hr  - Full diet. - Recheck orthostatics in AM.    2. Dyspnea with exertion and hypoxia - again, these symptoms have been ongoing for several  months and likely secondary to recent severe blood loss anemia - which also likely accounts for the hypoxia on ambulation noted in clinic today. Despite initial concern for possible PE, Revised Geneva score of 3, indicating low probability. CTA chest was attempted, however, was unable to obtain 11/07/12 due to intravenous malfunction.  Notably, TSH was also recently wnl. - Continue to monitor - Check EKG - CTA is to be reattempted in AM, however, may consider to defer this given low probability.  3. Erosive gastritis/Duodenitis in  setting of NSAID use - noted on endoscopy 11/01/12. Has been compliant with PPI, with stabilization and improvement of abd pain symptoms. - Continue PPI, substitute with Protonix 40 mg qd   4. Normocytic anemia with iron deficiency - presumed secondary to #3. Admission Hgb 9.1 stable from prior 8.6 on recent discharge (although unclear if this is a hemoconcentrated specimen). Of note, during last admission, pt presented with Hgb of 5.4 and required transfusion of 2 units pRBCs. Of note, the pt refused colonoscopy during last admission, therefore, colonoscopic evaluation for contribution towards bleeding was not performed - although FOBT was negative (10/31/2012). Of note, she has been compliant of her iron supplementation. - Continued ferrous sulfate 325 mg tid. - Monitor for evidence of bleeding.  5. Constipation - pt is on iron replacement and is experiencing constipation. - Will add sennakot  6. Chronic decreased hearing - secondary to remote cancer treatment. Pt wears hearing aides and is unable to hear without these devices. - Request use of hearing aide to communicate.  7. History of hypertension - BP stable on admission. However, with mild orthostasis, will hold amlodipine initially. Of note, also recently dc'ed of Lisinopril during last admission, secondary to hypotension. - Hold amlodipine initially, reevaluate orthostasis, consider to resume soon if BP remain  elevated.    DVT PPX - low molecular weight heparin  CODE STATUS - Full  CONSULTS PLACED - None  DISPO - Disposition is deferred at this time, awaiting evaluation of .   Anticipated discharge in approximately 1-2 day(s).   The patient does not have a current PCP (is to be assigned to [facility] physician) and does need an [facility] hospital follow-up appointment after discharge.    Is the [facility] hospital follow-up appointment a one-time only appointment? no.  Does the patient have transportation limitations that hinder transportation to clinic appointments? no   Signed: Dow Adolph, MD PGY-1, Internal Medicine Resident Pager: [phone] (7AM-5PM) 11/07/2012, 10:37 PM

## 2012-11-07 NOTE — Assessment & Plan Note (Addendum)
Patient was noticed to desatted to 89% on room air while ambulating along with significant SOB. Will obtain a CT angiogram and admit patient for further evaluation and management.

## 2012-11-08 ENCOUNTER — Encounter (HOSPITAL_COMMUNITY): Payer: Self-pay | Admitting: Emergency Medicine

## 2012-11-08 ENCOUNTER — Observation Stay (HOSPITAL_COMMUNITY): Payer: Self-pay

## 2012-11-08 ENCOUNTER — Other Ambulatory Visit: Payer: Self-pay

## 2012-11-08 DIAGNOSIS — D509 Iron deficiency anemia, unspecified: Secondary | ICD-10-CM

## 2012-11-08 DIAGNOSIS — I951 Orthostatic hypotension: Principal | ICD-10-CM

## 2012-11-08 LAB — COMPREHENSIVE METABOLIC PANEL
ALT: 8 U/L (ref 0–35)
Alkaline Phosphatase: 71 U/L (ref 39–117)
BUN: 11 mg/dL (ref 6–23)
CO2: 20 mEq/L (ref 19–32)
GFR calc Af Amer: >90 mL/min (ref >90)
GFR calc non Af Amer: >90 mL/min (ref >90)
Glucose, Bld: 65 mg/dL — ABNORMAL LOW (ref 70–99)
Potassium: 3.6 mEq/L (ref 3.5–5.1)
Sodium: 139 mEq/L (ref 135–145)
Total Bilirubin: 0.1 mg/dL — ABNORMAL LOW (ref 0.3–1.2)

## 2012-11-08 LAB — CBC WITH DIFFERENTIAL/PLATELET

## 2012-11-08 LAB — DIFFERENTIAL
Basophils Absolute: 0.1 10*3/uL (ref 0.0–0.1)
Basophils Relative: 1 % (ref 0–1)
Eosinophils Relative: 14 % — ABNORMAL HIGH (ref 0–5)
Lymphs Abs: 2.7 10*3/uL (ref 0.7–4.0)
Monocytes Absolute: 0.8 10*3/uL (ref 0.1–1.0)
Neutro Abs: 6.8 10*3/uL (ref 1.7–7.7)
Neutrophils Relative %: 57 % (ref 43–77)

## 2012-11-08 LAB — CBC
MCHC: 29.8 g/dL — ABNORMAL LOW (ref 30.0–36.0)
Platelets: 518 10*3/uL — ABNORMAL HIGH (ref 150–400)
RDW: 24.3 % — ABNORMAL HIGH (ref 11.5–15.5)
WBC: 11.4 10*3/uL — ABNORMAL HIGH (ref 4.0–10.5)

## 2012-11-08 LAB — D-DIMER, QUANTITATIVE: D-Dimer, Quant: 0.44 ug/mL-FEU (ref 0.00–0.48)

## 2012-11-08 MED ORDER — LISINOPRIL 20 MG PO TABS: 20.0000 mg | ORAL_TABLET | Freq: Every day | ORAL | Status: DC

## 2012-11-08 NOTE — Progress Notes (Signed)
Pt signed d/c papers, IV removed, CMT notified of d/c, tele removed. Pt awaiting ride. Will continue to monitor.

## 2012-11-08 NOTE — Progress Notes (Signed)
Case discussed with Dr.Illath soon after the resident saw the patient.  We reviewed the resident's history and exam and pertinent patient test results.  I agree with the assessment, diagnosis and plan of care documented in the resident's note. 

## 2012-11-08 NOTE — Discharge Summary (Signed)
I saw Ms. Devine on the day of discharge and agree with the discharge plan.

## 2012-11-08 NOTE — Discharge Summary (Signed)
I saw Ms. Stacy Anderson on day of discharge, agree with above documentation.  She had a moderate risk modified geneva score for PE, but with normal D dimer and CXR her risk is significantly lower.  She had no symptoms of SOB, cough.  She was not hypoxic while in the hospital.

## 2012-11-08 NOTE — Progress Notes (Signed)
SpO2 100% at rest, 100% with standing, and 100% with ambulation on room air. Will continue to monitor.

## 2012-11-08 NOTE — Discharge Summary (Signed)
Patient Name:  Stacy Anderson MRN: [id number]  PCP: No Pcp Per Patient DOB:  1966/02/01       Date of Admission:  11/07/2012  Date of Discharge:  11/08/2012      Attending Physician: Inez Catalina, MD         DISCHARGE DIAGNOSES: 1. Orthostatic hypotension 2. Hypoxia with exertion 3. Erosive gastritis and duodenitis 4. Iron deficiency anemia 5. Hypertension 6. Secondary amenorrhea    DISPOSITION AND FOLLOW-UP: Stacy Anderson is to follow-up with the listed providers as detailed below, at which time, the following should be addressed:  1. Follow-up visits: 1. ANEMIA - Should be on iron 3 times a day. If hemoglobin fails to improve or worsens, patient needs colonoscopy. 2. HYPOXIA - Unknown etiology. Get PFTs. D-dimer and CXR normal here in hospital. 3. ORTHOSTATIC HYPOTENSION - Amlodipine stopped. Lisinopril started. 4. SECONDARY AMENORRHEA - Most likely pause. No further workup necessary. May diagnose menopause if she remains amenorrheic through November. 5. PRIMARY CARE: Patient will be establishing with [place]. She will need to meet with Rudell Cobb for financial assistance/counseling. 6. GASTRITIS: Patient should still be on omeprazole 40 mg daily. 7. HEART MURMUR: Ejection murmur on exam. Known septal hypertrophy from echo 05/26/2010. Repeat echocardiogram.  2. Labs and images needed: 1. PFTs 2. CBC 3. 2-D echocardiogram with contrast  3. Pending labs and tests needing follow-up: 1. NONE   Follow-up Information   Follow up with Janalyn Harder, MD On 11/19/2012. (INTERNAL MEDICINE - Your appointment is on Tuesday, June 3rd at 2:45. )    Contact information:   [address] [place] [address] [phone]      Follow up with Jomarie Longs, RN On 11/12/2012. (FINANCIAL COUNSELING - Your appointment with Rudell Cobb is on Tuesday, May 27th at 2:00.)    Contact information:   [address] [place] [address] [phone]      Future  Appointments Provider Department Dept Phone   11/12/2012 2:00 PM [facility] Financial Counselor [facility] [phone]   11/19/2012 2:45 PM Linward Headland, MD [facility] [phone]       DISCHARGE MEDICATIONS:   Medication List    STOP taking these medications       amLODipine 5 MG tablet  Commonly known as:  NORVASC      TAKE these medications       BIOTENE MOISTURIZING MOUTH Soln  Use as directed 1-2 sprays in the mouth or throat daily as needed (dryness).     ferrous sulfate 325 (65 FE) MG tablet  Take 1 tablet (325 mg total) by mouth 3 (three) times daily with meals.     lisinopril 20 MG tablet  Commonly known as:  ZESTRIL  Take 1 tablet (20 mg total) by mouth daily.     omeprazole 20 MG capsule  Commonly known as:  PRILOSEC  Take 2 capsules (40 mg total) by mouth daily.        CONSULTS:  NONE   PROCEDURES PERFORMED:  Dg Chest 2 View 11/08/2012   FINDINGS: The heart size appears normal.  There is no pleural effusion or edema.  There is no airspace consolidation identified. Review of the visualized osseous structures is unremarkable.   IMPRESSION:  1.  No acute cardiopulmonary abnormalities.    ADMISSION DATA: H&P: Patient is a 47 y.o. 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 The [facility] the day of admission for hospital followup and although her abdominal pain symptoms had stabilized, the patient did complain of new episodes of positional lightheadedness occuring since hospital discharge. Her orthostatic vitals were checked and found to be positive with SBP changing from 145 to 122 lying to standing, the cause of which was unclear.  Additionally, the patient was ambulated with notable desaturation to 89% at room air and significant shortness of breath was experienced. The pt attributes her symptoms towards the rapid pace with which she was ambulated.  Otherwise, the pt indicates that she has been feeling well since discharge. Specifically, she has been eating 3 meals daily (which is actually on improvement for her), drinking a lot of fluids. Additionally, she has not experienced decreased oral intake, shortness of breath, palpitations, chest pain, hemoptysis, nausea/vomiting, blood in stool or urine, vertigo. She has been taking her Omeprazole regularly without missing doses, which she finds to be helpful. She discontinued the lisinopril that was recommended for DC during the last admission.  Physical Exam: Vital Signs: Blood pressure 135/94, pulse 89, temperature 97.7 F (36.5 C), temperature source Oral, resp. rate 18, height 5\' 6"  (1.676 m), weight 166 lb 10.7 oz (75.6 kg), last menstrual period 05/17/2012, SpO2 100.00%.  Physical Exam:  General:  Vital signs reviewed and noted. Well-developed, well-nourished, in no acute distress; alert, appropriate and cooperative throughout examination.   Head:  Normocephalic, atraumatic.   Eyes:  PERRL, EOMI, No signs of anemia or jaundince.   Nose:  Mucous membranes moist, not inflammed, nonerythematous.   Throat:  Oropharynx nonerythematous, no exudate appreciated.   Neck:  No deformities, masses, or tenderness noted.Supple, No carotid Bruits, no JVD.   Lungs:  Normal respiratory effort. Clear to auscultation BL without crackles or wheezes.   Heart:  RRR. S1 and S2 normal without gallop, murmur, or rubs.   Abdomen:  BS normoactive. Soft, Nondistended, non-tender. No masses or organomegaly.   Extremities:  No pretibial edema.   Neurologic:  A&O X3, CN II - XII are grossly intact. Motor strength is 5/5 in the all 4 extremities, Sensations intact to light touch, Cerebellar signs negative.    Skin:  No visible rashes, scars.    Labs: CBC:    Component  Value  Date/Time    WBC  13.2*  11/07/2012 1520    HGB  9.1*  11/07/2012 1520    HCT  30.2*  11/07/2012 1520    PLT  572*  11/07/2012 1520    MCV  73.7*  11/07/2012 1520    NEUTROABS  8.1*  11/07/2012 1520    LYMPHSABS  3.1  11/07/2012 1520    MONOABS  0.6  11/07/2012 1520    EOSABS  1.3*  11/07/2012 1520    BASOSABS  0.0  11/07/2012 1520    Metabolic Panel:    Component  Value  Date/Time    NA  137  11/03/2012 0612    K  3.8  11/03/2012 0612    CL  105  11/03/2012 0612    CO2  18*  11/03/2012 0612    BUN  13  11/03/2012 0612    CREATININE  0.76  11/03/2012 0612    GLUCOSE  77  11/03/2012 0612    CALCIUM  9.5  11/03/2012 0612    AST  18  11/02/2012 0640    ALT  7  11/02/2012 0640    ALKPHOS  71  11/02/2012 0640    BILITOT  0.2*  11/02/2012 0640    PROT  7.1  11/02/2012 0640    ALBUMIN  3.3*  11/02/2012 0640     HOSPITAL COURSE: 1.   Orthostatic hypotension:  Found to be orthostatic in clinic at her hospital followup appointment. She received IV hydration overnight at 100 mL per hour. Orthostatic recheck in the morning (on the day of discharge) she was no longer orthostatic. Both in the clinic and while in the hospital, she denied dizziness and orthostatic symptoms. We stopped amlodipine, since this has been known to cause orthostatic hypotension in rare cases; we have restarted lisinopril at 20 mg daily.  2.    Hypoxia with exertion:  Patient desaturated while walking in the clinic to 89%. There was concern for PE; but with intermediate probability by Well's score, a negative d-dimer, and a normal chest x-ray - this was ruled out. She ambulated without desaturating on the morning of discharge. Consider pulmonary function testing as an outpatient.  3.   Iron deficiency anemia:  Patient previously presented with a hemoglobin of 5.4 and no obvious source of bleeding. Other cell lines normal. She was severely iron deficient with an iron of  10, ferritin of 2, and percent saturation of 2. No evidence of hemolysis. Folate and B12 normal. She received 2 units of packed red blood cells and was started on hypotonic drip. Gastroenterology was consulted and esophagogastroduodenoscopy revealed gastritis and duodenitis. Gastroenterology was comfortable assuming the source of blood loss to be upper GI. Hemoglobin remained stable in the range of 7.5-8. During this hospitalization, hemoglobin was stable at 8.8-9.1. Continue ferrous sulfate 325 mg 3 times a day. If anemia fails to resolve or worsens, patient will need a colonoscopy.  4. Erosive gastritis and duodenitis:  Observed on esophagogastroduodenoscopy during last admission. Continue omeprazole 40 mg daily.  5.   Hypertension:  She was last discharged on amlodipine 5 mg daily. There are are case reports of amlodipine causing orthostatic hypotension, so we have stopped this. We discharged her on lisinopril 20 mg daily.  6. Secondary amenorrhea:  Last menstrual period was November 2013. Most likely menopause. TSH normal. Urine pregnancy test negative last hospitalization. No further work-up needed. At 12 months, if no menstrual period, menopause can be diagnosed.   DISCHARGE DATA: Vital Signs: BP 150/88  Pulse 101  Temp(Src) 98.4 F (36.9 C) (Oral)  Resp 16  Ht 5\' 6"  (1.676 m)  Wt 166 lb 10.7 oz (75.6 kg)  BMI 26.91 kg/m2  SpO2 100%  LMP 05/17/2012   Physical Exam:  GENERAL: well developed, well nourished; no acute distress LUNGS: clear to auscultation bilaterally, normal work of breathing HEART: normal rate and regular rhythm; normal S1 and S2 without S3 or S4; 2/6, crescendo decrescendo, systolic murmur heard best at the upper sternal borders  Labs: Results for orders placed during the hospital encounter of 11/07/12 (from the past 24 hour(s))  COMPREHENSIVE METABOLIC PANEL     Status: Abnormal   Collection Time    11/08/12  5:15 AM      Result Value Range   Sodium 139  135 -  145 mEq/L   Potassium 3.6  3.5 - 5.1 mEq/L   Chloride 106  96 - 112 mEq/L   CO2 20  19 - 32 mEq/L  Glucose, Bld 65 (*) 70 - 99 mg/dL   BUN 11  6 - 23 mg/dL   Creatinine, Ser 1.61  0.50 - 1.10 mg/dL   Calcium 9.4  8.4 - 09.6 mg/dL   Total Protein 7.1  6.0 - 8.3 g/dL   Albumin 3.3 (*) 3.5 - 5.2 g/dL   AST 15  0 - 37 U/L   ALT 8  0 - 35 U/L   Alkaline Phosphatase 71  39 - 117 U/L   Total Bilirubin 0.1 (*) 0.3 - 1.2 mg/dL   GFR calc non Af Amer >90  >90 mL/min   GFR calc Af Amer >90  >90 mL/min  CBC     Status: Abnormal   Collection Time    11/08/12  5:15 AM      Result Value Range   WBC 11.4 (*) 4.0 - 10.5 K/uL   RBC 3.95  3.87 - 5.11 MIL/uL   Hemoglobin 8.8 (*) 12.0 - 15.0 g/dL   HCT 04.5 (*) 40.9 - 81.1 %   MCV 74.7 (*) 78.0 - 100.0 fL   MCH 22.3 (*) 26.0 - 34.0 pg   MCHC 29.8 (*) 30.0 - 36.0 g/dL   RDW 91.4 (*) 78.2 - 95.6 %   Platelets 518 (*) 150 - 400 K/uL  DIFFERENTIAL     Status: Abnormal   Collection Time    11/08/12  5:15 AM      Result Value Range   Neutrophils Relative % 57  43 - 77 %   Neutro Abs 6.8  1.7 - 7.7 K/uL   Lymphocytes Relative 22  12 - 46 %   Lymphs Abs 2.7  0.7 - 4.0 K/uL   Monocytes Relative 6  3 - 12 %   Monocytes Absolute 0.8  0.1 - 1.0 K/uL   Eosinophils Relative 14 (*) 0 - 5 %   Eosinophils Absolute 1.6 (*) 0.0 - 0.7 K/uL   Basophils Relative 1  0 - 1 %   Basophils Absolute 0.1  0.0 - 0.1 K/uL  D-DIMER, QUANTITATIVE     Status: None   Collection Time    11/08/12  8:04 AM      Result Value Range   D-Dimer, Quant 0.44  0.00 - 0.48 ug/mL-FEU  CBC WITH DIFFERENTIAL     Status: None   Collection Time    11/08/12 10:00 AM      Result Value Range   WBC TEST CANCELLED PER RN     RBC TEST CANCELLED PER RN     Hemoglobin TEST CANCELLED PER RN     HCT TEST CANCELLED PER RN     MCV TEST CANCELLED PER RN     MCH TEST CANCELLED PER RN     MCHC TEST CANCELLED PER RN     RDW TEST CANCELLED PER RN     Platelets TEST CANCELLED PER RN      Neutrophils Relative % PENDING     Neutro Abs PENDING     Band Neutrophils PENDING     Lymphocytes Relative PENDING     Lymphs Abs PENDING     Monocytes Relative PENDING     Monocytes Absolute PENDING     Eosinophils Relative PENDING     Eosinophils Absolute PENDING     Basophils Relative PENDING     Basophils Absolute PENDING     LUCs, % PENDING     LUC, Absolute PENDING     WBC Morphology PENDING     RBC  Morphology PENDING     Smear Review PENDING     Other PENDING     Other 2 PENDING     nRBC PENDING     Metamyelocytes Relative PENDING     Myelocytes PENDING     Promyelocytes Absolute PENDING     Blasts PENDING       Time spent on discharge: 33 minutes   Services Ordered on Discharge: 1. PT - no 2. OT - no 3. RN - no 4. Other - no   Signed by:  Dorthula Rue. Earlene Plater, MD PGY-I, Internal Medicine  11/08/2012, 12:43 PM

## 2012-11-08 NOTE — H&P (Signed)
Internal Medicine Teaching Service Attending Note Date: 11/08/2012  Patient name: Stacy Anderson  Medical record number: [id number]  Date of birth: 31-Mar-1966   CC: Orthostasis  I have seen and evaluated Stacy Anderson and discussed their care with the Residency Team.    Stacy Anderson is a 47yo woman with PMH of nasal tumor many years ago, hearing loss 2/2 treatment of cancer, depression.  She has a history of severe anemia which was diagnosed as due to erosive gastritis and duodenitis.  She was recently admitted for EGD.  She presented to the clinic on day of admission for hospital follow up.  She reports, now, that she did have some lightheadedness in the clinic, but she believes it was due to the long walk from the parking deck (recently moved to the [employer]).  She reports no dizziness at home since leaving the hospital.  Orthostatic vital signs checked in the clinic were + with SBP going from 145 to 122.  She was also ambulated in the hallway with a desaturation to 89%.  She currently denies chest pain, SOB, dizziness, lightheadedness with standing, illness since being in the hospital, decreased oral intake, swelling of her extremities, recent immobilization, palpitations, N/V, change in bowel/bladder habits, hemoptysis.  She notes that she was feeling well except for the walk from the parking deck making her tired.   She has since been walked in the hallway with nursing and her saturation stayed at 100%.   She does take amlodipine which can cause orthostatic hypotension  Physical Exam: Blood pressure 150/88, pulse 101, temperature 98.4 F (36.9 C), temperature source Oral, resp. rate 16, height 5\' 6"  (1.676 m), weight 166 lb 10.7 oz (75.6 kg), last menstrual period 05/17/2012, SpO2 100.00%. General appearance: alert, cooperative, appears stated age and no distress Head: Normocephalic, without obvious abnormality, atraumatic Eyes: anicteric sclerae, clear conjunctivae Ears: + hearing aids  bilaterally Lungs: clear to auscultation bilaterally and no wheezing Heart: mild tachycardia, RR, + murmur best heard at LUSB Abdomen: + BS, soft Extremities: no edema Pulses: 2+ and symmetric  Lab results: Results for orders placed during the hospital encounter of 11/07/12 (from the past 24 hour(s))  COMPREHENSIVE METABOLIC PANEL     Status: Abnormal   Collection Time    11/08/12  5:15 AM      Result Value Range   Sodium 139  135 - 145 mEq/L   Potassium 3.6  3.5 - 5.1 mEq/L   Chloride 106  96 - 112 mEq/L   CO2 20  19 - 32 mEq/L   Glucose, Bld 65 (*) 70 - 99 mg/dL   BUN 11  6 - 23 mg/dL   Creatinine, Ser 4.09  0.50 - 1.10 mg/dL   Calcium 9.4  8.4 - 81.1 mg/dL   Total Protein 7.1  6.0 - 8.3 g/dL   Albumin 3.3 (*) 3.5 - 5.2 g/dL   AST 15  0 - 37 U/L   ALT 8  0 - 35 U/L   Alkaline Phosphatase 71  39 - 117 U/L   Total Bilirubin 0.1 (*) 0.3 - 1.2 mg/dL   GFR calc non Af Amer >90  >90 mL/min   GFR calc Af Amer >90  >90 mL/min  CBC     Status: Abnormal   Collection Time    11/08/12  5:15 AM      Result Value Range   WBC 11.4 (*) 4.0 - 10.5 K/uL   RBC 3.95  3.87 - 5.11 MIL/uL  Hemoglobin 8.8 (*) 12.0 - 15.0 g/dL   HCT 16.1 (*) 09.6 - 04.5 %   MCV 74.7 (*) 78.0 - 100.0 fL   MCH 22.3 (*) 26.0 - 34.0 pg   MCHC 29.8 (*) 30.0 - 36.0 g/dL   RDW 40.9 (*) 81.1 - 91.4 %   Platelets 518 (*) 150 - 400 K/uL  DIFFERENTIAL     Status: Abnormal   Collection Time    11/08/12  5:15 AM      Result Value Range   Neutrophils Relative % 57  43 - 77 %   Neutro Abs 6.8  1.7 - 7.7 K/uL   Lymphocytes Relative 22  12 - 46 %   Lymphs Abs 2.7  0.7 - 4.0 K/uL   Monocytes Relative 6  3 - 12 %   Monocytes Absolute 0.8  0.1 - 1.0 K/uL   Eosinophils Relative 14 (*) 0 - 5 %   Eosinophils Absolute 1.6 (*) 0.0 - 0.7 K/uL   Basophils Relative 1  0 - 1 %   Basophils Absolute 0.1  0.0 - 0.1 K/uL  D-DIMER, QUANTITATIVE     Status: None   Collection Time    11/08/12  8:04 AM      Result Value Range    D-Dimer, Quant 0.44  0.00 - 0.48 ug/mL-FEU  CBC WITH DIFFERENTIAL     Status: None   Collection Time    11/08/12 10:00 AM      Result Value Range   WBC TEST CANCELLED PER RN     RBC TEST CANCELLED PER RN     Hemoglobin TEST CANCELLED PER RN     HCT TEST CANCELLED PER RN     MCV TEST CANCELLED PER RN     MCH TEST CANCELLED PER RN     MCHC TEST CANCELLED PER RN     RDW TEST CANCELLED PER RN     Platelets TEST CANCELLED PER RN     Neutrophils Relative % PENDING     Neutro Abs PENDING     Band Neutrophils PENDING     Lymphocytes Relative PENDING     Lymphs Abs PENDING     Monocytes Relative PENDING     Monocytes Absolute PENDING     Eosinophils Relative PENDING     Eosinophils Absolute PENDING     Basophils Relative PENDING     Basophils Absolute PENDING     LUCs, % PENDING     LUC, Absolute PENDING     WBC Morphology PENDING     RBC Morphology PENDING     Smear Review PENDING     Other PENDING     Other 2 PENDING     nRBC PENDING     Metamyelocytes Relative PENDING     Myelocytes PENDING     Promyelocytes Absolute PENDING     Blasts PENDING      Imaging results:  Dg Chest 2 View  11/08/2012   *RADIOLOGY REPORT*  Clinical Data: Hypoxia.  CHEST - 2 VIEW  Comparison: 06/04/2007  Findings: The heart size appears normal.  There is no pleural effusion or edema.  There is no airspace consolidation identified. Review of the visualized osseous structures is unremarkable.  IMPRESSION:  1.  No acute cardiopulmonary abnormalities.   Original Report Authenticated By: Signa Kell, M.D.    Assessment and Plan: I agree with the formulated Assessment and Plan with the following changes:   1. Orthostatic hypotension - Resolved, recheck normal.  H/H  are stable.  She is on amlodipine which rarely can cause this issue.  Will d/c in favor of lisinopril (which she as on before).   2. Hypoxia - Recheck with ambulation normal.  CXR normal as above, Ddimer normal.  She has not had cough, SOB,  chest pain, leg swelling, recent immobilization.  - Well's score 1.5, low risk, Her modified geneva is 5, which is moderate risk.  However, the negative ddimer I believe places her in a lower risk category - EKG was read as sinus rhythm, no new RBBB, no classic findings for right heart strain or PE.   Other issues per resident note.  As concerns her anemia and erosive esophagitis, she is taking her PPI and has had improved reflux symptoms.  Her H/H are stable.  Will follow up in our clinic in the future.   Inez Catalina, MD 5/23/201411:54 AM

## 2012-11-12 ENCOUNTER — Ambulatory Visit: Payer: Self-pay

## 2012-11-13 ENCOUNTER — Other Ambulatory Visit: Payer: Self-pay | Admitting: *Deleted

## 2012-11-13 MED ORDER — OMEPRAZOLE 20 MG PO CPDR: 40.0000 mg | DELAYED_RELEASE_CAPSULE | Freq: Every day | ORAL | Status: DC

## 2012-11-14 NOTE — Telephone Encounter (Signed)
Rx for omeprazole called into pharmacy.  I called in # [phone] as pt takes 2 a day.  Approved by Dr Eben Burow

## 2012-11-19 ENCOUNTER — Encounter: Payer: Self-pay | Admitting: *Deleted

## 2012-11-19 ENCOUNTER — Ambulatory Visit (INDEPENDENT_AMBULATORY_CARE_PROVIDER_SITE_OTHER): Payer: PRIVATE HEALTH INSURANCE | Admitting: Internal Medicine

## 2012-11-19 ENCOUNTER — Encounter: Payer: Self-pay | Admitting: Internal Medicine

## 2012-11-19 VITALS — BP 100/73 | HR 91 | Temp 97.8°F | Resp 20 | Ht 66.5 in | Wt 179.7 lb

## 2012-11-19 DIAGNOSIS — I951 Orthostatic hypotension: Secondary | ICD-10-CM

## 2012-11-19 DIAGNOSIS — D62 Acute posthemorrhagic anemia: Secondary | ICD-10-CM

## 2012-11-19 DIAGNOSIS — I519 Heart disease, unspecified: Secondary | ICD-10-CM

## 2012-11-19 MED ORDER — OMEPRAZOLE 20 MG PO CPDR: 40.0000 mg | DELAYED_RELEASE_CAPSULE | Freq: Every day | ORAL | Status: DC

## 2012-11-19 NOTE — Assessment & Plan Note (Signed)
The patient has a history of diastolic dysfunction, with septal hypertrophy without obstruction in 2011.  During her last hospitalization, there was some concern that her heart murmur had worsened, and that some of the patient's dyspnea and hypotension at the time could be due to worsening heart failure. -will order outpatient echo for further evaluation

## 2012-11-19 NOTE — Patient Instructions (Signed)
General Instructions: Your fatigue is likely still due to your low hemoglobin levels.  We are rechecking your hemoglobin today.  Continue to take your iron pills three times per day.  We would also like to check an echocardiogram, which is an ultrasound of your heart.  We will contact you with this appointment.  Please return for a follow-up visit in 3 months.   Treatment Goals:  Goals (1 Years of Data) as of 11/19/12   None      Progress Toward Treatment Goals:  Treatment Goal 11/19/2012  Blood pressure at goal    Self Care Goals & Plans:  Self Care Goal 11/19/2012  Manage my medications take my medicines as prescribed; refill my medications on time; bring my medications to every visit  Eat healthy foods eat foods that are low in salt; eat baked foods instead of fried foods; drink diet soda or water instead of juice or soda  Be physically active find an activity I enjoy  Other enjoys exercise bike for 30 min when has energy       Care Management & Community Referrals:  Referral 11/19/2012  Referrals made for care management support none needed

## 2012-11-19 NOTE — Assessment & Plan Note (Signed)
The patient was recently hospitalized last month with anemia, thought to be due to erosive gastritis.  Since hospital discharge, she notes no blood in stool (dark color likely due to iron tablets).  She notes that her fatigue is slowly improving. -recheck CBC today, to ensure Hb increasing -continue iron supplementation -continue omeprazole

## 2012-11-19 NOTE — Assessment & Plan Note (Signed)
Orthostatic vitals were checked today, and found to be normal (no orthostatic hypotension.

## 2012-11-19 NOTE — Progress Notes (Signed)
HPI The patient is a 47 y.o. female with a history of HTN, diastolic dysfunction, iron deficiency anemia, presenting for a hospital follow-up.  The patient was hospitalized earlier this month, then again 5/22-5/23, found to have significant iron deficiency anemia, thought to be due to erosive gastritis.  Since hospital discharge, the patient continues to note fatigue, though she notes that this is improving.  She has not noticed any BRBPR, though she has noted darker stools since starting iron supplementation.  She notes no constipation.  The patient has a history of diastolic dysfunction, with echo in 2011 showing septal hypertrophy without obstruction.  ROS: General: no fevers, chills, changes in weight, changes in appetite Skin: no rash HEENT: no blurry vision, hearing changes, sore throat Pulm: no dyspnea, coughing, wheezing CV: no chest pain, palpitations, shortness of breath Abd: no abdominal pain, nausea/vomiting, diarrhea/constipation GU: no dysuria, hematuria, polyuria Ext: no arthralgias, myalgias Neuro: no weakness, numbness, or tingling  Filed Vitals:   11/19/12 1510  BP: 100/73  Pulse: 91  Temp:   Resp:    PEX General: alert, cooperative, and in no apparent distress HEENT: pupils equal round and reactive to light, vision grossly intact, oropharynx clear and non-erythematous  Neck: supple, no lymphadenopathy Lungs: clear to ascultation bilaterally, normal work of respiration, no wheezes, rales, ronchi Heart: regular rate and rhythm, no murmurs, gallops, or rubs Abdomen: soft, non-tender, non-distended, normal bowel sounds Extremities: no cyanosis, clubbing, or edema Neurologic: alert & oriented X3, cranial nerves II-XII intact, strength grossly intact, sensation intact to light touch  Current Outpatient Prescriptions on File Prior to Visit  Medication Sig Dispense Refill  . Artificial Saliva (BIOTENE MOISTURIZING MOUTH) SOLN Use as directed 1-2 sprays in the mouth or  throat daily as needed (dryness).      . ferrous sulfate 325 (65 FE) MG tablet Take 1 tablet (325 mg total) by mouth 3 (three) times daily with meals.  90 tablet  0  . lisinopril (ZESTRIL) 20 MG tablet Take 1 tablet (20 mg total) by mouth daily.  30 tablet  0  . omeprazole (PRILOSEC) 20 MG capsule Take 2 capsules (40 mg total) by mouth daily.  30 capsule  0   No current facility-administered medications on file prior to visit.    Assessment/Plan

## 2012-11-20 LAB — CBC
MCH: 24.5 pg — ABNORMAL LOW (ref 26.0–34.0)
MCHC: 30.8 g/dL (ref 30.0–36.0)
Platelets: 495 10*3/uL — ABNORMAL HIGH (ref 150–400)
RBC: 3.96 MIL/uL (ref 3.87–5.11)

## 2012-11-20 NOTE — Progress Notes (Signed)
INTERNAL MEDICINE TEACHING ATTENDING ADDENDUM: I discussed this case with Dr. Brown soon after the patient visit. I have read the documentation and I agree with the plan of care. Please see the resident note above for details of management.  

## 2012-11-22 ENCOUNTER — Ambulatory Visit (HOSPITAL_COMMUNITY): Payer: PRIVATE HEALTH INSURANCE | Attending: Internal Medicine

## 2013-01-15 ENCOUNTER — Other Ambulatory Visit: Payer: Self-pay | Admitting: *Deleted

## 2013-01-15 MED ORDER — LISINOPRIL 20 MG PO TABS: 20.0000 mg | ORAL_TABLET | Freq: Every day | ORAL | Status: DC

## 2013-01-15 NOTE — Telephone Encounter (Signed)
[facility] inform of Lisinopril refill.

## 2013-02-03 ENCOUNTER — Encounter: Payer: Self-pay | Admitting: Internal Medicine

## 2013-02-03 ENCOUNTER — Ambulatory Visit (INDEPENDENT_AMBULATORY_CARE_PROVIDER_SITE_OTHER): Payer: PRIVATE HEALTH INSURANCE | Admitting: Internal Medicine

## 2013-02-03 VITALS — BP 121/88 | HR 78 | Temp 97.6°F | Ht 66.0 in | Wt 192.7 lb

## 2013-02-03 DIAGNOSIS — D509 Iron deficiency anemia, unspecified: Secondary | ICD-10-CM

## 2013-02-03 DIAGNOSIS — M25559 Pain in unspecified hip: Secondary | ICD-10-CM

## 2013-02-03 DIAGNOSIS — I519 Heart disease, unspecified: Secondary | ICD-10-CM

## 2013-02-03 DIAGNOSIS — G8929 Other chronic pain: Secondary | ICD-10-CM

## 2013-02-03 DIAGNOSIS — I10 Essential (primary) hypertension: Secondary | ICD-10-CM

## 2013-02-03 LAB — CBC
HCT: 38.2 % (ref 36.0–46.0)
Hemoglobin: 12.4 g/dL (ref 12.0–15.0)
MCH: 31.7 pg (ref 26.0–34.0)
MCHC: 32.5 g/dL (ref 30.0–36.0)
MCV: 97.7 fL (ref 78.0–100.0)
Platelets: 408 10*3/uL — ABNORMAL HIGH (ref 150–400)
RDW: 18.7 % — ABNORMAL HIGH (ref 11.5–15.5)

## 2013-02-03 MED ORDER — ACETAMINOPHEN 325 MG PO TABS: 650.0000 mg | ORAL_TABLET | Freq: Four times a day (QID) | ORAL | Status: DC | PRN

## 2013-02-03 MED ORDER — FERROUS SULFATE 325 (65 FE) MG PO TABS: 325.0000 mg | ORAL_TABLET | Freq: Three times a day (TID) | ORAL | Status: DC
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
## 2013-02-03 NOTE — Assessment & Plan Note (Addendum)
Restarted iron supplementation and advised the patient to be consistent with this medication until we are sure her iron levels are normal.  will order CBC and iron anemia panel

## 2013-02-03 NOTE — Assessment & Plan Note (Signed)
The left hip pain has been more recently which she attributes to her increased activity. It responds well to Tylenol.  Plan  - continue with Tylenol as needed for pain  - Will avoid NSAIDs 2/2 hx of gastritis with iron def anemia.

## 2013-02-03 NOTE — Assessment & Plan Note (Signed)
No cardiopulmonary symptoms at this time. Reminded patient to follow up with her echo.

## 2013-02-03 NOTE — Patient Instructions (Signed)
General Instructions: 1. Please take your iron supplements as prescribed  2. We will check your iron level and I will give you a call if there is a concern 3. Please follow up in three months   Treatment Goals:  Goals (1 Years of Data) as of 02/03/13   None      Progress Toward Treatment Goals:  Treatment Goal 02/03/2013  Blood pressure at goal    Self Care Goals & Plans:  Self Care Goal 02/03/2013  Manage my medications take my medicines as prescribed; bring my medications to every visit; refill my medications on time; follow the sick day instructions if I am sick  Eat healthy foods eat more vegetables; eat fruit for snacks and desserts; eat baked foods instead of fried foods; eat foods that are low in salt; eat smaller portions  Be physically active take a walk every day; find an activity I enjoy  Other -       Care Management & Community Referrals:  Referral 11/19/2012  Referrals made for care management support none needed

## 2013-02-03 NOTE — Assessment & Plan Note (Signed)
BP Readings from Last 3 Encounters:  02/03/13 121/88  11/19/12 100/73  11/08/12 150/88    Lab Results  Component Value Date   NA 139 11/08/2012   K 3.6 11/08/2012   CREATININE 0.72 11/08/2012    Assessment: Blood pressure control: controlled Progress toward BP goal:  at goal Comments:None  Plan: Medications:  continue current medications Educational resources provided: brochure;handout;video Self management tools provided:   Other plans: none

## 2013-02-03 NOTE — Progress Notes (Signed)
Pt aware of 2D echo at [facility] 02/05/13 3PM - no restrictions. Stanton Kidney Pa Tennant RN 02/03/13 3:30PM

## 2013-02-04 LAB — IRON AND TIBC
%SAT: 14 % — ABNORMAL LOW (ref 20–55)
Iron: 46 ug/dL (ref 42–145)
UIBC: 288 ug/dL (ref 125–400)

## 2013-02-04 NOTE — Progress Notes (Signed)
Case discussed with Dr. Zada Girt soon after the resident saw the patient.  We reviewed the resident's history and exam and pertinent patient test results.  I agree with the assessment, diagnosis, and plan of care documented in the resident's note. The pt is anemic and has increased plts which can be reactive 2/2 to his anemia. But also chronically increased WBC, most recently with increased eos. UTD states increased eos could be 2/2 leukemia, solid tumors, infection with helminthic parasites, allergic reactions. Would like to normalize her HgB and if WBC still increased, then refer to Heme.

## 2013-02-05 ENCOUNTER — Ambulatory Visit: Payer: PRIVATE HEALTH INSURANCE | Admitting: Internal Medicine

## 2013-02-05 ENCOUNTER — Ambulatory Visit (HOSPITAL_COMMUNITY): Payer: PRIVATE HEALTH INSURANCE | Attending: Internal Medicine

## 2013-04-04 ENCOUNTER — Encounter (HOSPITAL_COMMUNITY): Payer: Self-pay | Admitting: Emergency Medicine

## 2013-04-04 ENCOUNTER — Emergency Department (HOSPITAL_COMMUNITY)
Admission: EM | Admit: 2013-04-04 | Discharge: 2013-04-04 | Disposition: A | Discharge status: Home / Self Care | Payer: BC Managed Care – PPO | Attending: Emergency Medicine | Admitting: Emergency Medicine

## 2013-04-04 ENCOUNTER — Emergency Department (HOSPITAL_COMMUNITY): Payer: BC Managed Care – PPO

## 2013-04-04 ENCOUNTER — Encounter: Payer: Self-pay | Admitting: Internal Medicine

## 2013-04-04 DIAGNOSIS — Z8619 Personal history of other infectious and parasitic diseases: Secondary | ICD-10-CM | POA: Insufficient documentation

## 2013-04-04 DIAGNOSIS — M1990 Unspecified osteoarthritis, unspecified site: Secondary | ICD-10-CM | POA: Insufficient documentation

## 2013-04-04 DIAGNOSIS — D72829 Elevated white blood cell count, unspecified: Secondary | ICD-10-CM

## 2013-04-04 DIAGNOSIS — Z87891 Personal history of nicotine dependence: Secondary | ICD-10-CM | POA: Insufficient documentation

## 2013-04-04 DIAGNOSIS — Z8669 Personal history of other diseases of the nervous system and sense organs: Secondary | ICD-10-CM | POA: Insufficient documentation

## 2013-04-04 DIAGNOSIS — M25552 Pain in left hip: Secondary | ICD-10-CM

## 2013-04-04 DIAGNOSIS — Z8522 Personal history of malignant neoplasm of nasal cavities, middle ear, and accessory sinuses: Secondary | ICD-10-CM | POA: Insufficient documentation

## 2013-04-04 DIAGNOSIS — I10 Essential (primary) hypertension: Secondary | ICD-10-CM | POA: Insufficient documentation

## 2013-04-04 DIAGNOSIS — Z79899 Other long term (current) drug therapy: Secondary | ICD-10-CM | POA: Insufficient documentation

## 2013-04-04 DIAGNOSIS — M549 Dorsalgia, unspecified: Secondary | ICD-10-CM | POA: Insufficient documentation

## 2013-04-04 DIAGNOSIS — Z87828 Personal history of other (healed) physical injury and trauma: Secondary | ICD-10-CM | POA: Insufficient documentation

## 2013-04-04 DIAGNOSIS — M25559 Pain in unspecified hip: Secondary | ICD-10-CM | POA: Insufficient documentation

## 2013-04-04 MED ORDER — OXYCODONE-ACETAMINOPHEN 5-325 MG PO TABS
2.0000 | ORAL_TABLET | Freq: Once | ORAL | Status: AC
  Administered 2013-04-04: 2 via ORAL
  Filled 2013-04-04: qty 2

## 2013-04-04 MED ORDER — TRAMADOL HCL 50 MG PO TABS: 50.0000 mg | ORAL_TABLET | Freq: Four times a day (QID) | ORAL | Status: DC | PRN

## 2013-04-04 NOTE — ED Notes (Signed)
Erin, PA informed of pt's BP- pt has not taken medication yet today and will take it when she gets home.

## 2013-04-04 NOTE — ED Notes (Signed)
Irrigated patient R ear with saline.   No foreign objects removed by flushing.

## 2013-04-04 NOTE — ED Provider Notes (Signed)
Medical screening examination/treatment/procedure(s) were performed by non-physician practitioner and as supervising physician I was immediately available for consultation/collaboration.   Glynn Octave, MD 04/04/13 [phone]

## 2013-04-04 NOTE — ED Notes (Addendum)
Patient states has not had any of her bp medications today.  She advised will take when she gets home.  Charna Busman, RN to please give PA information before allowing to leave.

## 2013-04-04 NOTE — ED Notes (Signed)
Pt reports she broke her left hip about 15 years ago. States that she had screws placed in that hip, and recently she has been having pain on that side. States it was very painful this morning to walk on. Sensation intact. No recent injury to the area.

## 2013-04-04 NOTE — ED Provider Notes (Signed)
CSN: [id number]     Arrival date & time 04/04/13  0845 History  This chart was scribed for non-physician practitioner Junius Finner, PA-C working with Glynn Octave, MD by Leone Payor, ED Scribe. This patient was seen in room TR09C/TR09C and the patient's care was started at 0845.    Chief Complaint  Patient presents with  . Hip Pain    The history is provided by the patient. No language interpreter was used.    HPI Comments: Stacy Anderson is a 47 y.o. female who presents to the Emergency Department complaining of constant, gradually worsening left hip pain that began 2 weeks ago. She describes this pain as throbbing and aching and rates it as 8/10. She reports having associated back pain that radiates to the left groin and down the anterior left upper leg. Pt states she broke her left hip about 15 years ago and had screws placed. She reports having worsened pain with standing and walking. She denies any recent falls or heavy lifting. She denies numbness or weakness in legs. Denies change in bowel or bladder habits.   Past Medical History  Diagnosis Date  . Hypertension   . Sensorineural hearing loss of both ears     likely secondary to the radiation therapy, chemotherapy and the vascular flow issues  . Left carotid artery stenosis   . Herpes genitalis 12/30/2008  . Malignant tumor of maxillary sinus 1989    treated with chemotherapy and radiation   Past Surgical History  Procedure Laterality Date  . Tubal ligation Bilateral 04/03/2003    Dr. Sylvester Harder  . Exploratory tympanotomy Right 06/05/2007    Hermelinda Medicus, M.D.  . Esophagogastroduodenoscopy N/A 11/01/2012    Procedure: ESOPHAGOGASTRODUODENOSCOPY (EGD);  Surgeon: Theda Belfast, MD;  Location: [facility] ENDOSCOPY;  Service: Endoscopy;  Laterality: N/A;   Family History  Problem Relation Age of Onset  . Hypertension Mother   . Heart disease Mother   . Diabetes Father   . Diabetes Sister   . Cancer Brother 24    Brain  cancer    History  Substance Use Topics  . Smoking status: Former Smoker -- 0.25 packs/day for 20 years    Types: Cigarettes    Quit date: 09/07/2012  . Smokeless tobacco: Not on file  . Alcohol Use: No   OB History   Grav Para Term Preterm Abortions TAB SAB Ect Mult Living                 Review of Systems  Genitourinary: Negative for dysuria.  Musculoskeletal: Positive for arthralgias (left hip pain) and back pain.  Neurological: Negative for weakness and numbness.  All other systems reviewed and are negative.    Allergies  Asa  Home Medications   Current Outpatient Rx  Name  Route  Sig  Dispense  Refill  . acetaminophen (TYLENOL) 500 MG tablet   Oral   Take 1,500-2,500 mg by mouth every 6 (six) hours as needed for pain.         . ferrous sulfate 325 (65 FE) MG tablet   Oral   Take 1 tablet (325 mg total) by mouth 3 (three) times daily with meals.   90 tablet   3   . lisinopril (PRINIVIL,ZESTRIL) 20 MG tablet   Oral   Take 1 tablet (20 mg total) by mouth daily.   30 tablet   11   . omeprazole (PRILOSEC) 20 MG capsule   Oral   Take 40 mg by  mouth daily.         . traMADol (ULTRAM) 50 MG tablet   Oral   Take 1 tablet (50 mg total) by mouth every 6 (six) hours as needed for pain.   15 tablet   0    BP 189/121  Pulse 73  Temp(Src) 98.2 F (36.8 C) (Oral)  Resp 16  Ht 5\' 8"  (1.727 m)  Wt 190 lb (86.183 kg)  BMI 28.9 kg/m2  SpO2 99%  LMP 03/31/2013 Physical Exam  Nursing note and vitals reviewed. Constitutional: She is oriented to person, place, and time. She appears well-developed and well-nourished.  HENT:  Head: Normocephalic and atraumatic.  Eyes: EOM are normal.  Neck: Normal range of motion.  Cardiovascular: Normal rate.   Pulmonary/Chest: Effort normal.  Musculoskeletal: Normal range of motion. She exhibits tenderness.  Tenderness to palpation along left groin. No hernia. Decreased ROM of left hip due to pain. Knee flexion limited to  90 degrees due to pain. Full knee extension. Pain with ambulation.   Neurological: She is alert and oriented to person, place, and time.  Skin: Skin is warm and dry.  Psychiatric: She has a normal mood and affect. Her behavior is normal.    ED Course  Procedures   DIAGNOSTIC STUDIES: Oxygen Saturation is 98% on RA, normal by my interpretation.    COORDINATION OF CARE: 10:37 AM Discussed treatment plan with pt at bedside and pt agreed to plan.    Labs Review Labs Reviewed - No data to display Imaging Review Dg Hip Complete Left  04/04/2013   CLINICAL DATA:  Pain  EXAM: LEFT HIP - COMPLETE 2+ VIEW  COMPARISON:  None.  FINDINGS: Frontal pelvis as well as frontal and lateral left hip images were obtained. There is postoperative change with 2 screws present in the left acetabulum. There is subchondral cystic change in the left acetabulum, probably due to secondary osteoarthritis.  No fracture or dislocation. There is slight narrowing of both hip joints. No erosive change or bony destruction.  IMPRESSION: Postoperative change in the left acetabulum with subchondral cystic formation. There is mild hip joint narrowing bilaterally. No fracture or dislocation.   Electronically Signed   By: Bretta Bang M.D.   On: 04/04/2013 11:27    EKG Interpretation   None       MDM   1. Hip pain, left   2. Osteoarthritis    Pt presenting with left groin pain.  No red flag symptoms. Plain films show evidence of osteoarthritis.  Will tx symptomatically.  Pt stated pain did improve some after given percocet in ED.  Rx: tramadol. Advised to f/u with PCP for ongoing left hip pain as she may need referral to physical therapy and/or orthopedics.  Return precautions provided. Pt verbalized understanding and agreement with tx plan.  ,I personally performed the services described in this documentation, which was scribed in my presence. The recorded information has been reviewed and is accurate.   Junius Finner, PA-C 04/04/13 1600

## 2013-04-14 ENCOUNTER — Ambulatory Visit (INDEPENDENT_AMBULATORY_CARE_PROVIDER_SITE_OTHER): Payer: BC Managed Care – PPO | Admitting: Internal Medicine

## 2013-04-14 ENCOUNTER — Encounter: Payer: Self-pay | Admitting: Internal Medicine

## 2013-04-14 VITALS — BP 115/82 | HR 79 | Temp 98.5°F | Ht 68.0 in | Wt 188.9 lb

## 2013-04-14 DIAGNOSIS — R011 Cardiac murmur, unspecified: Secondary | ICD-10-CM

## 2013-04-14 DIAGNOSIS — M25559 Pain in unspecified hip: Secondary | ICD-10-CM

## 2013-04-14 DIAGNOSIS — Z23 Encounter for immunization: Secondary | ICD-10-CM

## 2013-04-14 DIAGNOSIS — G8929 Other chronic pain: Secondary | ICD-10-CM

## 2013-04-14 DIAGNOSIS — D72829 Elevated white blood cell count, unspecified: Secondary | ICD-10-CM

## 2013-04-14 DIAGNOSIS — I10 Essential (primary) hypertension: Secondary | ICD-10-CM

## 2013-04-14 DIAGNOSIS — D509 Iron deficiency anemia, unspecified: Secondary | ICD-10-CM

## 2013-04-14 LAB — CBC WITH DIFFERENTIAL/PLATELET
Basophils Absolute: 0 10*3/uL (ref 0.0–0.1)
Basophils Relative: 0 % (ref 0–1)
Eosinophils Absolute: 0.4 10*3/uL (ref 0.0–0.7)
Eosinophils Relative: 3 % (ref 0–5)
Lymphs Abs: 3.6 10*3/uL (ref 0.7–4.0)
MCH: 34.9 pg — ABNORMAL HIGH (ref 26.0–34.0)
MCHC: 34.5 g/dL (ref 30.0–36.0)
MCV: 101.1 fL — ABNORMAL HIGH (ref 78.0–100.0)
Neutrophils Relative %: 60 % (ref 43–77)
Platelets: 386 10*3/uL (ref 150–400)
RDW: 14.5 % (ref 11.5–15.5)

## 2013-04-14 MED ORDER — ACETAMINOPHEN 500 MG PO TABS: 1000.0000 mg | ORAL_TABLET | Freq: Two times a day (BID) | ORAL | Status: DC | PRN

## 2013-04-14 NOTE — Assessment & Plan Note (Signed)
BP Readings from Last 3 Encounters:  04/14/13 115/82  04/04/13 189/121  02/03/13 121/88    Lab Results  Component Value Date   NA 139 11/08/2012   K 3.6 11/08/2012   CREATININE 0.72 11/08/2012    Assessment: Blood pressure control: controlled Progress toward BP goal:  at goal Comments:   Plan: Medications:  continue current medications Educational resources provided: brochure Self management tools provided:   Other plans:

## 2013-04-14 NOTE — Progress Notes (Signed)
Patient ID: Stacy Anderson, female   DOB: 10/05/1965, 47 y.o.   MRN: [id number]   Subjective:   HPI: [name] is a 47 y.o. woman with past medical history of hypertension, hearing loss in both ears, and chronic left hip pain, presents to the clinic for an ED f/u visit.   She was seen in ED on 04/04/2013 for worsening of Lt hip pain after long period of standing at work (>8hrs). In ED Left hip x ray revealed: Postoperative change in the left acetabulum with subchondral cystic formation. There is mild hip joint narrowing bilaterally. No fracture or dislocation.  She was discharged with Tramadol and now left with 2 pills. Pt's pain normal is well controlled on Tylenol 1,000 mg bid prn. She feels that the pain is back to baseline of 3/10 since her ED visit. She does not wish to have an operative intervention or injections into her hip. She therefore declines referral to orthopedic or [facility] for now. She wants to continue with Tylenol for now since the pain is only worsened when she is require to work long shifts at her job. I encouraged her to consider these options in the future for her pain management.   She has been compliant with her Lisinopril for HTN. BP is 115/82.   Kindly see the A&P for the status of the pt's chronic medical problems.     Past Medical History  Diagnosis Date  . Hypertension   . Sensorineural hearing loss of both ears     likely secondary to the radiation therapy, chemotherapy and the vascular flow issues  . Left carotid artery stenosis   . Herpes genitalis 12/30/2008  . 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 acetaminophen (TYLENOL) 500 MG tablet Take 2 tablets (1,000 mg total) by mouth every 12  (twelve) hours as needed for pain.  30 tablet  0  . ferrous sulfate 325 (65 FE) MG tablet Take 1 tablet (325 mg total) by mouth 3 (three) times daily with meals.  90 tablet  3  . lisinopril (PRINIVIL,ZESTRIL) 20 MG tablet Take 1 tablet (20 mg total) by mouth daily.  30 tablet  11  . omeprazole (PRILOSEC) 20 MG capsule Take 40 mg by mouth daily.       No current facility-administered medications for this visit.   Family History  Problem Relation Age of Onset  . Hypertension Mother   . Heart disease Mother   . Diabetes Father   . Diabetes Sister   . Cancer Brother 31    Brain cancer    History   Social History  . Marital Status: Single    Spouse Name: N/A    Number of Children: 0  . Years of Education: 12th grade   Occupational History  . [employer]   Social History Main Topics  . Smoking status: Former Smoker -- 0.25 packs/day for 20 years    Types: Cigarettes    Quit date: 09/07/2012  . Smokeless tobacco: Not on file  . Alcohol Use: No  . Drug Use: No  . Sexual Activity: Not on file   Other Topics Concern  . Not on file   Social History Narrative  Lives in [place], alone   Review of Systems: Constitutional: Denies fever, chills, diaphoresis, appetite change and fatigue.  Respiratory: Denies SOB, DOE, cough, chest tightness, and wheezing.  Cardiovascular: No chest pain, palpitations and leg swelling.  Gastrointestinal: No abdominal pain, nausea, vomiting, bloody stools Genitourinary: No dysuria, frequency, hematuria, or flank pain.    Objective:  Physical Exam: Filed Vitals:   04/14/13 1621 04/14/13 1624  BP: 115/82 115/82  Pulse:  79  Temp:  98.5 F (36.9 C)  TempSrc:  Oral  Height: 5\' 8"  (1.727 m) 5\' 8"  (1.727 m)  Weight: 188 lb (85.276 kg) 188 lb 14.4 oz (85.684 kg)  SpO2:  100%   General: Well nourished. No acute distress. Uses bilateral hearing aids Lungs: CTA bilaterally. Heart: RRR; no extra sounds or murmurs  Abdomen:  Non-distended, normal BS, soft, nontender; no hepatosplenomegaly  Extremities: No pedal edema. Internal and external rotation of her left hip elicits pain, lateral left hip TTP Neurologic: Alert and oriented x3. No obvious neurologic deficits.  Assessment & Plan:  I have discussed my assessment and plan  with Dr. Dalphine Handing as detailed under problem based charting.

## 2013-04-14 NOTE — Patient Instructions (Addendum)
General Instructions: I will check your blood level today and I will give you a call  Please continue to take your iron pills  Please call Dr Haywood Pao office for a colonoscopy to be sure what is causing your low iron levels  Please take tylenol for your pain   Treatment Goals:  Goals (1 Years of Data) as of 04/14/13         As of Today As of Today 04/04/13 04/04/13 04/04/13     Blood Pressure    . Blood Pressure < 140/90  115/82 115/82 189/121 181/130 162/102      Progress Toward Treatment Goals:  Treatment Goal 04/14/2013  Blood pressure at goal    Self Care Goals & Plans:  Self Care Goal 04/14/2013  Manage my medications bring my medications to every visit; take my medicines as prescribed; refill my medications on time  Eat healthy foods drink diet soda or water instead of juice or soda; eat more vegetables; eat foods that are low in salt; eat baked foods instead of fried foods  Be physically active -  Other -    No flowsheet data found.   Care Management & Community Referrals:  Referral 04/14/2013  Referrals made for care management support none needed

## 2013-04-14 NOTE — Assessment & Plan Note (Signed)
Complaint with iron supplementation. No side effects like constipation.  Plan  - pt to make appt with Dr Haywood Pao office for a colonoscopy - CBC with diff today  - cont with Fe for now

## 2013-04-14 NOTE — Assessment & Plan Note (Signed)
Will evaluate this with CBC with diff today and peripheral blood smear  Will consider referral to hematology is leukocytosis persists.

## 2013-04-14 NOTE — Assessment & Plan Note (Signed)
Patient prefers to continue with Tylenol. Encouraged her to consider PT, [facility] or orthopedic referral in future.   Plan  Will cont with Tylenol for now.  Consider her not to exceed 3g/d

## 2013-04-15 ENCOUNTER — Encounter: Payer: Self-pay | Admitting: Internal Medicine

## 2013-04-18 NOTE — Progress Notes (Signed)
Case discussed with Dr.Kazibwe at the time of the visit.  We reviewed the resident's history and exam and pertinent patient test results.  I agree with the assessment, diagnosis, and plan of care documented in the resident's note.    

## 2013-04-24 ENCOUNTER — Encounter: Payer: Self-pay | Admitting: Internal Medicine

## 2013-06-16 ENCOUNTER — Ambulatory Visit: Payer: BC Managed Care – PPO

## 2013-06-23 ENCOUNTER — Ambulatory Visit: Payer: BC Managed Care – PPO

## 2013-11-13 ENCOUNTER — Other Ambulatory Visit: Payer: Self-pay | Admitting: *Deleted

## 2013-11-13 MED ORDER — OMEPRAZOLE 20 MG PO CPDR: 40.0000 mg | DELAYED_RELEASE_CAPSULE | Freq: Every day | ORAL | Status: DC

## 2014-01-08 ENCOUNTER — Other Ambulatory Visit: Payer: Self-pay | Admitting: *Deleted

## 2014-01-09 MED ORDER — LISINOPRIL 20 MG PO TABS
20.0000 mg | ORAL_TABLET | Freq: Every day | ORAL | Status: DC
Start: ? — End: 2015-01-11

## 2014-01-09 NOTE — Telephone Encounter (Signed)
Rx called in 

## 2014-03-25 ENCOUNTER — Encounter: Payer: BC Managed Care – PPO | Admitting: Internal Medicine

## 2014-03-25 ENCOUNTER — Encounter: Payer: Self-pay | Admitting: Internal Medicine

## 2014-07-21 ENCOUNTER — Telehealth: Payer: Self-pay | Admitting: Internal Medicine

## 2014-07-21 NOTE — Telephone Encounter (Signed)
Call to patient to confirm appointment for 07/22/14 at 1:15. Unable to leave message

## 2014-07-22 ENCOUNTER — Encounter: Payer: Self-pay | Admitting: Internal Medicine

## 2014-07-22 ENCOUNTER — Ambulatory Visit (INDEPENDENT_AMBULATORY_CARE_PROVIDER_SITE_OTHER): Payer: BLUE CROSS/BLUE SHIELD | Admitting: Internal Medicine

## 2014-07-22 VITALS — BP 138/82 | HR 86 | Temp 97.5°F | Ht 68.0 in | Wt 213.8 lb

## 2014-07-22 DIAGNOSIS — G8929 Other chronic pain: Secondary | ICD-10-CM | POA: Diagnosis not present

## 2014-07-22 DIAGNOSIS — M25552 Pain in left hip: Secondary | ICD-10-CM

## 2014-07-22 DIAGNOSIS — I6529 Occlusion and stenosis of unspecified carotid artery: Secondary | ICD-10-CM | POA: Insufficient documentation

## 2014-07-22 DIAGNOSIS — D72829 Elevated white blood cell count, unspecified: Secondary | ICD-10-CM

## 2014-07-22 DIAGNOSIS — E669 Obesity, unspecified: Secondary | ICD-10-CM | POA: Insufficient documentation

## 2014-07-22 DIAGNOSIS — D509 Iron deficiency anemia, unspecified: Secondary | ICD-10-CM | POA: Diagnosis not present

## 2014-07-22 DIAGNOSIS — I10 Essential (primary) hypertension: Secondary | ICD-10-CM

## 2014-07-22 DIAGNOSIS — H903 Sensorineural hearing loss, bilateral: Secondary | ICD-10-CM | POA: Insufficient documentation

## 2014-07-22 LAB — CBC WITH DIFFERENTIAL/PLATELET
Basophils Absolute: 0 10*3/uL (ref 0.0–0.1)
Basophils Relative: 0 % (ref 0–1)
EOS ABS: 0.6 10*3/uL (ref 0.0–0.7)
Eosinophils Relative: 4 % (ref 0–5)
HEMATOCRIT: 37.7 % (ref 36.0–46.0)
Hemoglobin: 12.6 g/dL (ref 12.0–15.0)
LYMPHS PCT: 28 % (ref 12–46)
Lymphs Abs: 4.2 10*3/uL — ABNORMAL HIGH (ref 0.7–4.0)
MCH: 34.1 pg — ABNORMAL HIGH (ref 26.0–34.0)
MCHC: 33.4 g/dL (ref 30.0–36.0)
MCV: 102.2 fL — ABNORMAL HIGH (ref 78.0–100.0)
MONO ABS: 0.7 10*3/uL (ref 0.1–1.0)
MPV: 9.1 fL (ref 8.6–12.4)
Monocytes Relative: 5 % (ref 3–12)
Neutro Abs: 9.4 10*3/uL — ABNORMAL HIGH (ref 1.7–7.7)
Neutrophils Relative %: 63 % (ref 43–77)
Platelets: 426 10*3/uL — ABNORMAL HIGH (ref 150–400)
RBC: 3.69 MIL/uL — ABNORMAL LOW (ref 3.87–5.11)
RDW: 12.8 % (ref 11.5–15.5)
WBC: 14.9 10*3/uL — AB (ref 4.0–10.5)

## 2014-07-22 LAB — COMPLETE METABOLIC PANEL WITH GFR
ALT: 9 U/L (ref 0–35)
AST: 16 U/L (ref 0–37)
Albumin: 3.8 g/dL (ref 3.5–5.2)
Alkaline Phosphatase: 69 U/L (ref 39–117)
BUN: 24 mg/dL — AB (ref 6–23)
CO2: 22 mEq/L (ref 19–32)
Calcium: 9.6 mg/dL (ref 8.4–10.5)
Chloride: 108 mEq/L (ref 96–112)
Creat: 0.94 mg/dL (ref 0.50–1.10)
GFR, EST AFRICAN AMERICAN: 83 mL/min
GFR, EST NON AFRICAN AMERICAN: 72 mL/min
GLUCOSE: 80 mg/dL (ref 70–99)
Potassium: 4 mEq/L (ref 3.5–5.3)
SODIUM: 141 meq/L (ref 135–145)
Total Bilirubin: 0.3 mg/dL (ref 0.2–1.2)
Total Protein: 6.8 g/dL (ref 6.0–8.3)

## 2014-07-22 LAB — IRON: IRON: 57 ug/dL (ref 42–145)

## 2014-07-22 MED ORDER — ACETAMINOPHEN-CODEINE #3 300-30 MG PO TABS: 1.0000 | ORAL_TABLET | Freq: Three times a day (TID) | ORAL | Status: DC | PRN

## 2014-07-22 NOTE — Assessment & Plan Note (Signed)
This is related to osteoarthritis in the setting of prior surgery of the left hip with screws in place. I'm concerned about her risk of acetaminophen toxicity given the amount of Tylenol she takes to control the pain. I discussed with the patient that she should not take more than 4 g of Tylenol a day. We cannot use nonsteroidal anti-inflammatory medication due to GI upset and skin eruptions which she has notes with aspirin and Aleve.  Plan -She is now agreeable to referral to orthopedic surgery -I will start her on Tylenol No.3 1-2 pills every 8 hours when necessary -She is very hesitant about opiate analgesics. -We will order CMP to assess liver function given significant Tylenol use. -Follow-up in one month to evaluate the pain

## 2014-07-22 NOTE — Assessment & Plan Note (Signed)
Discussed risks and benefits of over-the-counter weight loss products. Unfortunately, she is unable to exercise due to her bad hip. Plan -Referral to nutritional counseling by Butch Penny

## 2014-07-22 NOTE — Patient Instructions (Signed)
General Instructions: Please stop taking Tylenol  Please start taking Tylenol #3 1-2 pills every 8hrs as needed  Please see Butch Penny for dietary and weight loss counselling I will make a referral to orthopedic surgery for your left hip    Treatment Goals:  Goals (1 Years of Data) as of 07/22/14          As of Today 04/14/13     Blood Pressure   . Blood Pressure < 140/90  138/82 115/82      Progress Toward Treatment Goals:  Treatment Goal 07/22/2014  Blood pressure at goal    Self Care Goals & Plans:  Self Care Goal 07/22/2014  Manage my medications take my medicines as prescribed; bring my medications to every visit; refill my medications on time  Eat healthy foods -  Be physically active find an activity I enjoy; find workout friends  Other -    No flowsheet data found.   Care Management & Community Referrals:  Referral 04/14/2013  Referrals made for care management support none needed

## 2014-07-22 NOTE — Assessment & Plan Note (Signed)
Recheck CBC. 

## 2014-07-22 NOTE — Assessment & Plan Note (Signed)
See details of her evaluation in the overview. Not compliant with iron. No evidence of clinical anemia. Plan -We'll obtain a CBC and iron panel to decide if she needs by mouth iron supplementation.

## 2014-07-22 NOTE — Assessment & Plan Note (Addendum)
BP Readings from Last 3 Encounters:  07/22/14 138/82  04/14/13 115/82  04/04/13 189/121    Lab Results  Component Value Date   NA 139 11/08/2012   K 3.6 11/08/2012   CREATININE 0.72 11/08/2012    Assessment: Blood pressure control: controlled Progress toward BP goal:  at goal Comments: compliant.  Plan: Medications:  Lisinopril 20 mg daily Educational resources provided:   Self management tools provided: home blood pressure logbook Other plans: Follow-up routinely

## 2014-07-22 NOTE — Progress Notes (Signed)
Patient ID: ABAGALE BOULOS, female   DOB: 01-Oct-1965, 49 y.o.   MRN: [id number]   Subjective:   HPI: Ms.Kolbie GLADIOLA MADORE is a 49 y.o. woman with past medical history of essential hypertension, chronic left hip pain, and bilateral hearing loss presents for follow-up visit.  Reason(s) for this visit: 1. Left hip pain: This continues to be most important problem. She takes Tylenol 500 mg 4-5 pills every 4-5 times daily. Patient has a history of left hip surgery and hip x-ray from October 2014, revealed postoperative changes with likely arthritis. She has been hesitant to see orthopedic surgery due to concern of more invasive procedures. She works in [employer] in at [employer] and her job has been significantly compromised due to persistent pain. Unfortunately, she has reaction to nonsteroidal anti-inflammatory medications. 2. Weight gain: Patient has gained about 20 pounds over the past year. She currently weighs 213 pounds. She asks about weight loss products including Hydroxycut which would like to try. I will send her to [facility] for dietary counseling. 3. Hypertension: BP 138/82. Well controlled on lisinopril. 4. Iron deficiency anemia: She doesn't have symptoms of anemia currently. She states that she is not been very compliant with her supplementary oral iron. Will check an anemia panel today.  ROS: Constitutional: Denies fever, chills, diaphoresis, appetite change and fatigue.  Respiratory: Denies SOB, DOE, cough, chest tightness, and wheezing. Denies chest pain. CVS: No chest pain, palpitations and leg swelling.  GI: No abdominal pain, nausea, vomiting, bloody stools GU: No dysuria, frequency, hematuria, or flank pain.  MSK: Left hip pain. No myalgias. Psych: No depression symptoms. No SI or SA.    Objective:  Physical Exam: Filed Vitals:   07/22/14 1533  BP: 138/82  Pulse: 86  Temp: 97.5 F (36.4 C)  TempSrc: Oral  Height: 5\' 8"  (1.727 m)  Weight: 213 lb 12.8 oz (96.979 kg)    SpO2: 100%   General: Well nourished. No acute distress.  HEENT: Uses hearing aids bilaterally. Normal oral mucosa. MMM.  Lungs: CTA bilaterally. Heart: RRR; no extra sounds or murmurs  Abdomen: Non-distended, normal bowel sounds, soft, nontender; no hepatosplenomegaly  Extremities: Left external and internal rotation reproduces significant tenderness. No pedal edema. No joint swelling. Neurologic: Normal EOM,  Alert and oriented x3. No obvious neurologic/cranial nerve deficits.  Assessment & Plan:  Discussed case with my attending in the clinic, Dr. Dareen Piano. See problem based charting.

## 2014-07-23 LAB — FERRITIN: FERRITIN: 25 ng/mL (ref 10–291)

## 2014-07-27 NOTE — Progress Notes (Signed)
INTERNAL MEDICINE TEACHING ATTENDING ADDENDUM - Arelly Whittenberg, MD: I reviewed and discussed at the time of visit with the resident Dr. Kazibwe, the patient's medical history, physical examination, diagnosis and results of pertinent tests and treatment and I agree with the patient's care as documented.  

## 2014-08-27 ENCOUNTER — Encounter: Payer: BLUE CROSS/BLUE SHIELD | Admitting: Dietician

## 2014-08-27 NOTE — Addendum Note (Signed)
Addended by: Hulan Fray on: 08/27/2014 06:32 AM   Modules accepted: Orders

## 2014-08-28 ENCOUNTER — Encounter: Payer: Self-pay | Admitting: Dietician

## 2014-08-28 ENCOUNTER — Telehealth: Payer: Self-pay | Admitting: Dietician

## 2014-08-28 ENCOUNTER — Ambulatory Visit (INDEPENDENT_AMBULATORY_CARE_PROVIDER_SITE_OTHER): Payer: BLUE CROSS/BLUE SHIELD | Admitting: Dietician

## 2014-08-28 VITALS — Ht 68.0 in | Wt 213.3 lb

## 2014-08-28 DIAGNOSIS — E669 Obesity, unspecified: Secondary | ICD-10-CM | POA: Diagnosis not present

## 2014-08-28 NOTE — Patient Instructions (Addendum)
Plan for weight maintenance and eventually loss:   Change #1- decrease sugar in tea by mixing sweet and unsweet tea while you are work.          Measure how sugar you are putting in yoru hot tea at home and write it on the lone below.    How much sugar I put in my tea at home: __________________

## 2014-08-28 NOTE — Telephone Encounter (Signed)
Patient asked RD to call for coverage of her visits: reference number for call is [phone]. MNT is covered 100% after a 25$ copay each visit, No limitation on visits.

## 2014-08-28 NOTE — Progress Notes (Signed)
  Medical Nutrition Therapy:  Appt start time: 2330 end time:  1125.  Assessment:  Primary concerns today: weight gain. She gained 3 more pounds in past few weeks and wants to lose 50#.  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 She says she snacks, drinks water and sweet tea all day. 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  Weight history: Lost from ~ 340# to < 200# during cancer treatment ~ 28 years ago. Has lost weight ( reports was down to ~ 165#) doing an "ice" diet, but has regained. Note in her chart progressive weight gain form 164# in 2014 until today  Says she is a morning person, gets very tired at night especially on work days. Has a scale she can use at work. Has measuring cups and spoons at home. Says she is "not a big eater". Rates overall health as "Good". Stress is " none" Preferred Learning Style: No preference indicated  Learning Readiness: Ready MEDICATIONS: stopped iron 5-6 months ago because it was making her stool hard, need to ask her about her use of the prilosec Weight: 213.3,  DIETARY INTAKE: Usual eating pattern includes 2-3 meals and 1-2 snacks per day. Everyday foods include sweet tea, 1-2 fruits and 2 vegetables. Doesn't eat  Avoided foods include milk, yogurt, ice cream, cheese.   24-hr recall:  B ( 5 AM): 2 slices toast with mustard and 2 slices bacon. Hot tea or water, whole grapefruit daily Snk ( AM): coffee and candy bar L ( 3 PM): sandwich on honey wheat bread or hamburger and fries from cook out D ( 8-9 PM): cooks on sundays a meat and two vegetables, then eats this  Snk ( PM): cookies Beverages: water, sweet tea- she estimated 5 glasses of 20 ounces each (~ 100 calories/day)   Usual physical activity: active 6 hours a day at work as a Training and development officer, walks dogs and ride bike on off days, enjoys being outside Estimated energy intake by recall> 2500 calories/day Estimated energy needs for weight loss: 1750-1850 calories ~  200 g carbohydrates 85-100 g protein ~55-65 g fat  Progress Towards Goal(s):  In progress.   Nutritional Diagnosis:  NI-5.8.2 Excessive carbohydrate intake As related to excess sugar intake.  As evidenced by her report of sweet tea intake and cookies and candy as well as chronic problem with weight gain..    Intervention:  Nutrition education about lifestyle change, basics of good nutrition and weight loss . Teaching Method Utilized: Visual,,Auditory,,Hands on Handouts given during visit include: AVs and plate method Barriers to learning/adherence to lifestyle change:not discussed today  Demonstrated degree of understanding via:  Teach Back   Monitoring/Evaluation:  Dietary intake, exercise,  and body weight in 3 week(s).

## 2014-09-17 ENCOUNTER — Telehealth: Payer: Self-pay | Admitting: Dietician

## 2014-09-17 NOTE — Telephone Encounter (Signed)
Call to patient to confirm appointment for 09/18/14 at 1:30 lmtcb

## 2014-09-18 ENCOUNTER — Ambulatory Visit: Payer: BLUE CROSS/BLUE SHIELD | Admitting: Dietician

## 2014-10-23 ENCOUNTER — Telehealth: Payer: Self-pay | Admitting: *Deleted

## 2014-10-23 NOTE — Telephone Encounter (Signed)
Return call to pt - has already made an appt with Dr Constance Holster - ENT for today 10/23/14 PM due to ear pain. Hilda Blades Aleeta Schmaltz RN 10/23/14/11:25AM

## 2014-10-27 ENCOUNTER — Other Ambulatory Visit: Payer: Self-pay | Admitting: Otolaryngology

## 2014-10-27 DIAGNOSIS — R590 Localized enlarged lymph nodes: Secondary | ICD-10-CM

## 2014-10-27 DIAGNOSIS — Z85819 Personal history of malignant neoplasm of unspecified site of lip, oral cavity, and pharynx: Secondary | ICD-10-CM

## 2014-11-02 ENCOUNTER — Encounter: Payer: Self-pay | Admitting: *Deleted

## 2014-11-06 ENCOUNTER — Ambulatory Visit
Admission: RE | Admit: 2014-11-06 | Discharge: 2014-11-06 | Disposition: A | Discharge status: Home / Self Care | Payer: BLUE CROSS/BLUE SHIELD | Source: Ambulatory Visit | Attending: Otolaryngology | Admitting: Otolaryngology

## 2014-11-06 DIAGNOSIS — R590 Localized enlarged lymph nodes: Secondary | ICD-10-CM

## 2014-11-06 DIAGNOSIS — Z85819 Personal history of malignant neoplasm of unspecified site of lip, oral cavity, and pharynx: Secondary | ICD-10-CM

## 2014-11-06 MED ORDER — IOPAMIDOL (ISOVUE-300) INJECTION 61%
75.0000 mL | Freq: Once | INTRAVENOUS | Status: AC | PRN
  Administered 2014-11-06: 75 mL via INTRAVENOUS

## 2015-01-06 ENCOUNTER — Other Ambulatory Visit: Payer: Self-pay | Admitting: *Deleted

## 2015-01-06 NOTE — Telephone Encounter (Signed)
Tried to call pt at both #'s home has been disconnected, mobile, lm to rtc, pt last appt 07/2014, f/u was to be 1 mon later, no show, have set appt for mon 7/20 at 1345, will confirm when pt calls back, have ask for 1 month supply of lisinopril and omeprazole

## 2015-01-11 ENCOUNTER — Encounter: Payer: Self-pay | Admitting: Internal Medicine

## 2015-01-11 ENCOUNTER — Encounter: Payer: BLUE CROSS/BLUE SHIELD | Admitting: Internal Medicine

## 2015-01-11 ENCOUNTER — Ambulatory Visit (INDEPENDENT_AMBULATORY_CARE_PROVIDER_SITE_OTHER): Payer: Self-pay | Admitting: Internal Medicine

## 2015-01-11 VITALS — BP 102/65 | HR 85 | Temp 98.6°F | Ht 68.0 in | Wt 208.4 lb

## 2015-01-11 DIAGNOSIS — R1310 Dysphagia, unspecified: Secondary | ICD-10-CM

## 2015-01-11 DIAGNOSIS — I10 Essential (primary) hypertension: Secondary | ICD-10-CM

## 2015-01-11 HISTORY — DX: Dysphagia, unspecified: R13.10

## 2015-01-11 MED ORDER — OMEPRAZOLE 20 MG PO CPDR: 40.0000 mg | DELAYED_RELEASE_CAPSULE | Freq: Every day | ORAL | Status: DC

## 2015-01-11 MED ORDER — LISINOPRIL 20 MG PO TABS: 20.0000 mg | ORAL_TABLET | Freq: Every day | ORAL | Status: DC

## 2015-01-11 MED ORDER — TRAMADOL HCL 50 MG PO TABS: 50.0000 mg | ORAL_TABLET | Freq: Three times a day (TID) | ORAL | Status: DC | PRN

## 2015-01-11 NOTE — Patient Instructions (Addendum)
Thank you for your visit today.  Please follow up with your ENT doctor to discuss the results of the CT scan of your neck. - We have printed out a copy of your report.  Please feel free to let us know if you have any questions, or need help in making the appointment referral to the ENT doctor.  We will do your labs at the next visit, and per the recommendation of the ENT doctor.

## 2015-01-11 NOTE — Assessment & Plan Note (Signed)
BP Readings from Last 3 Encounters:  01/11/15 102/65  07/22/14 138/82  04/14/13 115/82  At goal, continue current meds, and refilled it.

## 2015-01-11 NOTE — Progress Notes (Signed)
Internal Medicine Clinic Attending  I saw and evaluated the patient.  I personally confirmed the key portions of the history and exam documented by Dr. Saraiya and I reviewed pertinent patient test results.  The assessment, diagnosis, and plan were formulated together and I agree with the documentation in the resident's note.  

## 2015-01-11 NOTE — Assessment & Plan Note (Signed)
PE: continued dysphagia of the neck, and soreness, oropharynx normal, 0.5 cm mass palpated in the right neck. Per patient, she had seen ENT Dr. Constance Holster in May. She had underwent a CT of the neck for the dysphagia on 11/06/2014. She was not informed of the results of the CT scan, but had an appointment scheduled with them on 01/05/2015, which she was unable to go due to some insurance issues.  We went over the results of the CT scan, which shows likely a new carcinoma, and printed the results. We informed her that she should make a prompt follow up with ENT to discuss the results and next steps which may include laryngoscopy. 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 Abnormal soft tissue distorts the normal parapharyngeal fat planes, extending posteriorly towards the right carotid space. Correlation with the patient's prior head neck carcinoma location recommended, as well as referral to ENT for laryngoscopy.  Bilateral parotid glands and submandibular glands are atrophic or absent. This may be related to prior treatment effects/ surgery.  Persistent left carotid artery occlusion from the origin to the base of the skull.  The left brachycephalic vein appears stenotic or potentially occluded with multiple chest wall collaterals draining the upper extremity.  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  Permeated appearance of the bony skull base. This may reflect prior treatment changes. Correlation with location of prior head and neck radiation field would be useful given the patient's history of prior treatment for head and neck carcinoma.  Right-sided carotid calcifications.  Signed,  Dulcy Fanny. Earleen Newport, DO  [facility]  [facility]

## 2015-01-11 NOTE — Progress Notes (Signed)
Patient ID: Stacy Anderson, female   DOB: December 22, 1965, 49 y.o.   MRN: [id number]    Subjective:   Patient ID: Stacy Anderson female   DOB: 23-Feb-1966 49 y.o.   MRN: [id number]  HPI: Stacy Anderson is a 49 y.o. woman who is here for a concern about pain when swallowing and soreness in her right neck for about 2 months, and med refills  Other PMH noted below    Past Medical History  Diagnosis Date  . Hypertension   . Sensorineural hearing loss of both ears     likely secondary to the radiation therapy, chemotherapy and the vascular flow issues  . Left carotid artery stenosis   . Herpes genitalis 12/30/2008  . 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 Unspecified hearing loss 05/05/2010    Annotation: bil hearing aids since age 77 - nasal cancer Qualifier: Diagnosis of  By: Hassell Done FNP, Tori Milks    . 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 acetaminophen-codeine (TYLENOL #3) 300-30 MG per tablet Take 1-2 tablets by mouth every 8 (eight) hours as needed for moderate pain. 120 tablet 0  . ferrous sulfate 325 (65 FE) MG tablet Take 1 tablet (325 mg total) by mouth 3 (three) times daily with meals. (Patient not taking: Reported on 08/28/2014) 90 tablet 3  . lisinopril (PRINIVIL,ZESTRIL) 20 MG tablet Take 1 tablet (20 mg total) by mouth daily. 30 tablet 11  . omeprazole (PRILOSEC) 20 MG capsule Take 2 capsules (40 mg total) by mouth daily. 180 capsule 4  . traMADol (ULTRAM) 50 MG tablet Take 1 tablet (50 mg total) by mouth every 8 (eight) hours as needed. 30 tablet 0   No current facility-administered medications for this visit.   Family History  Problem Relation Age of Onset  . Hypertension Mother   . Heart disease Mother   . Diabetes Father   . Diabetes Sister   . Cancer Brother 59   Brain cancer    History   Social History  . Marital Status: Single    Spouse Name: N/A  . Number of Children: 0  . Years of Education: 12th grade   Occupational History  . [employer]   Social History Main Topics  . Smoking status: Former Smoker -- 0.25 packs/day for 20 years    Types: Cigarettes    Quit date: 09/07/2012  . Smokeless tobacco: Not on file  . Alcohol Use: No  . Drug Use: No  . Sexual Activity: Not on file   Other Topics Concern  . None   Social History Narrative   Lives in [place], alone   Review of Systems: Review of Systems  Constitutional: Positive for weight loss. Negative for fever, chills, malaise/fatigue and diaphoresis.  HENT: Negative for ear pain, hearing loss and sore throat.        Dysphagia  Eyes: Negative.   Respiratory: Negative for cough, hemoptysis, shortness of breath and wheezing.   Cardiovascular: Negative.  Negative for chest pain, palpitations and leg swelling.  Gastrointestinal: Negative.  Negative for heartburn, nausea, vomiting, abdominal pain, diarrhea and constipation.  Musculoskeletal: Negative.   Neurological: Negative.  Negative for dizziness, weakness and headaches.  All other systems reviewed and are negative.   Objective:  Physical Exam: Danley Danker  Vitals:   01/11/15 1557  BP: 102/65  Pulse: 85  Temp: 98.6 F (37 C)  TempSrc: Oral  Height: 5\' 8"  (1.727 m)  Weight: 208 lb 6.4 oz (94.53 kg)  SpO2: 98%   Physical Exam  Constitutional: She is oriented to person, place, and time. She appears well-developed and well-nourished.  Neck: Normal range of motion. No thyromegaly present.  0.5 cm mobile mass palpated on right neck, right neck tender to palpation, no thyroid enlargement   Cardiovascular: Normal rate, regular rhythm, normal heart sounds and intact distal pulses.   No murmur heard. Pulmonary/Chest: Effort normal and breath sounds normal. No respiratory distress. She has no wheezes.  Abdominal: Soft.  Bowel sounds are normal. She exhibits no distension. There is no tenderness.  Neurological: She is alert and oriented to person, place, and time.     Assessment & Plan:   Please see problem based charting for assessment and plan

## 2015-01-12 MED ORDER — OMEPRAZOLE 20 MG PO CPDR: 40.0000 mg | DELAYED_RELEASE_CAPSULE | Freq: Every day | ORAL | Status: DC

## 2015-01-12 MED ORDER — LISINOPRIL 20 MG PO TABS: 20.0000 mg | ORAL_TABLET | Freq: Every day | ORAL | Status: DC

## 2015-01-15 ENCOUNTER — Ambulatory Visit: Payer: Self-pay

## 2015-01-19 ENCOUNTER — Ambulatory Visit: Payer: Self-pay

## 2015-02-03 ENCOUNTER — Telehealth: Payer: Self-pay | Admitting: Internal Medicine

## 2015-02-03 ENCOUNTER — Telehealth: Payer: Self-pay | Admitting: *Deleted

## 2015-02-03 ENCOUNTER — Encounter: Payer: Self-pay | Admitting: Internal Medicine

## 2015-02-03 ENCOUNTER — Ambulatory Visit (INDEPENDENT_AMBULATORY_CARE_PROVIDER_SITE_OTHER): Payer: Self-pay | Admitting: Internal Medicine

## 2015-02-03 VITALS — BP 113/65 | HR 89 | Temp 98.5°F | Wt 212.8 lb

## 2015-02-03 DIAGNOSIS — C760 Malignant neoplasm of head, face and neck: Secondary | ICD-10-CM

## 2015-02-03 DIAGNOSIS — K219 Gastro-esophageal reflux disease without esophagitis: Secondary | ICD-10-CM

## 2015-02-03 DIAGNOSIS — C770 Secondary and unspecified malignant neoplasm of lymph nodes of head, face and neck: Secondary | ICD-10-CM | POA: Insufficient documentation

## 2015-02-03 DIAGNOSIS — I10 Essential (primary) hypertension: Secondary | ICD-10-CM

## 2015-02-03 HISTORY — DX: Malignant neoplasm of head, face and neck: C76.0

## 2015-02-03 MED ORDER — MAGIC MOUTHWASH W/LIDOCAINE: 5.0000 mL | Freq: Four times a day (QID) | ORAL | Status: DC | PRN

## 2015-02-03 MED ORDER — OMEPRAZOLE 40 MG PO CPDR: 40.0000 mg | DELAYED_RELEASE_CAPSULE | Freq: Every day | ORAL | Status: DC

## 2015-02-03 MED ORDER — SUCRALFATE 1 GM/10ML PO SUSP: 1.0000 g | Freq: Four times a day (QID) | ORAL | Status: DC

## 2015-02-03 MED ORDER — OMEPRAZOLE 20 MG PO CPDR: 40.0000 mg | DELAYED_RELEASE_CAPSULE | Freq: Every day | ORAL | Status: DC

## 2015-02-03 MED ORDER — LIDOCAINE VISCOUS 2 % MT SOLN: 20.0000 mL | OROMUCOSAL | Status: DC | PRN

## 2015-02-03 MED ORDER — LISINOPRIL 20 MG PO TABS: 20.0000 mg | ORAL_TABLET | Freq: Every day | ORAL | Status: DC

## 2015-02-03 NOTE — Assessment & Plan Note (Addendum)
Assessment: Pt with history of GERD without PPI therapy who presents with acid reflux symptoms.   Plan:  -Refill omeprazole 40 mg daily

## 2015-02-03 NOTE — Telephone Encounter (Signed)
Called to pharm 

## 2015-02-03 NOTE — Telephone Encounter (Signed)
[facility] does not carry carafate or magic mouthwash. Please send to [facility] and please review the dispensing amount of the magic mouthwash

## 2015-02-03 NOTE — Patient Instructions (Addendum)
-Maime will call you set you up to to see ENT in the next day -Start taking magic mouth wash up to 4 times daily and take carafate 4 times daily for sore throat -I refilled your prilosec for acid reflux  -I refilled your lisinopril for high blood pressure -Very nice meeting you!   Cancer of the Tonsils Cancer of the tonsils occurs when cells on the outside of the tonsils become abnormal and start to grow out of control. This usually starts in very thin, flat cells that line the surface of your mouth (squamous cells). Cancer cells can spread and form a mass of cells called a tumor. The cancer may spread deeper into the tonsils, or it may spread to other areas of the body (metastasize). RISK FACTORS The exact cause of cancer of the tonsils is not known. However, some risk factors make this more likely:  Use of tobacco products, including cigarettes, pipes, cigars, smokeless (chewing) tobacco, and snuff. This is the number one risk factor of cancer of the tonsils.  Female gender.  Age of 2 years or older.  Frequent use of alcohol.  Human papillomavirus (HPV) infection.  Poor oral hygiene (not brushing or flossing your teeth regularly). SYMPTOMS  One tonsil that is larger than the other.  A sore throat (on just one side) that does not go away on its own or after receiving treatment.  A sore or lump on a tonsil that does not go away.  Difficulty swallowing or chewing.  Ear pain.  Bad breath.  Bleeding in your mouth.  A lump on your neck.  Difficulty when trying to open your mouth. DIAGNOSIS To diagnose cancer of the tonsils, your caregiver may perform the following exams:  A physical exam of your tonsils for a sore or lump. Your caregiver will also look at other parts of your mouth, throat, and neck. Your caregiver may use a mirror with a long handle or a thin, flexible tube with a tiny light and camera at the end (fiberscope) to see the back of your mouth.  Removal and exam  of a small number of cells (biopsy) from your tonsils or a lump on your neck. The cells are checked for cancerous formations under a microscope.  Imaging exams, such as X-rays of your mouth and neck. The images can show if there is an abnormal mass. If you do have cancer, your caregiver will stage your cancer. Staging provides an idea of how advanced your cancer is. The stage of your cancer depends on how much your cancer has grown and if it has metastasized. The meaning of the stage depends on the type of cancer. For cancer of the tonsils:  Stage I means the cancer is the size of a peanut or smaller. It has not metastasized.  Stage II means the cancer is larger than a peanut, but not larger than a walnut. It has not metastasized.  Stage III means the cancer has grown larger than a walnut. It may have spread to a lymph node or lymph gland on the same side of your neck as the cancer. (Lymph is a fluid that carries white blood cells all over your body. White blood cells fight infection.)  Stage IV means the cancer has spread to nearby areas. It may have spread heavily into your lymph glands. TREATMENT Treatment for tonsil cancer can vary. It will depend on the stage of your cancer and your overall health. Treatment options may include:  Radiation therapy. This uses  waves of nuclear energy to kill cancer cells on your tonsils. It may be used for stage I or stage II cancers.  Surgery. This may be needed if the cancer has spread to your lymph nodes or if it has spread to other parts of your neck. A procedure may also be done to make it easier for you to swallow and talk.  A combination of radiation, drugs that kill cancer cells (chemotherapy), and surgery. This may be used for stage III and stage IV cancers. SEEK MEDICAL CARE IF:  You have pain in your throat or in your ear.  Your throat is numb.  You notice changes in the way you speak.  You notice changes in the way you swallow.  You  notice a lump on your neck. SEEK IMMEDIATE MEDICAL CARE IF:   You have pain that gets worse. This may be pain in your throat or your ear.  You have bleeding in your mouth.  Your throat or neck swells.  You have difficulty swallowing.  You have difficulty breathing.  You have difficulty opening your mouth.  You have pain in your mouth.  You have a fever. Document Released: 09/20/2010 Document Revised: 08/28/2011 Document Reviewed: 09/20/2010 [facility] Patient Information 2015 [place], [place]. This information is not intended to replace advice given to you by your health care provider. Make sure you discuss any questions you have with your health care provider.   General Instructions:   Please bring your medicines with you each time you come to clinic.  Medicines may include prescription medications, over-the-counter medications, herbal remedies, eye drops, vitamins, or other pills.   Progress Toward Treatment Goals:  Treatment Goal 07/22/2014  Blood pressure at goal    Self Care Goals & Plans:  Self Care Goal 02/03/2015  Manage my medications take my medicines as prescribed; bring my medications to every visit; refill my medications on time; follow the sick day instructions if I am sick  Eat healthy foods eat more vegetables; eat fruit for snacks and desserts; eat smaller portions  Be physically active find an activity I enjoy  Other -    No flowsheet data found.   Care Management & Community Referrals:  Referral 04/14/2013  Referrals made for care management support none needed

## 2015-02-03 NOTE — Assessment & Plan Note (Signed)
Assessment: Pt with well-controlled hypertension compliant with one-class (ACEi) anti-hypertensive therapy who presents with blood pressure of 113/65.   Plan:  -BP 113/65 at goal <140/90 -Refill lisinopril 20 mg daily

## 2015-02-03 NOTE — Telephone Encounter (Signed)
Pharmacy is calling to request a amount to dispense change on rx that was sent today for omeprazole  States that it is only written for 15 days.

## 2015-02-03 NOTE — Progress Notes (Signed)
Patient ID: Stacy Anderson, female   DOB: 13-Jul-1965, 49 y.o.   MRN: [id number]    Subjective:   Patient ID: Stacy Anderson female   DOB: 1965/11/14 49 y.o.   MRN: [id number]  HPI: [name] is a 49 y.o. 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 She was not notified by anyone of these results until she was seen recently by her PCP on 01/11/15. She reports her insurance stopped shortly after the CT imaging was performed and was consequently denied to be seen by ENT. She has prior history of nasal cavity and maxillary sinus cancer when she was 49 years old that was treated with chemotherapy and radiation with resulting bilateral sensorineural hearing loss. She used to smoke cigarretes for a limited number of years in the past but has not been smoking for many years. She denies alcohol use. She has never had HPV infection in the past. She has tried tramadol, tylenol, and ibuprofen for the pain with no relief. She has dysphagia to solids and liquids with odynophagia, tenderness of her right neck, jaw pain, right ear pain, tinnitus, trismus, voice hoarseness, headache, and the feeling of a lump in her throat. She is unsure if her right sided neck mass has been growing in size. Her symptoms are worse at night. She denies choking, dyspnea, cough, inability to tolerate oral secretions, bleeding, tooth pain (has dentures), vertigo, or vision changes. She has not had chest  imaging.   She has been out of her prilosec for GERD for an unclear number of months. She reports acid reflux symptoms.    She is compliant with taking lisinopril for hypertension.        Past Medical History  Diagnosis Date  . Hypertension   . Sensorineural hearing loss of both ears     likely secondary to the radiation therapy, chemotherapy and the vascular flow issues  . Left carotid artery stenosis   . Herpes genitalis 12/30/2008  . 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 Unspecified hearing loss 05/05/2010    Annotation: bil hearing aids since age 83 - nasal cancer Qualifier: Diagnosis of  By: Hassell Done FNP, Tori Milks    . 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 acetaminophen-codeine (TYLENOL #3) 300-30 MG per tablet Take 1-2 tablets by mouth every 8 (eight) hours as needed for moderate pain. 120 tablet 0  . ferrous sulfate 325 (65 FE) MG tablet Take 1 tablet (325 mg total) by mouth 3 (three) times daily with meals. 90 tablet 3  . lisinopril (PRINIVIL,ZESTRIL) 20 MG tablet Take 1 tablet (20 mg total) by mouth daily. 30 tablet 0  . omeprazole (PRILOSEC) 20 MG capsule Take 2 capsules (40 mg total) by mouth daily. 60 capsule 0  . traMADol (  ULTRAM) 50 MG tablet Take 1 tablet (50 mg total) by mouth every 8 (eight) hours as needed. 30 tablet 0   No current facility-administered medications for this visit.   Family History  Problem Relation Age of Onset  . Hypertension Mother   . Heart disease Mother   . Diabetes Father   . Diabetes Sister   . Cancer Brother 52    Brain cancer    Social History   Social History  . Marital Status: Single    Spouse Name: N/A  . Number of Children: 0  . Years of Education: 12th grade   Occupational History  . [employer]   Social  History Main Topics  . Smoking status: Former Smoker -- 0.25 packs/day for 20 years    Types: Cigarettes    Quit date: 09/07/2012  . Smokeless tobacco: None  . Alcohol Use: No  . Drug Use: No  . Sexual Activity: Not Asked   Other Topics Concern  . None   Social History Narrative   Lives in [place], alone   Review of Systems: Review of Systems  Constitutional: Negative for fever, chills and weight loss.  HENT: Positive for ear pain (right), hearing loss (chronic b/l ), sore throat and tinnitus.        Wears dentures  Eyes: Negative for blurred vision.  Respiratory: Negative for cough, hemoptysis, shortness of breath and wheezing.   Cardiovascular: Negative for chest pain and leg swelling.  Gastrointestinal: Positive for heartburn. Negative for nausea, vomiting, abdominal pain, diarrhea and constipation.       Dysphagia for 3 months  Genitourinary: Negative for dysuria, urgency and frequency.  Musculoskeletal: Positive for neck pain (right side).  Neurological: Positive for headaches. Negative for dizziness.    Objective:  Physical Exam: Filed Vitals:   02/03/15 1408  BP: 113/65  Pulse: 89  Temp: 98.5 F (36.9 C)  TempSrc: Oral  Weight: 212 lb 12.8 oz (96.525 kg)  SpO2: 100%    Physical Exam  Constitutional: She is oriented to person, place, and time. She appears well-developed and well-nourished. No distress.  HENT:  Head: Normocephalic and atraumatic.  Right Ear: External ear normal.  Left Ear: External ear normal.  Nose: Nose normal.  Mouth/Throat: Oropharynx is clear and moist. No oropharyngeal exudate.  Due to trismus unable to fully evaluate posterior pharynx  Eyes: Conjunctivae and EOM are normal. Pupils are equal, round, and reactive to light. Right eye exhibits no discharge. Left eye exhibits no discharge. No scleral icterus.  Neck: Normal range of motion. Neck supple. No tracheal deviation present.  Right sided submandibular/neck mass approx 1 inch in  diameter  Pulmonary/Chest: Effort normal and breath sounds normal. No stridor. No respiratory distress. She has no wheezes. She has no rales.  Abdominal: Soft. Bowel sounds are normal. She exhibits no distension. There is no tenderness. There is no rebound and no guarding.  Musculoskeletal: Normal range of motion. She exhibits no edema or tenderness.  Lymphadenopathy:    She has no cervical adenopathy.  Neurological: She is alert and oriented to person, place, and time.  Skin: Skin is warm and dry. No rash noted. She is not diaphoretic. No erythema. No pallor.  Psychiatric: She has a normal mood and affect. Her behavior is normal. Judgment and thought content normal.    Assessment & Plan:   Please see problem list for problem-based assessment and plan

## 2015-02-03 NOTE — Assessment & Plan Note (Addendum)
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   Plan:  -Pt to be referred to either alternative ENT office in [facility] or [facility] within the next day for further work-up including tissue biopsy (possible panendoscopy) and staging with CT  -Discussed case with hematologist-oncologist Dr. Beryle Beams who is to discuss case with ENT Dr. Constance Holster  -Prescribe lidocaine 2% solution 20 mL as needed for odynophagia  -Prescribe carafate 1 g suspension QID for odynophagia  -Continue tramadol 50 mg Q 8 hr PRN odynophagia  -Consider oxycodone 5-325 mg PRN odynophagia if no response to above

## 2015-02-04 ENCOUNTER — Encounter: Payer: Self-pay | Admitting: Oncology

## 2015-02-04 ENCOUNTER — Telehealth: Payer: Self-pay | Admitting: *Deleted

## 2015-02-04 NOTE — Telephone Encounter (Signed)
Yes this was sent---I changed the magic mouth to viscous lidocaine instead per Dr Azucena Freed recommendations,  thanks!  Dr. Naaman Plummer

## 2015-02-04 NOTE — Progress Notes (Signed)
Medicine attending: Medical history, presenting problems, physical findings, and medications, reviewed with Dr Juluis Mire on the day of the patient visit and I concur with her evaluation and management plan. This patient was treated with chemotherapy and radiation for a head and neck cancer at age 49. She presented in May, 2016 with progressive dysphagia and a palpable lump on the right neck. She was seen by ENT surgeon Dr Arville Care by her history - records not available at time of this dictation. 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  I have personally reviewed these films. There is no indication on the report that it was forwarded to the ordering physician. The patient now has progressive symptoms and needs immediate attention which we will arrange. We will expedite an ENT referral and a Medical Oncology referral. We will order a CT of the chest and neck.

## 2015-02-04 NOTE — Progress Notes (Unsigned)
Patient ID: Stacy Anderson, female   DOB: 21-Mar-1966, 49 y.o.   MRN: [id number] This patient was seen in [place] yesterday. Remote ENT cancer Rxd at age 6 w chemo/RT. Details not available.Presented May, 2016 with dysphagia. CT neck 5/20 read as new or recurrent R tonsillar sulcus cancer. She was seen by Dr Izora Gala, ENT. I spoke with Dr Constance Holster this AM. Thorough exam with direct laryngoscopy did not reveal obvious recurrent Ca. A 6 week interval follow up was scheduled but patient failed to report. (Suspect insurance issues). She now presents with progressive dysphagia/odynophagia; trismus; right submandibular node/mass palpated by resident. I did not personally examine this patient. She will need to be restaged. Dr Constance Holster will call the patient in for follow up. Referral also made to Dr Heath Lark, Medical Oncology who will also evaluate the patient.

## 2015-02-04 NOTE — Progress Notes (Signed)
Thanks you Dr Alvy Bimler!

## 2015-02-04 NOTE — Telephone Encounter (Signed)
  Oncology Nurse Navigator Documentation   Navigator Encounter Type: Telephone (02/04/15 1156)      Confirmed with [facility] E[facility], that patient has appt with Dr. Constance Holster on Monday, 02/08/15.  Gayleen Orem, RN, BSN, [facility] [place] [phone]

## 2015-02-08 ENCOUNTER — Ambulatory Visit: Payer: Self-pay | Admitting: Internal Medicine

## 2015-02-09 ENCOUNTER — Ambulatory Visit: Payer: Self-pay | Admitting: Otolaryngology

## 2015-02-09 ENCOUNTER — Encounter (HOSPITAL_COMMUNITY): Payer: Self-pay | Admitting: *Deleted

## 2015-02-09 NOTE — Progress Notes (Signed)
Called Dr. Janeice Robinson office to request pre-op orders, spoke with Ivin Booty

## 2015-02-09 NOTE — H&P (Signed)
  Assessment  Cervical lymphadenopathy (785.6) (R59.0). History of nasopharyngeal cancer (V10.02) (Z85.819). Orders  Hydrocodone-Acetaminophen 7.5-325 MG Oral Tablet;TAKE 1 TABLET EVERY 4 TO 6 HOURS AS NEEDED FOR PAIN; Qty30; R0; Rx. Discussed  Here for followup. Complaining of sore throat on the right. There is a palpable node in level II on the right that is tender. Oral cavity and pharynx reveals edentulous, no mucosal lesions identified down to the larynx. The cords moved well. On fiberoptic exam, there is a possible mass in the right glossopharyngeal fold area. Recommend direct laryngoscopy and biopsy under anesthesia as soon as possible. Reason For Visit  Cervical lympadenopathy. Allergies  Aspirin TABS. Current Meds  Lidocaine Viscous SOLN;; RPT Lisinopril 20 MG Oral Tablet;; RPT Omeprazole 20 MG Oral Capsule Delayed Release;; RPT. Active Problems  Bilateral hearing loss   (389.9) (H91.93) Cervical lymphadenopathy   (785.6) (R59.0) History of nasopharyngeal cancer   (V10.02) (Z85.819) Hypertension   (401.9) (I10) Perforation of right tympanic membrane   (384.20) (H72.91) Sore throat   (462) (J02.9). Family Hx  Family history of diabetes mellitus: Father (V18.0) (Z83.3) Family history of osteoporosis: Father (V40.81) (Z59.62) Family history of transient ischemic attacks: Mother (V17.1) (Z82.3). Personal Hx  Current smoker (305.1) (F17.200) Daily caffeine consumption, 2-3 servings a day Social alcohol use (Z78.9). Procedure  Fiberoptic Laryngoscopy Name: Stacy Anderson     Age: 49 year     The risks and benefits of this procedure have been thoroughly discussed with the patient/parent.  The most commons risks outlined included but were not limited to: injury  to the nasal mucosa or throat irritation.  The patient/parent was further informed that there are other less common risks.  The patient/parent was given the opportunity to ask questions and all such questions were answered  to the patient/parent's satisfaction.  Patient/parent acknowledged the risks and has agreed to proceed.   Performing Provider: Izora Gala The risks of the procedure are minimal and were discussed with the patient today. Pre-op Diagnosis: sore throat, possible pharyngeal mass Post-op Diagnosis:Same Allergy:  reviewed allergies as listed Nasal Prep:Lidocaine/Afrin   Procedure:     With the patient seated in the exam chair, the R nasal cavity was intubated with the flexible laryngoscope.  The nasal cavity mucosa, nasopharynx, hypopharynx and larynx were all examined with findings as noted in the body of today's office note.  The scope was then removed.  The patient tolerated the procedure well without complication or blood loss (unless indicated in findings).   FINDINGS: . Signature  Electronically signed by : Izora Gala  M.D.; 02/08/2015 2:55 PM EST.

## 2015-02-09 NOTE — Progress Notes (Signed)
Anesthesia Chart Review:  Pt is 49 year old female scheduled for direct laryngoscopy with biopsy on 02/10/2015 with Dr. Elie Goody.   Pt is a same day work up.   PMH includes: HTN, L carotid artery occlusion (by CT neck 11/06/14, MR neck 01/10/07), anemia, nasal cancer, bilateral hearing loss. Former smoker. BMI 32.   Medications include: lisinopril, prilosec.   CBC, BMET will be obtained DOS.   EKG will be obtained DOS.   Echo 05/26/2010: -Left ventricle: The cavity size was normal. There was disproportionate septal hypertrophywithout obstruction. Systolicfunction was normal. The estimated ejection fraction was in therange of 60% to 65%. Wall motion was normal; there were no regional wall motion abnormalities. Doppler parameters are consistent with abnormal left ventricular relaxation (grade 1 diastolic dysfunction).    MRA of neck 01/10/07 showed occlusion of L ICA, 55-60% stenosis proximal right ICA 13 mm distal to its origin at a site of vessel tortuosity and kinking.  CT of neck 11/06/14 showed occlusion of L ICA and noted Right-sided carotid calcifications  Reviewed with Dr. Deatra Canter. Pt will need further assessment by assigned anesthesiologist DOS. In the absence of symptoms associated with carotid stenosis, I anticipate pt can proceed as scheduled.   Willeen Cass, FNP-BC [facility]/Anesthesiology Phone: [phone] 02/09/2015 2:03 PM

## 2015-02-10 ENCOUNTER — Encounter (HOSPITAL_COMMUNITY)
Admission: RE | Disposition: A | Discharge status: Home / Self Care | Payer: Self-pay | Source: Ambulatory Visit | Attending: Otolaryngology

## 2015-02-10 ENCOUNTER — Ambulatory Visit (HOSPITAL_COMMUNITY)
Admission: RE | Admit: 2015-02-10 | Discharge: 2015-02-10 | Disposition: A | Discharge status: Home / Self Care | Payer: Self-pay | Source: Ambulatory Visit | Attending: Otolaryngology | Admitting: Otolaryngology

## 2015-02-10 ENCOUNTER — Encounter (HOSPITAL_COMMUNITY): Payer: Self-pay | Admitting: General Practice

## 2015-02-10 ENCOUNTER — Ambulatory Visit (HOSPITAL_COMMUNITY): Payer: Self-pay | Admitting: Emergency Medicine

## 2015-02-10 DIAGNOSIS — F17200 Nicotine dependence, unspecified, uncomplicated: Secondary | ICD-10-CM | POA: Insufficient documentation

## 2015-02-10 DIAGNOSIS — I10 Essential (primary) hypertension: Secondary | ICD-10-CM | POA: Insufficient documentation

## 2015-02-10 DIAGNOSIS — Z85819 Personal history of malignant neoplasm of unspecified site of lip, oral cavity, and pharynx: Secondary | ICD-10-CM | POA: Insufficient documentation

## 2015-02-10 DIAGNOSIS — C111 Malignant neoplasm of posterior wall of nasopharynx: Secondary | ICD-10-CM | POA: Insufficient documentation

## 2015-02-10 DIAGNOSIS — K219 Gastro-esophageal reflux disease without esophagitis: Secondary | ICD-10-CM | POA: Insufficient documentation

## 2015-02-10 HISTORY — DX: Anemia, unspecified: D64.9

## 2015-02-10 HISTORY — DX: Gastro-esophageal reflux disease without esophagitis: K21.9

## 2015-02-10 HISTORY — PX: DIRECT LARYNGOSCOPY: SHX5326

## 2015-02-10 LAB — BASIC METABOLIC PANEL
ANION GAP: 10 (ref 5–15)
BUN: 22 mg/dL — ABNORMAL HIGH (ref 6–20)
CO2: 20 mmol/L — AB (ref 22–32)
Calcium: 9.2 mg/dL (ref 8.9–10.3)
Chloride: 108 mmol/L (ref 101–111)
Creatinine, Ser: 0.77 mg/dL (ref 0.44–1.00)
GLUCOSE: 98 mg/dL (ref 65–99)
POTASSIUM: 3.9 mmol/L (ref 3.5–5.1)
Sodium: 138 mmol/L (ref 135–145)

## 2015-02-10 LAB — CBC
HEMATOCRIT: 39.9 % (ref 36.0–46.0)
Hemoglobin: 13.5 g/dL (ref 12.0–15.0)
MCH: 34.1 pg — ABNORMAL HIGH (ref 26.0–34.0)
MCHC: 33.8 g/dL (ref 30.0–36.0)
MCV: 100.8 fL — AB (ref 78.0–100.0)
Platelets: 364 10*3/uL (ref 150–400)
RBC: 3.96 MIL/uL (ref 3.87–5.11)
RDW: 12.6 % (ref 11.5–15.5)
WBC: 15 10*3/uL — AB (ref 4.0–10.5)

## 2015-02-10 SURGERY — LARYNGOSCOPY, DIRECT
Anesthesia: General | Site: Throat

## 2015-02-10 MED ORDER — EPINEPHRINE HCL (NASAL) 0.1 % NA SOLN
NASAL | Status: DC | PRN
  Administered 2015-02-10: 30 mL via TOPICAL

## 2015-02-10 MED ORDER — 0.9 % SODIUM CHLORIDE (POUR BTL) OPTIME
TOPICAL | Status: DC | PRN
  Administered 2015-02-10: 1000 mL

## 2015-02-10 MED ORDER — FENTANYL CITRATE (PF) 100 MCG/2ML IJ SOLN
INTRAMUSCULAR | Status: DC | PRN
  Administered 2015-02-10: 50 ug via INTRAVENOUS
  Administered 2015-02-10: 100 ug via INTRAVENOUS

## 2015-02-10 MED ORDER — ONDANSETRON HCL 4 MG/2ML IJ SOLN
INTRAMUSCULAR | Status: DC | PRN
  Administered 2015-02-10: 4 mg via INTRAVENOUS

## 2015-02-10 MED ORDER — LACTATED RINGERS IV SOLN
INTRAVENOUS | Status: DC
  Administered 2015-02-10 (×2): via INTRAVENOUS

## 2015-02-10 MED ORDER — FENTANYL CITRATE (PF) 250 MCG/5ML IJ SOLN
INTRAMUSCULAR | Status: AC
  Filled 2015-02-10: qty 5

## 2015-02-10 MED ORDER — FENTANYL CITRATE (PF) 100 MCG/2ML IJ SOLN
25.0000 ug | INTRAMUSCULAR | Status: DC | PRN
  Administered 2015-02-10 (×2): 50 ug via INTRAVENOUS

## 2015-02-10 MED ORDER — EPINEPHRINE HCL (NASAL) 0.1 % NA SOLN
NASAL | Status: AC
  Filled 2015-02-10: qty 30

## 2015-02-10 MED ORDER — LIDOCAINE-EPINEPHRINE 1 %-1:100000 IJ SOLN
INTRAMUSCULAR | Status: AC
  Filled 2015-02-10: qty 1

## 2015-02-10 MED ORDER — LIDOCAINE HCL (CARDIAC) 20 MG/ML IV SOLN
INTRAVENOUS | Status: DC | PRN
  Administered 2015-02-10: 60 mg via INTRAVENOUS

## 2015-02-10 MED ORDER — PROPOFOL 10 MG/ML IV BOLUS
INTRAVENOUS | Status: DC | PRN
  Administered 2015-02-10: 140 mg via INTRAVENOUS

## 2015-02-10 MED ORDER — SUCCINYLCHOLINE CHLORIDE 20 MG/ML IJ SOLN
INTRAMUSCULAR | Status: DC | PRN
  Administered 2015-02-10: 100 mg via INTRAVENOUS

## 2015-02-10 MED ORDER — HYDRALAZINE HCL 20 MG/ML IJ SOLN
INTRAMUSCULAR | Status: AC
  Filled 2015-02-10: qty 1

## 2015-02-10 MED ORDER — FENTANYL CITRATE (PF) 100 MCG/2ML IJ SOLN
INTRAMUSCULAR | Status: DC
Start: 2015-02-10 — End: 2015-02-10
  Filled 2015-02-10: qty 2

## 2015-02-10 MED ORDER — HYDRALAZINE HCL 20 MG/ML IJ SOLN
5.0000 mg | INTRAMUSCULAR | Status: AC
  Administered 2015-02-10 (×2): 5 mg via INTRAVENOUS

## 2015-02-10 MED ORDER — DEXAMETHASONE SODIUM PHOSPHATE 4 MG/ML IJ SOLN
INTRAMUSCULAR | Status: DC | PRN
  Administered 2015-02-10: 4 mg via INTRAVENOUS

## 2015-02-10 MED ORDER — MIDAZOLAM HCL 5 MG/5ML IJ SOLN
INTRAMUSCULAR | Status: DC | PRN
  Administered 2015-02-10: 2 mg via INTRAVENOUS

## 2015-02-10 MED ORDER — MIDAZOLAM HCL 2 MG/2ML IJ SOLN
INTRAMUSCULAR | Status: AC
  Filled 2015-02-10: qty 2

## 2015-02-10 MED ORDER — LIDOCAINE-EPINEPHRINE 1 %-1:100000 IJ SOLN: INTRAMUSCULAR | Status: DC | PRN

## 2015-02-10 SURGICAL SUPPLY — 23 items
CANISTER SUCTION 2500CC (MISCELLANEOUS) ×2 IMPLANT
CONT SPEC 4OZ CLIKSEAL STRL BL (MISCELLANEOUS) ×2 IMPLANT
COVER MAYO STAND STRL (DRAPES) ×2 IMPLANT
COVER TABLE BACK 60X90 (DRAPES) ×2 IMPLANT
DRAPE PROXIMA HALF (DRAPES) ×2 IMPLANT
GAUZE SPONGE 4X4 16PLY XRAY LF (GAUZE/BANDAGES/DRESSINGS) ×2 IMPLANT
GLOVE BIOGEL PI IND STRL 7.0 (GLOVE) ×2 IMPLANT
GLOVE BIOGEL PI INDICATOR 7.0 (GLOVE) ×2
GLOVE ECLIPSE 6.5 STRL STRAW (GLOVE) ×2 IMPLANT
GLOVE ECLIPSE 7.5 STRL STRAW (GLOVE) ×2 IMPLANT
GOWN STRL REUS W/TWL MED LVL3 (GOWN DISPOSABLE) ×4 IMPLANT
GUARD TEETH (MISCELLANEOUS) IMPLANT
KIT BASIN OR (CUSTOM PROCEDURE TRAY) ×2 IMPLANT
KIT ROOM TURNOVER OR (KITS) ×2 IMPLANT
LIDOCAINE 1% WITH EPI 1:100,000, 20 ML IMPLANT
NS IRRIG 1000ML POUR BTL (IV SOLUTION) ×2 IMPLANT
PAD ARMBOARD 7.5X6 YLW CONV (MISCELLANEOUS) ×4 IMPLANT
PATTIES SURGICAL .5 X3 (DISPOSABLE) ×2 IMPLANT
SOLUTION ANTI FOG 6CC (MISCELLANEOUS) ×2 IMPLANT
SYR CONTROL 10ML LL (SYRINGE) IMPLANT
TOWEL OR 17X24 6PK STRL BLUE (TOWEL DISPOSABLE) ×2 IMPLANT
TOWEL OR 17X26 10 PK STRL BLUE (TOWEL DISPOSABLE) ×2 IMPLANT
TUBE CONNECTING 12X1/4 (SUCTIONS) ×2 IMPLANT

## 2015-02-10 NOTE — Anesthesia Procedure Notes (Signed)
Procedure Name: Intubation Date/Time: 02/10/2015 12:49 PM Performed by: Trixie Deis A Pre-anesthesia Checklist: Patient identified, Timeout performed, Emergency Drugs available, Suction available and Patient being monitored Patient Re-evaluated:Patient Re-evaluated prior to inductionOxygen Delivery Method: Circle system utilized Preoxygenation: Pre-oxygenation with 100% oxygen Intubation Type: IV induction Ventilation: Mask ventilation without difficulty Grade View: Grade I Tube type: Oral Tube size: 7.0 mm Number of attempts: 1 Airway Equipment and Method: Rigid stylet and Video-laryngoscopy Placement Confirmation: ETT inserted through vocal cords under direct vision,  breath sounds checked- equal and bilateral and positive ETCO2 Secured at: 21 cm Tube secured with: Tape Dental Injury: Teeth and Oropharynx as per pre-operative assessment

## 2015-02-10 NOTE — Interval H&P Note (Signed)
History and Physical Interval Note:  02/10/2015 12:15 PM  Stacy Anderson  has presented today for surgery, with the diagnosis of CERVICAL LYMPHADENOPAPHY  The various methods of treatment have been discussed with the patient and family. After consideration of risks, benefits and other options for treatment, the patient has consented to  Procedure(s): DIRECT LARYNGOSCOPY WITH BIOPSY (N/A) as a surgical intervention .  The patient's history has been reviewed, patient examined, no change in status, stable for surgery.  I have reviewed the patient's chart and labs.  Questions were answered to the patient's satisfaction.     Perley Arthurs

## 2015-02-10 NOTE — Transfer of Care (Signed)
Immediate Anesthesia Transfer of Care Note  Patient: Stacy Anderson  Procedure(s) Performed: Procedure(s): DIRECT LARYNGOSCOPY WITH BIOPSY (N/A)  Patient Location: PACU  Anesthesia Type:General  Level of Consciousness: awake, alert  and oriented  Airway & Oxygen Therapy: Patient Spontanous Breathing and Patient connected to nasal cannula oxygen  Post-op Assessment: Report given to RN and Post -op Vital signs reviewed and stable  Post vital signs: Reviewed and stable  Last Vitals:  Filed Vitals:   02/10/15 1205  BP: 109/78  Pulse: 82  Temp: 38.3 C    Complications: No apparent anesthesia complications

## 2015-02-10 NOTE — Anesthesia Postprocedure Evaluation (Signed)
  Anesthesia Post-op Note  Patient: Stacy Anderson  Procedure(s) Performed: Procedure(s): DIRECT LARYNGOSCOPY WITH BIOPSY (N/A)  Patient Location: PACU  Anesthesia Type: General   Level of Consciousness: awake, alert  and oriented  Airway and Oxygen Therapy: Patient Spontanous Breathing  Post-op Pain: mild  Post-op Assessment: Post-op Vital signs reviewed  Post-op Vital Signs: Reviewed  Last Vitals:  Filed Vitals:   02/10/15 1512  BP: 145/94  Pulse: 82  Temp:   Resp:     Complications: No apparent anesthesia complications

## 2015-02-10 NOTE — Op Note (Signed)
OPERATIVE REPORT  DATE OF SURGERY: 02/10/2015  PATIENT:  Stacy Anderson,  49 y.o. female  PRE-OPERATIVE DIAGNOSIS:  CERVICAL LYMPHADENOPAPHY  POST-OPERATIVE DIAGNOSIS:  CERVICAL LYMPHADENOPAPHY  PROCEDURE:  Procedure(s): DIRECT LARYNGOSCOPY WITH BIOPSY  SURGEON:  Beckie Salts, MD  ASSISTANTS: None  ANESTHESIA:   General   EBL:  20 ml  DRAINS: None  LOCAL MEDICATIONS USED:  None  SPECIMEN:  Right oropharyngeal biopsy  COUNTS:  Correct  PROCEDURE DETAILS: The patient was taken to the operating room and placed on the operating table in the supine position. Following induction of general endotracheal anesthesia, the table was turned 90 and the patient was draped in a standard fashion. A rigid laryngoscope was entered into the oral cavity used to visualize the pharynx and larynx. The only abnormal finding was a friable mass involving the right tonsil and right glosso-pharyngeal fold. Multiple biopsies were taken from this mass. Topical adrenaline was applied on pledgets. Palpation of the mass revealed it seemed to involve the entire tonsil and onto the lateral base of tongue. It did not approach the midline. It did not extend above the level of the palatine tonsil. Contralateral side was free of any palpable lesion. Scope was removed and the patient was awakened extubated and transferred to recovery in stable condition.    PATIENT DISPOSITION:  To PACU, stable

## 2015-02-10 NOTE — H&P (View-Only) (Signed)
  Assessment  Cervical lymphadenopathy (785.6) (R59.0). History of nasopharyngeal cancer (V10.02) (Z85.819). Orders  Hydrocodone-Acetaminophen 7.5-325 MG Oral Tablet;TAKE 1 TABLET EVERY 4 TO 6 HOURS AS NEEDED FOR PAIN; Qty30; R0; Rx. Discussed  Here for followup. Complaining of sore throat on the right. There is a palpable node in level II on the right that is tender. Oral cavity and pharynx reveals edentulous, no mucosal lesions identified down to the larynx. The cords moved well. On fiberoptic exam, there is a possible mass in the right glossopharyngeal fold area. Recommend direct laryngoscopy and biopsy under anesthesia as soon as possible. Reason For Visit  Cervical lympadenopathy. Allergies  Aspirin TABS. Current Meds  Lidocaine Viscous SOLN;; RPT Lisinopril 20 MG Oral Tablet;; RPT Omeprazole 20 MG Oral Capsule Delayed Release;; RPT. Active Problems  Bilateral hearing loss   (389.9) (H91.93) Cervical lymphadenopathy   (785.6) (R59.0) History of nasopharyngeal cancer   (V10.02) (Z85.819) Hypertension   (401.9) (I10) Perforation of right tympanic membrane   (384.20) (H72.91) Sore throat   (462) (J02.9). Family Hx  Family history of diabetes mellitus: Father (V18.0) (Z83.3) Family history of osteoporosis: Father (V61.81) (Z76.62) Family history of transient ischemic attacks: Mother (V17.1) (Z82.3). Personal Hx  Current smoker (305.1) (F17.200) Daily caffeine consumption, 2-3 servings a day Social alcohol use (Z78.9). Procedure  Fiberoptic Laryngoscopy Name: Annisha Baar     Age: 49 year     The risks and benefits of this procedure have been thoroughly discussed with the patient/parent.  The most commons risks outlined included but were not limited to: injury  to the nasal mucosa or throat irritation.  The patient/parent was further informed that there are other less common risks.  The patient/parent was given the opportunity to ask questions and all such questions were answered  to the patient/parent's satisfaction.  Patient/parent acknowledged the risks and has agreed to proceed.   Performing Provider: Izora Gala The risks of the procedure are minimal and were discussed with the patient today. Pre-op Diagnosis: sore throat, possible pharyngeal mass Post-op Diagnosis:Same Allergy:  reviewed allergies as listed Nasal Prep:Lidocaine/Afrin   Procedure:     With the patient seated in the exam chair, the R nasal cavity was intubated with the flexible laryngoscope.  The nasal cavity mucosa, nasopharynx, hypopharynx and larynx were all examined with findings as noted in the body of today's office note.  The scope was then removed.  The patient tolerated the procedure well without complication or blood loss (unless indicated in findings).   FINDINGS: . Signature  Electronically signed by : Izora Gala  M.D.; 02/08/2015 2:55 PM EST.

## 2015-02-10 NOTE — Anesthesia Preprocedure Evaluation (Signed)
Anesthesia Evaluation  Patient identified by MRN, date of birth, ID band Patient awake    Reviewed: Allergy & Precautions, H&P , NPO status , Patient's Chart, lab work & pertinent test results  Airway Mallampati: III  TM Distance: >3 FB Neck ROM: Full    Dental no notable dental hx. (+) Edentulous Upper, Edentulous Lower, Dental Advisory Given   Pulmonary neg pulmonary ROS, former smoker,  breath sounds clear to auscultation  Pulmonary exam normal       Cardiovascular hypertension, Pt. on medications Rhythm:Regular Rate:Normal     Neuro/Psych negative neurological ROS  negative psych ROS   GI/Hepatic Neg liver ROS, GERD-  Medicated and Controlled,  Endo/Other  negative endocrine ROS  Renal/GU negative Renal ROS  negative genitourinary   Musculoskeletal   Abdominal   Peds  Hematology negative hematology ROS (+)   Anesthesia Other Findings   Reproductive/Obstetrics negative OB ROS                             Anesthesia Physical Anesthesia Plan  ASA: III  Anesthesia Plan: General   Post-op Pain Management:    Induction: Intravenous  Airway Management Planned: Oral ETT and Video Laryngoscope Planned  Additional Equipment:   Intra-op Plan:   Post-operative Plan: Extubation in OR  Informed Consent: I have reviewed the patients History and Physical, chart, labs and discussed the procedure including the risks, benefits and alternatives for the proposed anesthesia with the patient or authorized representative who has indicated his/her understanding and acceptance.   Dental advisory given  Plan Discussed with: CRNA  Anesthesia Plan Comments:         Anesthesia Quick Evaluation

## 2015-02-11 ENCOUNTER — Encounter (HOSPITAL_COMMUNITY): Payer: Self-pay | Admitting: Otolaryngology

## 2015-03-03 DIAGNOSIS — C01 Malignant neoplasm of base of tongue: Secondary | ICD-10-CM | POA: Insufficient documentation

## 2015-04-15 ENCOUNTER — Emergency Department (HOSPITAL_COMMUNITY): Payer: Self-pay

## 2015-04-15 ENCOUNTER — Encounter (HOSPITAL_COMMUNITY): Payer: Self-pay | Admitting: *Deleted

## 2015-04-15 ENCOUNTER — Emergency Department (HOSPITAL_COMMUNITY)
Admission: EM | Admit: 2015-04-15 | Discharge: 2015-04-15 | Disposition: A | Discharge status: Home / Self Care | Payer: Self-pay | Attending: Emergency Medicine | Admitting: Emergency Medicine

## 2015-04-15 DIAGNOSIS — Z87891 Personal history of nicotine dependence: Secondary | ICD-10-CM | POA: Insufficient documentation

## 2015-04-15 DIAGNOSIS — Z79899 Other long term (current) drug therapy: Secondary | ICD-10-CM | POA: Insufficient documentation

## 2015-04-15 DIAGNOSIS — Z8619 Personal history of other infectious and parasitic diseases: Secondary | ICD-10-CM | POA: Insufficient documentation

## 2015-04-15 DIAGNOSIS — Z8522 Personal history of malignant neoplasm of nasal cavities, middle ear, and accessory sinuses: Secondary | ICD-10-CM | POA: Insufficient documentation

## 2015-04-15 DIAGNOSIS — F419 Anxiety disorder, unspecified: Secondary | ICD-10-CM | POA: Insufficient documentation

## 2015-04-15 DIAGNOSIS — Z8669 Personal history of other diseases of the nervous system and sense organs: Secondary | ICD-10-CM | POA: Insufficient documentation

## 2015-04-15 DIAGNOSIS — E860 Dehydration: Secondary | ICD-10-CM | POA: Insufficient documentation

## 2015-04-15 DIAGNOSIS — K219 Gastro-esophageal reflux disease without esophagitis: Secondary | ICD-10-CM | POA: Insufficient documentation

## 2015-04-15 DIAGNOSIS — D649 Anemia, unspecified: Secondary | ICD-10-CM | POA: Insufficient documentation

## 2015-04-15 DIAGNOSIS — I10 Essential (primary) hypertension: Secondary | ICD-10-CM | POA: Insufficient documentation

## 2015-04-15 LAB — CBC WITH DIFFERENTIAL/PLATELET
BASOS ABS: 0 10*3/uL (ref 0.0–0.1)
BASOS PCT: 0 %
EOS PCT: 1 %
Eosinophils Absolute: 0.2 10*3/uL (ref 0.0–0.7)
HCT: 31.7 % — ABNORMAL LOW (ref 36.0–46.0)
Hemoglobin: 10.6 g/dL — ABNORMAL LOW (ref 12.0–15.0)
Lymphocytes Relative: 16 %
Lymphs Abs: 3.1 10*3/uL (ref 0.7–4.0)
MCH: 33.4 pg (ref 26.0–34.0)
MCHC: 33.4 g/dL (ref 30.0–36.0)
MCV: 100 fL (ref 78.0–100.0)
MONO ABS: 1 10*3/uL (ref 0.1–1.0)
Monocytes Relative: 5 %
NEUTROS PCT: 78 %
Neutro Abs: 14.4 10*3/uL — ABNORMAL HIGH (ref 1.7–7.7)
PLATELETS: 764 10*3/uL — AB (ref 150–400)
RBC: 3.17 MIL/uL — ABNORMAL LOW (ref 3.87–5.11)
RDW: 12.2 % (ref 11.5–15.5)
WBC: 18.7 10*3/uL — AB (ref 4.0–10.5)

## 2015-04-15 LAB — URINALYSIS, ROUTINE W REFLEX MICROSCOPIC
Bilirubin Urine: NEGATIVE
GLUCOSE, UA: NEGATIVE mg/dL
Hgb urine dipstick: NEGATIVE
Ketones, ur: NEGATIVE mg/dL
LEUKOCYTES UA: NEGATIVE
NITRITE: NEGATIVE
PROTEIN: NEGATIVE mg/dL
Specific Gravity, Urine: 1.011 (ref 1.005–1.030)
Urobilinogen, UA: 0.2 mg/dL (ref 0.0–1.0)
pH: 8 (ref 5.0–8.0)

## 2015-04-15 LAB — COMPREHENSIVE METABOLIC PANEL
ALBUMIN: 2.9 g/dL — AB (ref 3.5–5.0)
ALT: 13 U/L — ABNORMAL LOW (ref 14–54)
AST: 16 U/L (ref 15–41)
Alkaline Phosphatase: 83 U/L (ref 38–126)
Anion gap: 12 (ref 5–15)
BUN: 22 mg/dL — AB (ref 6–20)
CHLORIDE: 99 mmol/L — AB (ref 101–111)
CO2: 26 mmol/L (ref 22–32)
Calcium: 9.8 mg/dL (ref 8.9–10.3)
Creatinine, Ser: 1.05 mg/dL — ABNORMAL HIGH (ref 0.44–1.00)
GFR calc Af Amer: >60 mL/min (ref >60)
Glucose, Bld: 86 mg/dL (ref 65–99)
POTASSIUM: 4.5 mmol/L (ref 3.5–5.1)
Sodium: 137 mmol/L (ref 135–145)
Total Bilirubin: 0.6 mg/dL (ref 0.3–1.2)
Total Protein: 7.2 g/dL (ref 6.5–8.1)

## 2015-04-15 LAB — POC OCCULT BLOOD, ED: FECAL OCCULT BLD: NEGATIVE

## 2015-04-15 MED ORDER — SODIUM CHLORIDE 0.9 % IV BOLUS (SEPSIS)
1000.0000 mL | Freq: Once | INTRAVENOUS | Status: AC
  Administered 2015-04-15: 1000 mL via INTRAVENOUS

## 2015-04-15 MED ORDER — LORAZEPAM 2 MG/ML IJ SOLN
0.5000 mg | Freq: Once | INTRAMUSCULAR | Status: AC
  Administered 2015-04-15: 0.5 mg via INTRAVENOUS
  Filled 2015-04-15: qty 1

## 2015-04-15 MED ORDER — BOOST / RESOURCE BREEZE PO LIQD
1.0000 | Freq: Once | ORAL | Status: DC
  Filled 2015-04-15: qty 1

## 2015-04-15 MED ORDER — SODIUM CHLORIDE 0.9 % IV SOLN: INTRAVENOUS | Status: DC

## 2015-04-15 NOTE — Discharge Instructions (Signed)

## 2015-04-15 NOTE — ED Notes (Signed)
Attempted IV X2 with no success, EDP to place US guided IV

## 2015-04-15 NOTE — ED Provider Notes (Signed)
CSN: [id number]     Arrival date & time 04/15/15  1158 History   First MD Initiated Contact with Patient 04/15/15 1231     Chief Complaint  Patient presents with  . Numbness     (Consider location/radiation/quality/duration/timing/severity/associated sxs/prior Treatment) HPI  PCP: Burgess Estelle, MD  Stacy Anderson is a 49 y.o.  female  PMH: Hypertension, since oh neural hearing loss of both ears, left carotid artery stenosis, lung malignant neoplasm of nasal cavities, acid reflux, anemia, recent surgical procedure on Apr 01, 2015 to remove tumor from neck.  The patient is supposed to be drinking 3 cups a day of water through her nasogastric tube but has been forgetting to do this. She  Started to feel poorly and feeling weak and became very nervous, followed by hyperventilating and then numbness and tingling to her bilateral hands and feet.  Not having any weakness, facial droop, confusion, LOC or slurring of speech.  Pt is tearful and anxious but alert and oriented x 3. Dr. Colin Rhein has seen patient prior to my initial exam.  ROS: The patient denies diaphoresis, fever, headache, weakness (general or focal), confusion, change of vision,  dysphagia, aphagia, shortness of breath,  abdominal pains, nausea, vomiting, diarrhea, lower extremity swelling, rash, neck pain, chest pain   Past Medical History  Diagnosis Date  . Hypertension   . Sensorineural hearing loss of both ears     likely secondary to the radiation therapy, chemotherapy and the vascular flow issues  . Left carotid artery stenosis   . Herpes genitalis 12/30/2008  . 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 Unspecified hearing loss 05/05/2010    Annotation: bil hearing aids since age 57 - nasal cancer Qualifier: Diagnosis of  By: Hassell Done FNP, Tori Milks    . 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 GERD (gastroesophageal reflux disease)   . Anemia    Past Surgical History  Procedure Laterality Date  . Tubal ligation Bilateral 04/03/2003    Dr. Lorriane Shire  . Exploratory tympanotomy Right 06/05/2007    Minna Merritts, M.D.  . Esophagogastroduodenoscopy N/A 11/01/2012    Procedure: ESOPHAGOGASTRODUODENOSCOPY (EGD);  Surgeon: Beryle Beams, MD;  Location: [facility] ENDOSCOPY;  Service: Endoscopy;  Laterality: N/A;  . Fracture surgery Left     hip  . Direct laryngoscopy N/A 02/10/2015    Procedure: DIRECT LARYNGOSCOPY WITH BIOPSY;  Surgeon: Izora Gala, MD;  Location: [facility] OR;  Service: ENT;  Laterality: N/A;   Family History  Problem Relation Age of Onset  . Hypertension Mother   . Heart disease Mother   . Diabetes Father   . Diabetes Sister   . Cancer Brother 13    Brain cancer    Social History  Substance Use Topics  . Smoking status: Former Smoker -- 0.25 packs/day for 20 years    Types: Cigarettes    Quit date: 09/07/2012  . Smokeless tobacco: Never Used  . Alcohol Use: No   OB History    No data available     Review of Systems  10 Systems reviewed and are negative for acute change except as noted in the HPI.   Allergies  Asa and Aleve  Home Medications   Prior to Admission medications   Medication Sig Start Date End Date Taking? Authorizing Provider  acetaminophen-codeine (TYLENOL #3)  300-30 MG per tablet Take 1-2 tablets by mouth every 8 (eight) hours as needed for moderate pain. 07/22/14  Yes Jessee Avers, MD  gabapentin (NEURONTIN) 250 MG/5ML solution Take 250 mg by mouth 3 (three) times daily.   Yes Historical Provider, MD  lidocaine (XYLOCAINE) 2 % solution Use as directed 20 mLs in the mouth or throat as needed for mouth pain. 02/03/15  Yes Marjan Rabbani, MD  lisinopril (PRINIVIL,ZESTRIL) 20 MG tablet Take 1 tablet (20 mg total) by mouth daily. 02/03/15  Yes Juluis Mire, MD  oxyCODONE (ROXICODONE) 5 MG/5ML solution Take 10-15 mg by  mouth every 3 (three) hours as needed for moderate pain or severe pain.   Yes Historical Provider, MD  pantoprazole sodium (PROTONIX) 40 mg/20 mL PACK Take 40 mg by mouth daily. 04/10/15  Yes Historical Provider, MD  omeprazole (PRILOSEC) 40 MG capsule Take 1 capsule (40 mg total) by mouth daily. Patient not taking: Reported on 04/15/2015 02/03/15   Juluis Mire, MD  sucralfate (CARAFATE) 1 GM/10ML suspension Take 10 mLs (1 g total) by mouth 4 (four) times daily. Patient not taking: Reported on 02/10/2015 02/03/15 02/03/16  Juluis Mire, MD  traMADol (ULTRAM) 50 MG tablet Take 1 tablet (50 mg total) by mouth every 8 (eight) hours as needed. Patient not taking: Reported on 02/10/2015 01/11/15 01/11/16  Burgess Estelle, MD   BP 113/80 mmHg  Pulse 87  Temp(Src) 99 F (37.2 C) (Oral)  Resp 18  SpO2 98%  LMP 03/31/2013 Physical Exam  Constitutional: She appears well-developed and well-nourished. No distress.  HENT:  Head: Normocephalic and atraumatic.  Eyes: Pupils are equal, round, and reactive to light.  Neck: Normal range of motion. Neck supple.  Wound bandages appear clean, no signs of induration or abnormal tenderness to neck due to recent surgery. Nasogastric tube is in place.  Cardiovascular: Normal rate and regular rhythm.   Pulmonary/Chest: Effort normal.  Abdominal: Soft.  Neurological: She is alert.  Cranial nerves II-VIII and X-XII evaluated and show no deficits. Pt alert and oriented x 3 Upper and lower extremity strength is symmetrical and physiologic Normal muscular tone No facial droop Coordination intact, no limb ataxia,  Rapid alternating movements normal No pronator drift  Skin: Skin is warm and dry.  Nursing note and vitals reviewed.   ED Course  Procedures (including critical care time) Labs Review Labs Reviewed  CBC WITH DIFFERENTIAL/PLATELET - Abnormal; Notable for the following:    WBC 18.7 (*)    RBC 3.17 (*)    Hemoglobin 10.6 (*)    HCT 31.7 (*)     Platelets 764 (*)    Neutro Abs 14.4 (*)    All other components within normal limits  COMPREHENSIVE METABOLIC PANEL - Abnormal; Notable for the following:    Chloride 99 (*)    BUN 22 (*)    Creatinine, Ser 1.05 (*)    Albumin 2.9 (*)    ALT 13 (*)    All other components within normal limits  URINALYSIS, ROUTINE W REFLEX MICROSCOPIC (NOT AT [facility])  POC OCCULT BLOOD, ED  POC OCCULT BLOOD, ED    Imaging Review Dg Chest 2 View  04/15/2015  CLINICAL DATA:  Numbness of the bilateral upper and lower extremities. EXAM: CHEST  2 VIEW COMPARISON:  11/08/2012 FINDINGS: Mild hypoventilation with subsegmental atelectasis bilaterally. Normal heart size and mediastinal contours. Surgical changes at the thoracic inlet. A feeding tube is coiled in the proximal stomach. IMPRESSION: Mild hypoventilation and atelectasis. Electronically Signed  By: Monte Fantasia M.D.   On: 04/15/2015 13:27   Ct Head Wo Contrast  04/15/2015  CLINICAL DATA:  Numbness of the bilateral upper and lower extremities. EXAM: CT HEAD WITHOUT CONTRAST TECHNIQUE: Contiguous axial images were obtained from the base of the skull through the vertex without intravenous contrast. 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 Chronic mastoiditis and sinusitis with chronic fluid levels in the maxillary sinuses. Chronic opacification of the left frontal sinus which has a more rounded appearance than in 2008 but is stable from 2014. No cortical breakthrough. No destructive process. Orbits: No acute abnormality. Brain: No evidence of acute infarction, hemorrhage, hydrocephalus, or mass lesion/mass effect. IMPRESSION: 1. No acute finding. 2. Chronic osseous radiation changes in the face, skullbase, and upper cervical spine. 3.  Chronic sinusitis with chronic complete left frontal sinus obstruction. Electronically Signed   By: Monte Fantasia M.D.   On: 04/15/2015 13:41   I have personally reviewed and evaluated these images and lab results as part of my medical decision-making.   EKG Interpretation None      MDM   Final diagnoses:  Anxiety  Anemia, unspecified anemia type  Dehydration   Dr. Colin Rhein has seen patient and recommends CT head and chest xray as well as urinalysis, cbc, and CMP. Due to difficulty obtaining IV, bedside US guided iV placed by Dr. Colin Rhein.  WBC is 18.7, she typically runs elevated. Her hemoglobin is 10.6, which is 3 points lower than 2 months ago. Will do hemoccult to r/o occult rectal bleed.    3: 30 pm I discussed status with patient, hemoglobin is low- denies noticing any bleeding and needing urine to check for UTI. Patient reports being very hungry and requesting a Boost drink. Urine is normal. Waiting for fluids to complete and hemoccult. She very much would prefer to go home if possible, her family members are comfortable with this. She has good outpatient follow-up and is continuing to feel better. Patient signed out to oncoming ER resident Dr. Hoyle Sauer.  Delos Haring, PA-C 04/16/15 1019  Debby Freiberg, MD 04/22/15 1021

## 2015-04-15 NOTE — ED Notes (Signed)
Pt is unable to void at this time.  

## 2015-04-15 NOTE — ED Notes (Signed)
Patient reports numbness and heaviness feeling to hands and legs when she woke up this am. Patient had cancer removed from right side of neck since 10-13. Patient reports she had trouble walking this am. No apparent weakness is noted on exam. No facial droop noted. Speech is hard to comprehend due to recent surgery but I do not hear any slurring.

## 2015-04-16 NOTE — ED Provider Notes (Signed)
Patient care received from Delos Haring, PA-C. Per report patient has Hemoccult stool pending. Patient has been having generalized malaise and fatigue, and has a history of cancer. Patient can follow-up with outpatient resources as instructed, but will need additional referral to GI if Hemoccult is positive.   Hemoccult test is negative. Patient discharged with guidance to follow up closely with her primary physician for repeat evaluation. Patient advised return in the event of any acute or worsening symptoms. All questions answered prior to discharge.  Patient care was discussed with ED attending, Dr. Colin Rhein.       Hoyle Sauer, MD 04/16/15 5183  Hoyle Sauer, MD 04/16/15 4373  Debby Freiberg, MD 04/22/15 1022

## 2015-05-10 ENCOUNTER — Ambulatory Visit (INDEPENDENT_AMBULATORY_CARE_PROVIDER_SITE_OTHER): Payer: Self-pay | Admitting: Internal Medicine

## 2015-05-10 VITALS — BP 129/91 | HR 81 | Ht 68.0 in | Wt 188.3 lb

## 2015-05-10 DIAGNOSIS — Z87891 Personal history of nicotine dependence: Secondary | ICD-10-CM

## 2015-05-10 DIAGNOSIS — I10 Essential (primary) hypertension: Secondary | ICD-10-CM

## 2015-05-10 DIAGNOSIS — Z79899 Other long term (current) drug therapy: Secondary | ICD-10-CM

## 2015-05-10 NOTE — Assessment & Plan Note (Signed)
Please see HPi for full info.  Her BP today is 129/91 and repeat one similar.  She currently takes 10 gm of lisinopril.  Advised the patient to continue 10 mg of lisinopril daily.  RTC in 4-6 months, earlier if needed.

## 2015-05-10 NOTE — Progress Notes (Signed)
Patient ID: Stacy Anderson, female   DOB: Jan 27, 1966, 49 y.o.   MRN: [id number]    Subjective:   Patient ID: Stacy Anderson female   DOB: July 19, 1965 49 y.o.   MRN: [id number]  HPI: [name] is a 49 y.o. woman with HTN, and SCC of inferior glossopharyngeal area who is here for a follow up regarding hypertension.  She is doing well since the resection of the mass back in August although she notes that she lost weight. She is currently able to toelrate liquid foods only- like milk and soups. Her currently weight is 188 lbs.  Right now she has a Dobhoff tube, and she is scheduled to have a PEG tube placed next week. She is trying to take more calories. She also follows up with speech pathology.  Regarding the [facility], it was operated by Dr Blenda Nicely at [facility], and she follows up with them. 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 They are following with close surveillance.   Regarding her Hypertension, her lisinopril was cut in half to 10 mg daily, at the last ENT visit. Today her blood pressure is 129/91.  Review of her lab is also remarkable for chronically elevated white count of around 18 and hemoglobin of 10.6, and an increased creatinine of 1.05 from 0.77 of baseline.   She had recently visited ER for some tingling of her feet and it wasnoted at that time that her hemoglobin had dropped 2 points to 10.6. Patient denies hematochezia, melena, n/v or other blood loss. An FOBT was performed in the ER visit and it was negative. Patient denies any blood loss at the current visit.  She had mentioned at that time that she was supposed to be taking in 3 cups of water through [facility] but she was not doing that.  Please see Problem List/A&P for the status of the patient's  chronic medical problems     Past Medical History  Diagnosis Date  . Hypertension   . Sensorineural hearing loss of both ears     likely secondary to the radiation therapy, chemotherapy and the vascular flow issues  . Left carotid artery stenosis   . Herpes genitalis 12/30/2008  . 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 Unspecified hearing loss 05/05/2010    Annotation: bil hearing aids since age 67 - nasal cancer Qualifier: Diagnosis of  By: Hassell Done FNP, Tori Milks    . 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 GERD (gastroesophageal reflux disease)   . Anemia    Current Outpatient Prescriptions  Medication Sig Dispense Refill  . lisinopril (PRINIVIL,ZESTRIL) 20 MG tablet Take 1 tablet (20 mg total) by mouth daily. 30 tablet 5  . acetaminophen-codeine (TYLENOL #3) 300-30 MG per tablet Take 1-2 tablets by mouth every 8 (eight) hours as needed for moderate pain. 120 tablet 0  . gabapentin (NEURONTIN) 250 MG/5ML solution Take 250 mg by mouth 3 (three) times daily.    Marland Kitchen lidocaine (XYLOCAINE) 2 % solution Use as directed 20 mLs in the mouth or throat as needed for mouth pain. 100 mL 1  . omeprazole (PRILOSEC) 40  MG capsule Take 1 capsule (40 mg total) by mouth daily. (Patient not taking: Reported on 04/15/2015) 30 capsule 3  . oxyCODONE (ROXICODONE) 5 MG/5ML solution Take 10-15 mg by mouth every 3 (three) hours as needed for moderate pain or severe pain.    . pantoprazole sodium (PROTONIX) 40 mg/20 mL PACK Take 40 mg by mouth daily.  0  . sucralfate (CARAFATE) 1 GM/10ML suspension Take 10 mLs (1 g total) by mouth 4 (four) times daily. (Patient not taking: Reported on 02/10/2015) 420 mL 1  . traMADol (ULTRAM) 50 MG tablet Take 1 tablet (50 mg total) by mouth every 8 (eight) hours as needed. (Patient not taking: Reported on 02/10/2015) 30 tablet 0   No  current facility-administered medications for this visit.   Family History  Problem Relation Age of Onset  . Hypertension Mother   . Heart disease Mother   . Diabetes Father   . Diabetes Sister   . Cancer Brother 74    Brain cancer    Social History   Social History  . Marital Status: Single    Spouse Name: N/A  . Number of Children: 0  . Years of Education: 12th grade   Occupational History  . [employer]   Social History Main Topics  . Smoking status: Former Smoker -- 0.25 packs/day for 20 years    Types: Cigarettes    Quit date: 09/07/2012  . Smokeless tobacco: Never Used  . Alcohol Use: No  . Drug Use: No  . Sexual Activity: Not on file   Other Topics Concern  . Not on file   Social History Narrative   Lives in [place], alone   Review of Systems: Review of Systems  Constitutional: Positive for weight loss. Negative for fever, chills and malaise/fatigue.  HENT: Negative for hearing loss.   Respiratory: Negative for cough, shortness of breath and wheezing.   Cardiovascular: Negative for chest pain and leg swelling.  Gastrointestinal: Negative for nausea, vomiting, abdominal pain, diarrhea, constipation, blood in stool and melena.  Genitourinary: Negative for hematuria.  Skin: Negative for rash.  Neurological: Positive for weakness. Negative for dizziness and headaches.    Objective:  Physical Exam: Filed Vitals:   05/10/15 1525  BP: 129/91  Pulse: 81  Height: 5\' 8"  (1.727 m)  Weight: 188 lb 4.8 oz (85.412 kg)  SpO2: 100%   Physical Exam  Constitutional: She appears well-developed.  Has a dobhoff tube in place  HENT:  Head: Normocephalic and atraumatic.  Cardiovascular: Normal rate, regular rhythm, normal heart sounds and intact distal pulses.   No murmur heard. Pulmonary/Chest: Effort normal and breath sounds normal. No respiratory distress. She has no wheezes.  Abdominal: Soft. Bowel sounds are normal. She exhibits no distension. There  is no tenderness.    Assessment & Plan:   Please see problem based charting for assessment and plan

## 2015-05-10 NOTE — Patient Instructions (Signed)
Thank you for your visit today Please take 10 mg of lisinopril daily Please call back at your convenience about the medicine you were requesting for your acid reflux.  Please follow up in 4-6 months

## 2015-05-11 ENCOUNTER — Other Ambulatory Visit: Payer: Self-pay

## 2015-05-11 MED ORDER — PANTOPRAZOLE SODIUM 40 MG PO PACK: 40.0000 mg | PACK | Freq: Every day | ORAL | Status: DC

## 2015-05-11 NOTE — Progress Notes (Signed)
Internal Medicine Clinic Attending  I saw and evaluated the patient.  I personally confirmed the key portions of the history and exam documented by Dr. Saraiya and I reviewed pertinent patient test results.  The assessment, diagnosis, and plan were formulated together and I agree with the documentation in the resident's note.  

## 2015-05-11 NOTE — Telephone Encounter (Signed)
Order sent Pantoprazole liquid version

## 2015-06-21 ENCOUNTER — Emergency Department (HOSPITAL_COMMUNITY)
Admission: EM | Admit: 2015-06-21 | Discharge: 2015-06-21 | Disposition: A | Discharge status: Home / Self Care | Payer: Self-pay | Attending: Emergency Medicine | Admitting: Emergency Medicine

## 2015-06-21 ENCOUNTER — Encounter (HOSPITAL_COMMUNITY): Payer: Self-pay | Admitting: *Deleted

## 2015-06-21 DIAGNOSIS — Z8619 Personal history of other infectious and parasitic diseases: Secondary | ICD-10-CM | POA: Insufficient documentation

## 2015-06-21 DIAGNOSIS — T85598A Other mechanical complication of other gastrointestinal prosthetic devices, implants and grafts, initial encounter: Secondary | ICD-10-CM

## 2015-06-21 DIAGNOSIS — Z8522 Personal history of malignant neoplasm of nasal cavities, middle ear, and accessory sinuses: Secondary | ICD-10-CM | POA: Insufficient documentation

## 2015-06-21 DIAGNOSIS — K9423 Gastrostomy malfunction: Secondary | ICD-10-CM | POA: Insufficient documentation

## 2015-06-21 DIAGNOSIS — H903 Sensorineural hearing loss, bilateral: Secondary | ICD-10-CM | POA: Insufficient documentation

## 2015-06-21 DIAGNOSIS — K219 Gastro-esophageal reflux disease without esophagitis: Secondary | ICD-10-CM | POA: Insufficient documentation

## 2015-06-21 DIAGNOSIS — Z862 Personal history of diseases of the blood and blood-forming organs and certain disorders involving the immune mechanism: Secondary | ICD-10-CM | POA: Insufficient documentation

## 2015-06-21 DIAGNOSIS — I10 Essential (primary) hypertension: Secondary | ICD-10-CM | POA: Insufficient documentation

## 2015-06-21 DIAGNOSIS — Z79899 Other long term (current) drug therapy: Secondary | ICD-10-CM | POA: Insufficient documentation

## 2015-06-21 NOTE — ED Notes (Signed)
Per verbal Md order RN attempted to irrigated pt's nasal feeding tube with [employer] with no success; Pt in no acute distress at this time; Pt denies pain or discomfort with tubing; Feeding tube placed at [facility];

## 2015-06-21 NOTE — ED Provider Notes (Signed)
CSN: [id number]     Arrival date & time 06/21/15  2039 History   First MD Initiated Contact with Patient 06/21/15 2218     No chief complaint on file.    (Consider location/radiation/quality/duration/timing/severity/associated sxs/prior Treatment) HPI Comments: The patient is a 50 year old female, she has a history of cancer of her nasal cavities, she required oropharyngeal surgery and has had a tracheostomy, this has since been removed and the patient has been able to breathe and speak with her natural anatomy. Because of her surgery she has been unable to eat foods and has had a feeding tube placed over 2 months ago, since that time it has worked well until this evening, she states that it was clogged when she tried to use it this evening. She states that she is able to drink ensure shakes and liquids. She requests assistance with trying to declog her tube. This tube was placed at [facility]. She denies any other symptoms including vomiting or abdominal pain  The history is provided by the patient.    Past Medical History  Diagnosis Date  . Hypertension   . Sensorineural hearing loss of both ears     likely secondary to the radiation therapy, chemotherapy and the vascular flow issues  . Left carotid artery stenosis   . Herpes genitalis 12/30/2008  . 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 Unspecified hearing loss 05/05/2010    Annotation: bil hearing aids since age 39 - nasal cancer Qualifier: Diagnosis of  By: Hassell Done FNP, Tori Milks    . 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 GERD (gastroesophageal reflux disease)   . Anemia    Past Surgical History  Procedure Laterality Date  . Tubal ligation Bilateral 04/03/2003    Dr. Lorriane Shire  . Exploratory tympanotomy Right 06/05/2007    Minna Merritts, M.D.  .  Esophagogastroduodenoscopy N/A 11/01/2012    Procedure: ESOPHAGOGASTRODUODENOSCOPY (EGD);  Surgeon: Beryle Beams, MD;  Location: [facility] ENDOSCOPY;  Service: Endoscopy;  Laterality: N/A;  . Fracture surgery Left     hip  . Direct laryngoscopy N/A 02/10/2015    Procedure: DIRECT LARYNGOSCOPY WITH BIOPSY;  Surgeon: Izora Gala, MD;  Location: [facility] OR;  Service: ENT;  Laterality: N/A;   Family History  Problem Relation Age of Onset  . Hypertension Mother   . Heart disease Mother   . Diabetes Father   . Diabetes Sister   . Cancer Brother 55    Brain cancer    Social History  Substance Use Topics  . Smoking status: Former Smoker -- 0.25 packs/day for 20 years    Types: Cigarettes    Quit date: 09/07/2012  . Smokeless tobacco: Never Used  . Alcohol Use: No   OB History    No data available     Review of Systems  All other systems reviewed and are negative.     Allergies  Asa and Aleve  Home Medications   Prior to Admission medications   Medication Sig Start Date End Date Taking? Authorizing Provider  lisinopril (PRINIVIL,ZESTRIL) 20 MG tablet Take 1 tablet (20 mg total) by mouth daily. 02/03/15  Yes Marjan Rabbani, MD  lidocaine (XYLOCAINE) 2 % solution Use as directed 20 mLs in the mouth or throat as needed for mouth pain. 02/03/15   Juluis Mire, MD  omeprazole (PRILOSEC) 40 MG capsule Take 1 capsule (40 mg total) by mouth daily. Patient not taking: Reported on 04/15/2015 02/03/15   Juluis Mire, MD  pantoprazole sodium (PROTONIX) 40 mg/20 mL PACK Take 20 mLs (40 mg total) by mouth daily. 05/11/15   Burgess Estelle, MD  sucralfate (CARAFATE) 1 GM/10ML suspension Take 10 mLs (1 g total) by mouth 4 (four) times daily. 02/03/15 02/03/16  Marjan Rabbani, MD   BP 118/81 mmHg  Pulse 84  Temp(Src) 98.3 F (36.8 C)  Resp 18  Ht 5\' 7"  (1.702 m)  SpO2 100%  LMP 03/31/2013 Physical Exam  Constitutional: She appears well-developed and well-nourished. No distress.  HENT:  Head:  Normocephalic and atraumatic.  Mouth/Throat: Oropharynx is clear and moist. No oropharyngeal exudate.  Nasal intestinal tube in proper position in the right nostril  Eyes: Conjunctivae and EOM are normal. Pupils are equal, round, and reactive to light. Right eye exhibits no discharge. Left eye exhibits no discharge. No scleral icterus.  Neck: Normal range of motion. Neck supple. No JVD present. No thyromegaly present.  Cardiovascular: Normal rate, regular rhythm, normal heart sounds and intact distal pulses.  Exam reveals no gallop and no friction rub.   No murmur heard. Pulmonary/Chest: Effort normal and breath sounds normal. No respiratory distress. She has no wheezes. She has no rales.  Abdominal: Soft. Bowel sounds are normal. She exhibits no distension and no mass. There is no tenderness.  Musculoskeletal: Normal range of motion. She exhibits no edema or tenderness.  Lymphadenopathy:    She has no cervical adenopathy.  Neurological: She is alert. Coordination normal.  Skin: Skin is warm and dry. No rash noted. No erythema.  Psychiatric: She has a normal mood and affect. Her behavior is normal.  Nursing note and vitals reviewed.   ED Course  Procedures (including critical care time) Labs Review Labs Reviewed - No data to display  Imaging Review No results found. I have personally reviewed and evaluated these images and lab results as part of my medical decision-making.    MDM   Final diagnoses:  Feeding tube blocked, initial encounter    The patient is well-appearing, she is able to tolerate oral fluids and can be checked tomorrow at her Hospital where the procedure was done. There is no emergency condition that would require emergency transfer for stabilizing therapy. We are unable to unclog the tube, the nurses tried as well, the patient can have this replaced likely under radiology guidance given her complicated neck anatomy tomorrow.    Noemi Chapel, MD 06/21/15 [phone]

## 2015-06-21 NOTE — ED Notes (Signed)
Pt states that she her feeding tube has clogged. States that she has had the tube since October and has no issues since. States she tried flushing it and could not get the water through. States that she is supposed to get a PEG tube soon.

## 2015-06-21 NOTE — ED Notes (Signed)
Pt had already left the room when RN went in to discharge; Pt not AMA discharge papers were already done

## 2015-06-21 NOTE — Discharge Instructions (Signed)
We were unable to unblock your feeding tube - you have told me that you are able to drink ensure shakes and such - please do this tonight and go to [place] [facility] in the morning to seek help for replacement of this tube.

## 2015-06-21 NOTE — ED Notes (Signed)
MD at bedside. 

## 2015-07-16 MED FILL — HYDROCOD-APAP 7.5-325/15ML: 7.5-325 | 10 days supply | Qty: 473 | Fill #0

## 2015-07-23 ENCOUNTER — Ambulatory Visit: Payer: Self-pay

## 2015-07-29 ENCOUNTER — Ambulatory Visit: Payer: Self-pay

## 2015-07-30 MED FILL — CEPHALEXIN 250 MG/5 ML SUSP: 250 | 7 days supply | Qty: 300 | Fill #0

## 2015-11-22 ENCOUNTER — Telehealth: Payer: Self-pay

## 2015-11-22 DIAGNOSIS — I10 Essential (primary) hypertension: Secondary | ICD-10-CM

## 2015-11-22 MED ORDER — LISINOPRIL 20 MG PO TABS: 20.0000 mg | ORAL_TABLET | Freq: Every day | ORAL | Status: DC

## 2015-11-22 NOTE — Telephone Encounter (Signed)
Pt needs an appt Given 30 day supply  Thanks

## 2015-11-22 NOTE — Telephone Encounter (Signed)
Pt requesting lisinopril to be filled @ [facility].

## 2015-11-22 NOTE — Telephone Encounter (Signed)
Last office visit: 05/10/2015- told to f/u in 4-6 mths  Next appt: none scheduled-  notice sent to front office to make patient next avail with pcp.Despina Hidden Cassady6/5/20172:44 PM   Refill request sent to pcp for review, please advise.Despina Hidden Cassady6/5/20172:45 PM

## 2015-11-23 ENCOUNTER — Other Ambulatory Visit: Payer: Self-pay

## 2015-11-23 MED FILL — LISINOPRIL 20 MG TABLET: 20 | 30 days supply | Qty: 30 | Fill #0

## 2015-11-23 NOTE — Telephone Encounter (Signed)
Pharm got the script, they put it on file because pt has not picked anything up since feb, they are getting it ready now, i will inform pt

## 2015-11-23 NOTE — Telephone Encounter (Signed)
Spoke with pharmacy, lisinopril is being processed. Attempted to call patient with no answer. LVM to call back.

## 2015-11-23 NOTE — Telephone Encounter (Signed)
Pt states the lisinopril is not at the pharmacy. Please call pt back.

## 2015-11-25 NOTE — Telephone Encounter (Signed)
Patient is scheduled to see Dr. Tiburcio Pea on 12-20-15 @ 2:15 pm.

## 2015-12-20 ENCOUNTER — Ambulatory Visit (INDEPENDENT_AMBULATORY_CARE_PROVIDER_SITE_OTHER): Payer: Self-pay | Admitting: Internal Medicine

## 2015-12-20 ENCOUNTER — Encounter: Payer: Self-pay | Admitting: Internal Medicine

## 2015-12-20 VITALS — BP 116/88 | HR 86 | Temp 98.5°F | Ht 68.0 in | Wt 182.0 lb

## 2015-12-20 DIAGNOSIS — Z0000 Encounter for general adult medical examination without abnormal findings: Secondary | ICD-10-CM | POA: Insufficient documentation

## 2015-12-20 DIAGNOSIS — I10 Essential (primary) hypertension: Secondary | ICD-10-CM

## 2015-12-20 DIAGNOSIS — Z931 Gastrostomy status: Secondary | ICD-10-CM

## 2015-12-20 HISTORY — DX: Encounter for general adult medical examination without abnormal findings: Z00.00

## 2015-12-20 MED ORDER — LISINOPRIL 20 MG PO TABS: 20.0000 mg | ORAL_TABLET | Freq: Every day | ORAL | Status: DC

## 2015-12-20 MED FILL — LISINOPRIL 20 MG TABLET: 20 | 90 days supply | Qty: 90 | Fill #0

## 2015-12-20 NOTE — Assessment & Plan Note (Signed)
Her blood pressure is 116/88 and she is compliant with lisinopril 20 mg daily.  Continue lisinopril  and follow up in 3-6 months, earlier if needed

## 2015-12-20 NOTE — Assessment & Plan Note (Signed)
Referred for mammogram Patient would like to defer the pap smear till next visit

## 2015-12-20 NOTE — Progress Notes (Signed)
Patient ID: Stacy Anderson, female   DOB: 1965/08/27, 50 y.o.   MRN: [id number]     Subjective:   Patient ID: Stacy Anderson female   DOB: [birth year] 50 y.o.   MRN: [id number]  HPI: [name] is a 51 y.o. 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  She is followed by [facility] regarding her nasopharyngeal carcinoma and just followed with them on 6/28. A laryngoscopy was performed which showed no gross abnormality.   She says that she is doing well. She has regained her appetite. Her weight is now 182 lbs. She feels more energetic and her swallowing has improved considerably.  She has a G tube, and she is going to follow up with general surgery to have it removed. She denies any fevers, chills or blood loss.    Please see Problem List/A&P for the status of the patient's chronic medical problems      Past Medical History  Diagnosis Date  . Hypertension   . Sensorineural hearing loss of both ears     likely secondary to the radiation therapy, chemotherapy and the vascular flow issues  . Left carotid artery stenosis   . Herpes genitalis 12/30/2008  . 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 Unspecified hearing loss 05/05/2010    Annotation: bil hearing aids since age 51 - nasal cancer Qualifier: Diagnosis of  By: Hassell Done FNP, Tori Milks    . 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 GERD (gastroesophageal reflux disease)   . Anemia    Current Outpatient Prescriptions  Medication Sig Dispense Refill  . lidocaine (XYLOCAINE) 2 % solution Use as directed 20 mLs in the mouth or throat as needed for mouth pain. 100 mL 1  . lisinopril (PRINIVIL,ZESTRIL) 20 MG tablet Take 1 tablet (20 mg total)  by mouth daily. 30 tablet 0  . omeprazole (PRILOSEC) 40 MG capsule Take 1 capsule (40 mg total) by mouth daily. (Patient not taking: Reported on 04/15/2015) 30 capsule 3  . pantoprazole sodium (PROTONIX) 40 mg/20 mL PACK Take 20 mLs (40 mg total) by mouth daily. 30 each 0  . sucralfate (CARAFATE) 1 GM/10ML suspension Take 10 mLs (1 g total) by mouth 4 (four) times daily. 420 mL 1   No current facility-administered medications for this visit.   Family History  Problem Relation Age of Onset  . Hypertension Mother   . Heart disease Mother   . Diabetes Father   . Diabetes Sister   . Cancer Brother 53    Brain cancer    Social History   Social History  . Marital Status: Single    Spouse Name: N/A  . Number of Children: 0  . Years of Education: 12th grade   Occupational History  . [employer]   Social History Main Topics  . Smoking status: Former Smoker -- 0.25 packs/day for 20 years    Types: Cigarettes    Quit date: 09/07/2012  . Smokeless tobacco: Never Used  . Alcohol Use: No  . Drug Use: No  . Sexual Activity: Not Asked   Other Topics Concern  . None   Social History Narrative   Lives in  [place], alone   Review of Systems:   Constitutional: Negative for fever, chills, weight loss and malaise/fatigue. Marland Kitchen Has good appetite  HEENT: no headaches, cough Respiratory: Negative for shortness of breath and wheezing.  Gastrointestinal:Negative for nausea, vomiting  Has a G tube. Which is going to be removed soon   Objective:  Physical Exam: Filed Vitals:   12/20/15 1415  BP: 116/88  Pulse: 86  Temp: 98.5 F (36.9 C)  TempSrc: Oral  Height: 5\' 8"  (1.727 m)  Weight: 182 lb (82.555 kg)  SpO2: 100%    General: A&O, in NAD CV: RRR, normal s1, s2, no m/r/g,  Resp: equal and symmetric breath sounds, Abdomen: soft, nontender, nondistended, +BS in all 4 quadrants/. Has a G tube. Site c/d/i Skin: warm, dry, intact Extremities: pulses intact b/l, no  edema  Assessment & Plan:   Case discussed with Dr Daryll Drown Please see problem based charting for assessment and plan

## 2015-12-20 NOTE — Patient Instructions (Addendum)
Thank you for your visit today Please continue to take your blood pressure medicine  Please follow up in 2-4 weeks for your pap smear Please follow up for your mammogram

## 2015-12-27 NOTE — Progress Notes (Signed)
Internal Medicine Clinic Attending  Case discussed with Dr. Saraiya at the time of the visit.  We reviewed the resident's history and exam and pertinent patient test results.  I agree with the assessment, diagnosis, and plan of care documented in the resident's note.  

## 2016-01-11 ENCOUNTER — Other Ambulatory Visit: Payer: Self-pay | Admitting: Internal Medicine

## 2016-01-11 DIAGNOSIS — Z0000 Encounter for general adult medical examination without abnormal findings: Secondary | ICD-10-CM

## 2016-02-14 ENCOUNTER — Other Ambulatory Visit: Payer: Self-pay | Admitting: Internal Medicine

## 2016-02-14 DIAGNOSIS — Z1231 Encounter for screening mammogram for malignant neoplasm of breast: Secondary | ICD-10-CM

## 2016-03-17 ENCOUNTER — Telehealth: Payer: Self-pay | Admitting: Internal Medicine

## 2016-03-17 NOTE — Telephone Encounter (Signed)
APT. REMINDER CALL, LMTCB °

## 2016-03-20 ENCOUNTER — Ambulatory Visit: Payer: Self-pay

## 2016-03-20 ENCOUNTER — Other Ambulatory Visit: Payer: Self-pay | Admitting: Internal Medicine

## 2016-03-20 DIAGNOSIS — I10 Essential (primary) hypertension: Secondary | ICD-10-CM

## 2016-03-20 MED FILL — LISINOPRIL 20 MG TABLET: 20 | 90 days supply | Qty: 90 | Fill #0

## 2016-03-22 ENCOUNTER — Ambulatory Visit: Payer: Self-pay

## 2016-03-27 ENCOUNTER — Other Ambulatory Visit: Payer: Self-pay | Admitting: Obstetrics and Gynecology

## 2016-03-27 DIAGNOSIS — Z1231 Encounter for screening mammogram for malignant neoplasm of breast: Secondary | ICD-10-CM

## 2016-04-06 ENCOUNTER — Ambulatory Visit
Admission: RE | Admit: 2016-04-06 | Discharge: 2016-04-06 | Disposition: A | Discharge status: Home / Self Care | Payer: No Typology Code available for payment source | Source: Ambulatory Visit | Attending: Obstetrics and Gynecology | Admitting: Obstetrics and Gynecology

## 2016-04-06 ENCOUNTER — Ambulatory Visit (HOSPITAL_COMMUNITY)
Admission: RE | Admit: 2016-04-06 | Discharge: 2016-04-06 | Disposition: A | Discharge status: Home / Self Care | Payer: Self-pay | Source: Ambulatory Visit | Attending: Obstetrics and Gynecology | Admitting: Obstetrics and Gynecology

## 2016-04-06 ENCOUNTER — Encounter (HOSPITAL_COMMUNITY): Payer: Self-pay

## 2016-04-06 VITALS — BP 124/72 | Temp 98.0°F | Ht 68.0 in | Wt 182.6 lb

## 2016-04-06 DIAGNOSIS — Z1231 Encounter for screening mammogram for malignant neoplasm of breast: Secondary | ICD-10-CM

## 2016-04-06 DIAGNOSIS — Z01419 Encounter for gynecological examination (general) (routine) without abnormal findings: Secondary | ICD-10-CM

## 2016-04-06 NOTE — Patient Instructions (Signed)
Explained breast self awareness to Stacy Anderson. Let her know [facility] will cover Pap smears and HPV typing every 5 years unless has a history of abnormal Pap smears. Referred patient to the [facility] for a screening mammogram. Appointment scheduled for Thursday, April 06, 2016 at 1150. Let patient know will follow up with her within the next couple weeks with results of Pap smear by phone. Informed patient that the [facility] will follow up with her within the next couple of weeks with results to mammogram by letter or phone. Stacy Anderson verbalized understanding.  Ezrah Panning, Arvil Chaco, RN 10:10 AM

## 2016-04-06 NOTE — Progress Notes (Signed)
Pap smear

## 2016-04-06 NOTE — Progress Notes (Signed)
No complaints today.   Pap Smear: Pap smear completed today. Last Pap smear was 05/05/2010 by Dr. Hassell Done and normal. Per patient has no history of an abnormal Pap smear. Last Pap smear result is in EPIC.  Physical exam: Breasts Breasts symmetrical. No skin abnormalities bilateral breasts. No nipple retraction bilateral breasts. No nipple discharge bilateral breasts. No lymphadenopathy. No lumps palpated bilateral breasts. No complaints of pain or tenderness on exam. Referred patient to the [facility] for a screening mammogram. Appointment scheduled for Thursday, April 06, 2016 at 1150.  Pelvic/Bimanual   Ext Genitalia No lesions, no swelling and no discharge observed on external genitalia.         Vagina Vagina pink and normal texture. No lesions or discharge observed in vagina.          Cervix Cervix is present. Cervix pink and of normal texture. No discharge observed.     Uterus Uterus is present and palpable. Uterus in normal position and normal size.        Adnexae Bilateral ovaries present and palpable. No tenderness on palpation.          Rectovaginal No rectal exam completed today since patient had no rectal complaints. No skin abnormalities observed on exam.    Smoking History: Patient has never smoked.  Patient Navigation: Patient education provided. Access to services provided for patient through [facility] program.   Colorectal Cancer Screening: Per patient has never had a colonoscopy completed. No complaints today.

## 2016-04-07 ENCOUNTER — Encounter (HOSPITAL_COMMUNITY): Payer: Self-pay | Admitting: *Deleted

## 2016-04-07 LAB — CYTOLOGY - PAP
Diagnosis: NEGATIVE
HPV (WINDOPATH): NOT DETECTED

## 2016-04-11 ENCOUNTER — Telehealth (HOSPITAL_COMMUNITY): Payer: Self-pay | Admitting: *Deleted

## 2016-04-11 NOTE — Telephone Encounter (Signed)
Telephoned patient at home number and left message to return call to [facility] 

## 2016-05-08 ENCOUNTER — Telehealth (HOSPITAL_COMMUNITY): Payer: Self-pay | Admitting: *Deleted

## 2016-05-08 NOTE — Telephone Encounter (Signed)
Telephoned patient home number and left message to return call to [facility]

## 2016-06-02 ENCOUNTER — Telehealth (HOSPITAL_COMMUNITY): Payer: Self-pay | Admitting: *Deleted

## 2016-06-02 NOTE — Telephone Encounter (Signed)
Telephoned patient at home number and discussed negative pap smear results. HPV was negative. Next pap smear due in five years.

## 2016-06-20 ENCOUNTER — Encounter: Payer: Self-pay | Admitting: Internal Medicine

## 2016-06-20 ENCOUNTER — Ambulatory Visit (INDEPENDENT_AMBULATORY_CARE_PROVIDER_SITE_OTHER): Payer: Self-pay | Admitting: Internal Medicine

## 2016-06-20 ENCOUNTER — Ambulatory Visit: Payer: Self-pay

## 2016-06-20 VITALS — BP 122/80 | HR 94 | Temp 97.7°F | Ht 68.0 in | Wt 175.7 lb

## 2016-06-20 DIAGNOSIS — I10 Essential (primary) hypertension: Secondary | ICD-10-CM

## 2016-06-20 DIAGNOSIS — Z79899 Other long term (current) drug therapy: Secondary | ICD-10-CM

## 2016-06-20 DIAGNOSIS — Z87891 Personal history of nicotine dependence: Secondary | ICD-10-CM

## 2016-06-20 DIAGNOSIS — D509 Iron deficiency anemia, unspecified: Secondary | ICD-10-CM

## 2016-06-20 DIAGNOSIS — Z0000 Encounter for general adult medical examination without abnormal findings: Secondary | ICD-10-CM

## 2016-06-20 DIAGNOSIS — D649 Anemia, unspecified: Secondary | ICD-10-CM

## 2016-06-20 DIAGNOSIS — D539 Nutritional anemia, unspecified: Secondary | ICD-10-CM

## 2016-06-20 MED ORDER — LISINOPRIL 20 MG PO TABS: 20.0000 mg | ORAL_TABLET | Freq: Every day | ORAL | 3 refills | Status: DC

## 2016-06-20 MED FILL — LISINOPRIL 20 MG TABLET: 20 | 90 days supply | Qty: 90 | Fill #0

## 2016-06-20 NOTE — Assessment & Plan Note (Signed)
Reports having the flu shot already this year in September at her job.  Had mammogram done 03/2016. No mammographic evidence of malignancy.  Had her PAP smear done at [facility] for well woman visit 03/2016. Negative for any intraepithelial lesions or malignancy. Negative for HPV. Next due in 3 years.  Due for colonoscopy. Discussed with patient today and would like to hold off on it right now. Will have her follow up with her PCP for further discussion.

## 2016-06-20 NOTE — Assessment & Plan Note (Addendum)
Previously noted to have Iron deficiency anemia. She was on Fe supplements at one point in the past but not on any now and reports not being on any for 4-5 years. However, review of her labs shows macroyctic anemia in addition to her low ferritin. Her RDW did improve after starting Fe supplementation. Last CBC from 03/2015 shows Hgb 10.6 with MCV 100.0 and platelets of 764. No B12 or Folate labs in her records. Denies any numbness or tingling.   Plan Will check CBC, B12, folate today. Possible that she had an underlying B12 or folate deficiency in addition to her Fe deficiency anemia with the macrocytosis.

## 2016-06-20 NOTE — Assessment & Plan Note (Signed)
BP today is 122/80. Stacy Anderson is currently on Lisinopril 20 mg daily. Reports compliance. Does not check her BP at home. Denies any headaches, acute vision changes, nausea/vomiting, dizziness/lightheadedness. Last BMET was 03/2015. Did have mildly elevated Cr from her baseline (1.05 up from prior of 0.7-0.8) at that time but otherwise was unremarkable.   Plan Continue Lisinopril 20 mg daily  Check BMET today

## 2016-06-20 NOTE — Progress Notes (Signed)
   CC: HTN follow up  HPI:  [name] is a 51 y.o. female with a past medical history listed below here today for follow up of her HTN.    HTN - BP today is 122/80. Ms. Stacy Anderson is currently on Lisinopril 20 mg daily. Reports compliance. Does not check her BP at home. Denies any headaches, acute vision changes, nausea/vomiting, dizziness/lightheadedness. Last BMET was 03/2015. Did have mildly elevated Cr from her baseline (1.05 up from prior of 0.7-0.8) at that time but otherwise was unremarkable.   History of Anemia - Previously noted to have Iron deficiency anemia. Was on Fe supplements at one point in the past but not on any now. However, review of her labs shows macroyctic anemia. Last CBC from 03/2015 shows Hgb 10.6 with MCV 100.0 and platelets of 764. No B12 or Folate labs in her records. Denies any numbness or tingling.   HCM - Reports having the flu shot already this year in September at her job.  Had mammogram done 03/2016. No mammographic evidence of malignancy.  Had her PAP smear done at [facility] for well woman visit 03/2016. Negative for any intraepithelial lesions or malignancy. Negative for HPV. Next due in 3 years.  Due for colonoscopy.   For further details of today's visit and the status of her chronic medical issues please refer to the assessment and plan.  Past Medical History:  Diagnosis Date  . Anemia   . GERD (gastroesophageal reflux disease)   . Herpes genitalis 12/30/2008  . Hypertension   . Left carotid artery stenosis   . 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 Sensorineural hearing loss of both ears    likely secondary to the radiation therapy, chemotherapy and the vascular flow issues  . Unspecified hearing loss 05/05/2010   Annotation: bil hearing aids since  age 71 - nasal cancer Qualifier: Diagnosis of  By: Hassell Done FNP, [place]      Review of Systems:   Review of Systems  Eyes: Negative for blurred vision and double vision.  Cardiovascular: Negative for chest pain and palpitations.  Gastrointestinal: Negative for abdominal pain, nausea and vomiting.  Neurological: Negative for dizziness and headaches.   Physical Exam:  Vitals:   06/20/16 0842  BP: 122/80  Pulse: 94  Temp: 97.7 F (36.5 C)  TempSrc: Oral  SpO2: 100%  Weight: 175 lb 11.2 oz (79.7 kg)  Height: 5\' 8"  (1.727 m)   Physical Exam  Constitutional: She is well-developed, well-nourished, and in no distress.  Eyes: EOM are normal. Pupils are equal, round, and reactive to light.  Cardiovascular: Normal rate, regular rhythm and normal heart sounds.   Pulmonary/Chest: Effort normal and breath sounds normal.  Abdominal: Soft. Bowel sounds are normal. There is no tenderness.    Assessment & Plan:   See Encounters Tab for problem based charting.  Patient discussed with Dr. Lynnae January

## 2016-06-20 NOTE — Patient Instructions (Signed)
Stacy Anderson,  It was a pleasure meeting you today. I am going to check some lab work for you today. If anything comes back I will give you a call in the next 1-2 days.  Your blood pressure looks good and we will continue the Lisinopril 20 mg daily for now. I have sent in a refill for you today. We will get you set up with the stomach doctors to have your colonoscopy scheduled.   Follow up with your PCP in 3-6 months.

## 2016-06-21 LAB — BMP8+ANION GAP
ANION GAP: 15 mmol/L (ref 10.0–18.0)
BUN/Creatinine Ratio: 34 — ABNORMAL HIGH (ref 9–23)
BUN: 31 mg/dL — AB (ref 6–24)
CALCIUM: 9.8 mg/dL (ref 8.7–10.2)
CHLORIDE: 106 mmol/L (ref 96–106)
CO2: 21 mmol/L (ref 18–29)
Creatinine, Ser: 0.91 mg/dL (ref 0.57–1.00)
GFR calc Af Amer: 85 mL/min/{1.73_m2} (ref >59)
GFR calc non Af Amer: 74 mL/min/{1.73_m2} (ref >59)
GLUCOSE: 80 mg/dL (ref 65–99)
POTASSIUM: 4.4 mmol/L (ref 3.5–5.2)
Sodium: 142 mmol/L (ref 134–144)

## 2016-06-21 LAB — CBC
HEMOGLOBIN: 13 g/dL (ref 11.1–15.9)
Hematocrit: 37.9 % (ref 34.0–46.6)
MCH: 34.5 pg — ABNORMAL HIGH (ref 26.6–33.0)
MCHC: 34.3 g/dL (ref 31.5–35.7)
MCV: 101 fL — ABNORMAL HIGH (ref 79–97)
Platelets: 404 10*3/uL — ABNORMAL HIGH (ref 150–379)
RBC: 3.77 x10E6/uL (ref 3.77–5.28)
RDW: 13.8 % (ref 12.3–15.4)
WBC: 13.3 10*3/uL — AB (ref 3.4–10.8)

## 2016-06-21 LAB — FOLATE RBC
FOLATE, RBC: 824 ng/mL (ref >498)
Folate, Hemolysate: 312.4 ng/mL

## 2016-06-21 LAB — VITAMIN B12: Vitamin B-12: 693 pg/mL (ref 232–1245)

## 2016-06-23 NOTE — Progress Notes (Signed)
Internal Medicine Clinic Attending  Case discussed with Dr. Boswell soon after the resident saw the patient.  We reviewed the resident's history and exam and pertinent patient test results.  I agree with the assessment, diagnosis, and plan of care documented in the resident's note. 

## 2016-07-17 ENCOUNTER — Telehealth: Payer: Self-pay | Admitting: Internal Medicine

## 2016-07-17 NOTE — Telephone Encounter (Signed)
CALLED PT, LMTCB, NEEDS TO RENEW GCCN CARD

## 2016-09-18 ENCOUNTER — Other Ambulatory Visit: Payer: Self-pay

## 2016-09-18 MED FILL — LISINOPRIL 20 MG TABLET: 20 | 90 days supply | Qty: 90 | Fill #1

## 2016-09-18 NOTE — Telephone Encounter (Signed)
lisinopril (PRINIVIL,ZESTRIL) 20 MG tablet, refill request @ outpatient pharmacy.

## 2016-09-18 NOTE — Telephone Encounter (Signed)
Pt called / informed she has 3 refills on Lisinopril and to give the pharmacy a call. Stated she will.

## 2016-12-18 MED FILL — LISINOPRIL 20 MG TABLET: 20 | 90 days supply | Qty: 90 | Fill #2

## 2017-03-13 MED FILL — LISINOPRIL 20 MG TABS: 20 | 90 days supply | Qty: 90 | Fill #3

## 2017-03-30 ENCOUNTER — Encounter (HOSPITAL_COMMUNITY): Payer: Self-pay | Admitting: Emergency Medicine

## 2017-03-30 ENCOUNTER — Emergency Department (HOSPITAL_COMMUNITY)
Admission: EM | Admit: 2017-03-30 | Discharge: 2017-03-30 | Disposition: A | Discharge status: Home / Self Care | Payer: No Typology Code available for payment source | Attending: Emergency Medicine | Admitting: Emergency Medicine

## 2017-03-30 ENCOUNTER — Emergency Department (HOSPITAL_COMMUNITY): Payer: No Typology Code available for payment source

## 2017-03-30 DIAGNOSIS — I10 Essential (primary) hypertension: Secondary | ICD-10-CM | POA: Diagnosis not present

## 2017-03-30 DIAGNOSIS — Y92410 Unspecified street and highway as the place of occurrence of the external cause: Secondary | ICD-10-CM | POA: Diagnosis not present

## 2017-03-30 DIAGNOSIS — M25512 Pain in left shoulder: Secondary | ICD-10-CM | POA: Insufficient documentation

## 2017-03-30 DIAGNOSIS — M791 Myalgia, unspecified site: Secondary | ICD-10-CM | POA: Diagnosis not present

## 2017-03-30 DIAGNOSIS — Y998 Other external cause status: Secondary | ICD-10-CM | POA: Insufficient documentation

## 2017-03-30 DIAGNOSIS — Z87891 Personal history of nicotine dependence: Secondary | ICD-10-CM | POA: Insufficient documentation

## 2017-03-30 DIAGNOSIS — Y939 Activity, unspecified: Secondary | ICD-10-CM | POA: Insufficient documentation

## 2017-03-30 DIAGNOSIS — M7918 Myalgia, other site: Secondary | ICD-10-CM

## 2017-03-30 MED ORDER — IBUPROFEN 600 MG PO TABS: 600.0000 mg | ORAL_TABLET | Freq: Four times a day (QID) | ORAL | 0 refills | Status: DC | PRN

## 2017-03-30 MED ORDER — CYCLOBENZAPRINE HCL 10 MG PO TABS: 10.0000 mg | ORAL_TABLET | Freq: Two times a day (BID) | ORAL | 0 refills | Status: DC | PRN

## 2017-03-30 MED ORDER — IBUPROFEN 400 MG PO TABS
400.0000 mg | ORAL_TABLET | Freq: Once | ORAL | Status: AC
  Administered 2017-03-30: 400 mg via ORAL
  Filled 2017-03-30: qty 1

## 2017-03-30 MED ORDER — CYCLOBENZAPRINE HCL 10 MG PO TABS
5.0000 mg | ORAL_TABLET | Freq: Once | ORAL | Status: AC
  Administered 2017-03-30: 5 mg via ORAL
  Filled 2017-03-30: qty 1
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
## 2017-06-08 ENCOUNTER — Ambulatory Visit (INDEPENDENT_AMBULATORY_CARE_PROVIDER_SITE_OTHER): Payer: Self-pay | Admitting: Internal Medicine

## 2017-06-08 ENCOUNTER — Encounter: Payer: Self-pay | Admitting: Internal Medicine

## 2017-06-08 ENCOUNTER — Other Ambulatory Visit: Payer: Self-pay

## 2017-06-08 DIAGNOSIS — Z87891 Personal history of nicotine dependence: Secondary | ICD-10-CM

## 2017-06-08 DIAGNOSIS — I10 Essential (primary) hypertension: Secondary | ICD-10-CM

## 2017-06-08 DIAGNOSIS — Z79899 Other long term (current) drug therapy: Secondary | ICD-10-CM

## 2017-06-08 DIAGNOSIS — Z23 Encounter for immunization: Secondary | ICD-10-CM

## 2017-06-08 MED ORDER — LISINOPRIL 20 MG PO TABS: 20.0000 mg | ORAL_TABLET | Freq: Every day | ORAL | 3 refills | Status: DC

## 2017-06-08 MED FILL — LISINOPRIL 20 MG TABLET: 20 | 90 days supply | Qty: 90 | Fill #0

## 2017-06-08 NOTE — Progress Notes (Signed)
Internal Medicine Clinic Attending  Case discussed with Dr. Wallace at the time of the visit.  We reviewed the resident's history and exam and pertinent patient test results.  I agree with the assessment, diagnosis, and plan of care documented in the resident's note.  

## 2017-06-08 NOTE — Patient Instructions (Signed)
FOLLOW-UP INSTRUCTIONS When: next available with Dr Tiburcio Pea For: Healthcare maintenance discussion What to bring: current medications  We are checking labs today.  I'll let you know if anything is abnormal.  I have refilled your Lisinopril.  Take Care.

## 2017-06-08 NOTE — Progress Notes (Addendum)
   CC: here for HTN f/u  HPI:  Stacy Anderson is a 51 y.o. woman with a past medical history listed below here today for follow up of her HTN.  For details of today's visit and the status of her chronic medical issues please refer to the assessment and plan.   Past Medical History:  Diagnosis Date  . Anemia   . GERD (gastroesophageal reflux disease)   . Herpes genitalis 12/30/2008  . Hypertension   . Left carotid artery stenosis   . 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 Sensorineural hearing loss of both ears    likely secondary to the radiation therapy, chemotherapy and the vascular flow issues  . Unspecified hearing loss 05/05/2010   Annotation: bil hearing aids since age 44 - nasal cancer Qualifier: Diagnosis of  By: Hassell Done FNP, Tori Milks     Review of Systems:   Review of Systems  Constitutional: Negative.   Respiratory: Negative.   Cardiovascular: Negative.      Physical Exam:  Vitals:   06/08/17 0928  BP: (!) 136/95  Pulse: 90  Temp: 98 F (36.7 C)  TempSrc: Oral  Weight: 167 lb 9.6 oz (76 kg)  Height: 5\' 7"  (1.702 m)   Physical Exam  Constitutional: She is oriented to person, place, and time and well-developed, well-nourished, and in no distress.  HENT:  Head: Normocephalic and atraumatic.  Cardiovascular: Normal rate and regular rhythm.  Pulmonary/Chest: Effort normal.  Neurological: She is alert and oriented to person, place, and time.  Psychiatric: Mood and affect normal.    Assessment & Plan:   See Encounters Tab for problem based charting.  Patient discussed with Dr. Angelia Mould.  HYPERTENSION, BENIGN ESSENTIAL Assessment: BP mild elevated today at 136/95 on monotherapy with Lisinopril alone.  Previously BP has been well controlled.  She reports good  adherence.  Still has 4 pills left before running out on this prescription.  Last BMET about 1 year ago unremarkable.  Plan: - Refill Lisinopril 20mg  daily - BMET - Follow up with PCP.  Historically, BP has been well controlled.  Will defer to PCP regarding more aggressive titration of BP with consideration of diuretic or calcium channel blocker.

## 2017-06-08 NOTE — Assessment & Plan Note (Addendum)
Assessment: BP mild elevated today at 136/95 on monotherapy with Lisinopril alone.  Previously BP has been well controlled.  She reports good adherence.  Still has 4 pills left before running out on this prescription.  Last BMET about 1 year ago unremarkable.  Plan: - Refill Lisinopril 20mg  daily - BMET - Follow up with PCP.  Historically, BP has been well controlled.  Will defer to PCP regarding more aggressive titration of BP with consideration of diuretic or calcium channel blocker.

## 2017-06-09 LAB — BMP8+ANION GAP
ANION GAP: 15 mmol/L (ref 10.0–18.0)
BUN/Creatinine Ratio: 26 — ABNORMAL HIGH (ref 9–23)
BUN: 26 mg/dL — ABNORMAL HIGH (ref 6–24)
CALCIUM: 9.5 mg/dL (ref 8.7–10.2)
CHLORIDE: 109 mmol/L — AB (ref 96–106)
CO2: 19 mmol/L — AB (ref 20–29)
Creatinine, Ser: 1.01 mg/dL — ABNORMAL HIGH (ref 0.57–1.00)
GFR calc Af Amer: 74 mL/min/{1.73_m2} (ref >59)
GFR calc non Af Amer: 65 mL/min/{1.73_m2} (ref >59)
GLUCOSE: 89 mg/dL (ref 65–99)
POTASSIUM: 4.5 mmol/L (ref 3.5–5.2)
SODIUM: 143 mmol/L (ref 134–144)

## 2017-06-15 ENCOUNTER — Ambulatory Visit: Payer: Self-pay

## 2017-07-18 ENCOUNTER — Encounter: Payer: Self-pay | Admitting: *Deleted

## 2017-07-23 MED FILL — FLUCONAZOLE 100 MG TABLET: 100 | 9 days supply | Qty: 10 | Fill #0

## 2017-09-10 MED FILL — LISINOPRIL 20 MG TABLET: 20 | 90 days supply | Qty: 90 | Fill #1

## 2017-12-06 MED FILL — LISINOPRIL 20 MG TABLET: 20 | 90 days supply | Qty: 90 | Fill #2

## 2018-03-08 MED FILL — LISINOPRIL 20 MG TABLET: 20 | 90 days supply | Qty: 90 | Fill #3

## 2018-06-04 MED FILL — LISINOPRIL 20 MG TABLET: 20 | 90 days supply | Qty: 90 | Fill #0

## 2018-09-05 ENCOUNTER — Other Ambulatory Visit: Payer: Self-pay

## 2018-09-05 DIAGNOSIS — I10 Essential (primary) hypertension: Secondary | ICD-10-CM

## 2018-09-05 MED ORDER — LISINOPRIL 20 MG PO TABS: 20.0000 mg | ORAL_TABLET | Freq: Every day | ORAL | 0 refills | Status: DC

## 2018-09-05 MED FILL — LISINOPRIL 20 MG TABLET: 20 | 30 days supply | Qty: 30 | Fill #0 | Status: TO

## 2018-09-05 NOTE — Telephone Encounter (Signed)
Refilled for 90 days. Patient will need a BMP, BP check and appointment before an additional refill is provided.

## 2018-09-05 NOTE — Telephone Encounter (Signed)
lisinopril (PRINIVIL,ZESTRIL) 20 MG tablet, refill request @  [place], [place] - [address] [phone] (Phone) [phone] (Fax)

## 2018-10-07 MED FILL — LISINOPRIL 20 MG TABLET: 20 | 30 days supply | Qty: 30 | Fill #0

## 2018-11-04 MED FILL — LISINOPRIL 20 MG TABLET: 20 | 30 days supply | Qty: 30 | Fill #1

## 2018-11-27 ENCOUNTER — Ambulatory Visit (INDEPENDENT_AMBULATORY_CARE_PROVIDER_SITE_OTHER): Payer: BC Managed Care – PPO | Admitting: Internal Medicine

## 2018-11-27 ENCOUNTER — Other Ambulatory Visit: Payer: Self-pay

## 2018-11-27 ENCOUNTER — Encounter: Payer: Self-pay | Admitting: Internal Medicine

## 2018-11-27 VITALS — BP 117/75 | HR 75 | Temp 98.1°F | Wt 188.9 lb

## 2018-11-27 DIAGNOSIS — J45909 Unspecified asthma, uncomplicated: Secondary | ICD-10-CM | POA: Diagnosis not present

## 2018-11-27 DIAGNOSIS — D649 Anemia, unspecified: Secondary | ICD-10-CM

## 2018-11-27 DIAGNOSIS — Z79899 Other long term (current) drug therapy: Secondary | ICD-10-CM

## 2018-11-27 DIAGNOSIS — Z862 Personal history of diseases of the blood and blood-forming organs and certain disorders involving the immune mechanism: Secondary | ICD-10-CM

## 2018-11-27 DIAGNOSIS — Z13220 Encounter for screening for lipoid disorders: Secondary | ICD-10-CM | POA: Insufficient documentation

## 2018-11-27 DIAGNOSIS — I10 Essential (primary) hypertension: Secondary | ICD-10-CM | POA: Diagnosis not present

## 2018-11-27 DIAGNOSIS — Z1239 Encounter for other screening for malignant neoplasm of breast: Secondary | ICD-10-CM

## 2018-11-27 DIAGNOSIS — Z1211 Encounter for screening for malignant neoplasm of colon: Secondary | ICD-10-CM

## 2018-11-27 MED ORDER — LISINOPRIL 20 MG PO TABS: 20.0000 mg | ORAL_TABLET | Freq: Every day | ORAL | 3 refills | Status: DC

## 2018-11-27 NOTE — Progress Notes (Signed)
   CC: HTN and anemia  HPI:[name] is a 53 y.o. female who presents for evaluation of HTN and routine screening evaluations. Please see individual problem based A/P for details.  Past Medical History:  Diagnosis Date  . Anemia   . 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 Dysphagia 01/11/2015  . GERD (gastroesophageal reflux disease)   . Health care maintenance 12/20/2015  . Herpes genitalis 12/30/2008  . Hypertension   . Left carotid artery stenosis   . 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 Sensorineural hearing loss of both ears    likely secondary to the radiation therapy, chemotherapy and the vascular flow issues  . Unspecified hearing loss 05/05/2010   Annotation: bil hearing aids since age 63 - nasal cancer Qualifier: Diagnosis of  By: Hassell Done FNP, Tori Milks    . UNSPECIFIED SLEEP DISTURBANCE 05/05/2010   Qualifier: Diagnosis of  By: Hassell Done FNP, Tori Milks     Review of Systems:  ROS negative except as per HPI.  Physical Exam: Vitals:   11/27/18 1537 11/27/18 1554  BP: (!) 166/112 117/75  Pulse: 79 75  Temp: 98.1 F (36.7 C)   TempSrc: Oral   SpO2: 100%   Weight: 188 lb 14.4 oz (85.7 kg)    General: A/O x4, in no acute distress, afebrile, nondiaphoretic Cardio: RRR, no mrg's  Pulmonary: CTA bilaterally, no wheezing or crackles  Abdomen: Bowel sounds normal, soft, nontender  MSK: BLE nontender, nonedematous Psych: Appropriate affect, not depressed in appearance, engages well  Assessment & Plan:   See Encounters Tab for problem based charting.  Patient discussed with Dr. Daryll Drown

## 2018-11-27 NOTE — Patient Instructions (Signed)
FOLLOW-UP INSTRUCTIONS When: 6 months For: Routine evaluation What to bring: All of your medications  I have not made any changes to your medications today.   Today we discussed your high blood pressure, need for colon cancer screening, need for breast cancer screening and history of anemia. I will notify you of the results of any labs from today's evaluation when available to me.   I have placed a referral for the mammogram and colonoscopy. You will need to call and schedule the mammogram but someone will call you to schedule the colonoscopy.   Thank you for your visit to the [facility] today. If you have any questions or concerns please call us at [phone].

## 2018-11-28 ENCOUNTER — Telehealth: Payer: Self-pay | Admitting: Internal Medicine

## 2018-11-28 ENCOUNTER — Encounter: Payer: Self-pay | Admitting: Internal Medicine

## 2018-11-28 ENCOUNTER — Telehealth: Payer: Self-pay | Admitting: *Deleted

## 2018-11-28 DIAGNOSIS — D649 Anemia, unspecified: Secondary | ICD-10-CM

## 2018-11-28 LAB — BMP8+ANION GAP
Anion Gap: 15 mmol/L (ref 10.0–18.0)
BUN/Creatinine Ratio: 24 — ABNORMAL HIGH (ref 9–23)
BUN: 22 mg/dL (ref 6–24)
CO2: 18 mmol/L — ABNORMAL LOW (ref 20–29)
Calcium: 9.3 mg/dL (ref 8.7–10.2)
Chloride: 110 mmol/L — ABNORMAL HIGH (ref 96–106)
Creatinine, Ser: 0.93 mg/dL (ref 0.57–1.00)
GFR calc Af Amer: 82 mL/min/{1.73_m2} (ref >59)
GFR calc non Af Amer: 71 mL/min/{1.73_m2} (ref >59)
Glucose: 88 mg/dL (ref 65–99)
Potassium: 4.6 mmol/L (ref 3.5–5.2)
Sodium: 143 mmol/L (ref 134–144)

## 2018-11-28 LAB — CBC
Hematocrit: 35.2 % (ref 34.0–46.6)
Hemoglobin: 12 g/dL (ref 11.1–15.9)
MCH: 35.9 pg — ABNORMAL HIGH (ref 26.6–33.0)
MCHC: 34.1 g/dL (ref 31.5–35.7)
MCV: 105 fL — ABNORMAL HIGH (ref 79–97)
Platelets: 297 10*3/uL (ref 150–450)
RBC: 3.34 x10E6/uL — ABNORMAL LOW (ref 3.77–5.28)
RDW: 13.1 % (ref 11.7–15.4)
WBC: 10 10*3/uL (ref 3.4–10.8)

## 2018-11-28 NOTE — Assessment & Plan Note (Signed)
Breast cancer screening: Annual mammogram ordered and appropriate. Will follow-up.

## 2018-11-28 NOTE — Telephone Encounter (Signed)
Pt's sig other called back and stated pt did not understand some of what md was explaining to her due to disability- hearing. Went over dr harbrecht's note, explained in simple terms and informed pt and sig other that after next lab work dr will explain more. Set appt in pcp clinic for 7/29 but also informed them that may be able to cancel that appt, she wants someone to be with her when she speaks to dr Berline Lopes about labs since she does have an issue with hearing and she most trust sig other.

## 2018-11-28 NOTE — Assessment & Plan Note (Signed)
  Hx of Anemia: Hx of iron deficiency anemia which after iron supplementation improved with her most recent CBC prior to today indicating an elevated MCV. Obtained CBC today which showed a normal Hgb at 12.0 (no longer anemic but borderline) with an MCV 105. Folate and B12 in January 2018 normal with slight elevated MCV at that time. Differential for elevated MCV absent anemia is broad. I will contact the patient and discuss additional labs to evaluate if agreeable. Plan for TSH, smear, diff, repeat B-12/folate.

## 2018-11-28 NOTE — Telephone Encounter (Signed)
Called and updated patient on her resulting lab work demonstrating the macrocytosis with normal range hemoglobin. Uncertain what to make of this as this was present two years prior at which time a B12 and folate were also in the mid normal range.  Patient agreeable to call and schedule appointment for lab work. She denied EtOH use, denied medications aside from those on file.   Plan: Have ordered: TSH CBC w/ diff Blood smear pathology review.  Hepatic function panel B12/Folic acid

## 2018-11-28 NOTE — Assessment & Plan Note (Signed)
Hypertension: Patient's BP today is 166/112 initially repeated at 117/75 with a goal of <140/80. The patient endorses adherence to her medication regimen. She denied, chest pain, headache, visual changes, lightheadedness, weakness, dizziness on standing, swelling in the feet or ankles.   Plan: Continue lisinopril 20mg  daily, refilled BMP unremarkable, consistent with prior, stable sCr and electrolytes

## 2018-11-28 NOTE — Telephone Encounter (Signed)
Thank you. I agree that this would be ideal in person. There was, what I believe to be an understanding that follow-up labs would be needed which she agreed to but with some difficulty due to hearing and speach impairment.  Thank you

## 2018-11-28 NOTE — Assessment & Plan Note (Signed)
Colon cancer screening: Routine beginning at age 53 is appropriate. Referral placed.

## 2018-12-02 ENCOUNTER — Other Ambulatory Visit (INDEPENDENT_AMBULATORY_CARE_PROVIDER_SITE_OTHER): Payer: BC Managed Care – PPO

## 2018-12-02 ENCOUNTER — Other Ambulatory Visit: Payer: Self-pay

## 2018-12-02 DIAGNOSIS — D72829 Elevated white blood cell count, unspecified: Secondary | ICD-10-CM | POA: Diagnosis not present

## 2018-12-02 DIAGNOSIS — D649 Anemia, unspecified: Secondary | ICD-10-CM

## 2018-12-02 DIAGNOSIS — D7289 Other specified disorders of white blood cells: Secondary | ICD-10-CM | POA: Diagnosis not present

## 2018-12-02 LAB — CBC WITH DIFFERENTIAL/PLATELET
Abs Immature Granulocytes: 0.02 10*3/uL (ref 0.00–0.07)
Basophils Absolute: 0 10*3/uL (ref 0.0–0.1)
Basophils Relative: 0 %
Eosinophils Absolute: 0.3 10*3/uL (ref 0.0–0.5)
Eosinophils Relative: 3 %
HCT: 38.6 % (ref 36.0–46.0)
Hemoglobin: 12.9 g/dL (ref 12.0–15.0)
Immature Granulocytes: 0 %
Lymphocytes Relative: 26 %
Lymphs Abs: 2.9 10*3/uL (ref 0.7–4.0)
MCH: 35.4 pg — ABNORMAL HIGH (ref 26.0–34.0)
MCHC: 33.4 g/dL (ref 30.0–36.0)
MCV: 106 fL — ABNORMAL HIGH (ref 80.0–100.0)
Monocytes Absolute: 0.7 10*3/uL (ref 0.1–1.0)
Monocytes Relative: 6 %
Neutro Abs: 7.3 10*3/uL (ref 1.7–7.7)
Neutrophils Relative %: 65 %
Platelets: 322 10*3/uL (ref 150–400)
RBC: 3.64 MIL/uL — ABNORMAL LOW (ref 3.87–5.11)
RDW: 12.3 % (ref 11.5–15.5)
WBC: 11.3 10*3/uL — ABNORMAL HIGH (ref 4.0–10.5)
nRBC: 0 % (ref 0.0–0.2)

## 2018-12-02 MED FILL — LISINOPRIL 20 MG TABLET: 20 | 90 days supply | Qty: 90 | Fill #0

## 2018-12-02 NOTE — Addendum Note (Signed)
Addended by: Truddie Crumble on: 12/02/2018 09:05 AM   Modules accepted: Orders

## 2018-12-02 NOTE — Progress Notes (Signed)
Internal Medicine Clinic Attending  Case discussed with Dr. Harbrecht  soon after the resident saw the patient.  We reviewed the resident's history and exam and pertinent patient test results.  I agree with the assessment, diagnosis, and plan of care documented in the resident's note.  

## 2018-12-03 LAB — PATHOLOGIST SMEAR REVIEW

## 2018-12-03 LAB — HEPATIC FUNCTION PANEL
ALT: 9 IU/L (ref 0–32)
AST: 16 IU/L (ref 0–40)
Albumin: 4.4 g/dL (ref 3.8–4.9)
Alkaline Phosphatase: 68 IU/L (ref 39–117)
Bilirubin Total: 0.3 mg/dL (ref 0.0–1.2)
Bilirubin, Direct: 0.07 mg/dL (ref 0.00–0.40)
Total Protein: 7.1 g/dL (ref 6.0–8.5)

## 2018-12-03 LAB — FOLATE RBC
Folate, Hemolysate: 275 ng/mL
Folate, RBC: 722 ng/mL (ref >498)
Hematocrit: 38.1 % (ref 34.0–46.6)

## 2018-12-03 LAB — VITAMIN B12: Vitamin B-12: 609 pg/mL (ref 232–1245)

## 2018-12-03 LAB — TSH: TSH: 2.18 u[IU]/mL (ref 0.450–4.500)

## 2018-12-04 DIAGNOSIS — C01 Malignant neoplasm of base of tongue: Secondary | ICD-10-CM | POA: Diagnosis not present

## 2018-12-19 ENCOUNTER — Encounter: Payer: Self-pay | Admitting: *Deleted

## 2019-01-13 ENCOUNTER — Encounter: Payer: Self-pay | Admitting: *Deleted

## 2019-01-15 ENCOUNTER — Encounter: Payer: Self-pay | Admitting: Internal Medicine

## 2019-01-15 ENCOUNTER — Encounter (INDEPENDENT_AMBULATORY_CARE_PROVIDER_SITE_OTHER): Payer: Self-pay

## 2019-01-15 ENCOUNTER — Ambulatory Visit (INDEPENDENT_AMBULATORY_CARE_PROVIDER_SITE_OTHER): Payer: BC Managed Care – PPO | Admitting: Internal Medicine

## 2019-01-15 ENCOUNTER — Other Ambulatory Visit: Payer: Self-pay | Admitting: Gastroenterology

## 2019-01-15 ENCOUNTER — Other Ambulatory Visit: Payer: Self-pay

## 2019-01-15 DIAGNOSIS — Z1211 Encounter for screening for malignant neoplasm of colon: Secondary | ICD-10-CM

## 2019-01-15 DIAGNOSIS — Z79899 Other long term (current) drug therapy: Secondary | ICD-10-CM

## 2019-01-15 DIAGNOSIS — H9193 Unspecified hearing loss, bilateral: Secondary | ICD-10-CM

## 2019-01-15 DIAGNOSIS — D7589 Other specified diseases of blood and blood-forming organs: Secondary | ICD-10-CM

## 2019-01-15 DIAGNOSIS — I10 Essential (primary) hypertension: Secondary | ICD-10-CM

## 2019-01-15 MED FILL — GAVILYTE-G SOLUTION: 236 | 1 days supply | Qty: 4000 | Fill #0

## 2019-01-15 NOTE — Progress Notes (Signed)
   CC: Elevated MCV  HPI:Ms.RINDI BEECHY is a 53 y.o. female who presents for discussion of her elevated MCV. Please see individual problem based A/P for details.  Past Medical History:  Diagnosis Date  . Anemia   . 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 Dysphagia 01/11/2015  . GERD (gastroesophageal reflux disease)   . Health care maintenance 12/20/2015  . Herpes genitalis 12/30/2008  . Hypertension   . Left carotid artery stenosis   . 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 Sensorineural hearing loss of both ears    likely secondary to the radiation therapy, chemotherapy and the vascular flow issues  . Unspecified hearing loss 05/05/2010   Annotation: bil hearing aids since age 16 - nasal cancer Qualifier: Diagnosis of  By: Hassell Done FNP, Tori Milks    . UNSPECIFIED SLEEP DISTURBANCE 05/05/2010   Qualifier: Diagnosis of  By: Hassell Done FNP, Tori Milks     Review of Systems:   ROS negative except as per HPI.  Physical Exam: Vitals:   01/15/19 1523  BP: 101/75  Pulse: 91  Temp: 98.3 F (36.8 C)  TempSrc: Oral  SpO2: 99%  Weight: 186 lb (84.4 kg)  Height: 5\' 8"  (1.727 m)   General: A/O x4, in no acute distress, afebrile, nondiaphoretic Cardio: RRR, no mrg's  Pulmonary: CTA bilaterally, no wheezing or crackles  Abdomen: Bowel sounds normal, soft, nontender  MSK: BLE nontender, nonedematous Psych: Appropriate affect, not depressed in appearance, engages well  Assessment & Plan:   See Encounters Tab for problem based charting.  Patient discussed with Dr. Dareen Piano

## 2019-01-15 NOTE — Patient Instructions (Signed)
FOLLOW-UP INSTRUCTIONS When: 5 months For: routine appointment What to bring: All of your medications  I have not made any changes to your medications today.   Today we discussed your elevated MCV or the abnormal blood lab and the investigation into why this was abnormal that I completed. I will continue to follow this at your routine appointments in the future.  Thank you for your visit to the [facility] today. If you have any questions or concerns please call us at [phone].

## 2019-01-16 ENCOUNTER — Encounter: Payer: Self-pay | Admitting: Internal Medicine

## 2019-01-16 NOTE — Assessment & Plan Note (Signed)
Blood pressure stable at 101/75. Continue lisinopril 20 mg daily

## 2019-01-16 NOTE — Assessment & Plan Note (Signed)
Patient is seen GI for colonoscopy this month.

## 2019-01-16 NOTE — Assessment & Plan Note (Signed)
Elevated MCV: Patient presents today for follow-up discussion regarding her chronically elevated mean corpuscular volume.  Given patient's hearing disability she requested in person visit to discuss.  At her prior visit we obtained a new CBC with differential, pathologist smear review, liver profile, folic acid, vitamin K32, and a TSH. The CBC demonstrated leukocytosis of 11.3 which is not abnormal for Stacy Anderson, the differential was unremarkable with no notable left shift.  Patient's hemoglobin has remained steady at 12.9.  MCV is persistently elevated at 106.  The pathologist review confirmed the leukocytosis and macrocytosis.  Liver profile failed to demonstrate an abnormality with albumin 4.4, total bilirubin 0.3, alk phos 68, AST 16, ALT 8 9.  The folate was normal at 722 and the patient's B12 was also normal at 609.  Her TSH was within normal limits at 2.18.  The patient has no complaints of fatigue, nausea, vomiting, diarrhea, constipation, headache, visual changes, chest pain, dyspnea, cough, shortness of breath, myalgias, joint aches or pains, and she denies EtOH use or illicit drug use.  Plan: Minus other abnormality including lack of anemia, we will monitor this annually unless other processes develop.

## 2019-01-21 NOTE — Progress Notes (Signed)
Internal Medicine Clinic Attending  Case discussed with Dr. Harbrecht at the time of the visit.  We reviewed the resident's history and exam and pertinent patient test results.  I agree with the assessment, diagnosis, and plan of care documented in the resident's note.   

## 2019-01-22 NOTE — Addendum Note (Signed)
Addended by: Hulan Fray on: 01/22/2019 05:44 PM   Modules accepted: Orders

## 2019-01-28 ENCOUNTER — Other Ambulatory Visit: Payer: Self-pay

## 2019-01-28 ENCOUNTER — Ambulatory Visit
Admission: RE | Admit: 2019-01-28 | Discharge: 2019-01-28 | Disposition: A | Discharge status: Home / Self Care | Payer: BC Managed Care – PPO | Source: Ambulatory Visit | Attending: Student in an Organized Health Care Education/Training Program | Admitting: Student in an Organized Health Care Education/Training Program

## 2019-01-28 DIAGNOSIS — Z1239 Encounter for other screening for malignant neoplasm of breast: Secondary | ICD-10-CM

## 2019-02-04 ENCOUNTER — Other Ambulatory Visit (HOSPITAL_COMMUNITY): Admission: RE | Admit: 2019-02-04 | Payer: BC Managed Care – PPO | Source: Ambulatory Visit

## 2019-02-06 NOTE — Progress Notes (Signed)
Called and left message on patients voice mail that their colonoscopy would need to be canceled for tomorrow since she did not have her covid test on 8/18. I left her Dr. Ulyses Amor office [phone]. I called and left a message with Dr. Ulyses Amor office that she would need to be rescheduled

## 2019-02-06 NOTE — Progress Notes (Signed)
Pt called [place] back and I confirmed with her that her procedure tomorrow needed to be rescheduled with Dr. Benson Norway due to she has not had her Covid test. Pt agreed and understood to call Dr. Benson Norway to reschedule her colonoscopy.

## 2019-02-07 ENCOUNTER — Ambulatory Visit (HOSPITAL_COMMUNITY)
Admission: RE | Admit: 2019-02-07 | Payer: BC Managed Care – PPO | Source: Home / Self Care | Admitting: Gastroenterology

## 2019-02-07 ENCOUNTER — Encounter (HOSPITAL_COMMUNITY): Admission: RE | Payer: Self-pay | Source: Home / Self Care

## 2019-02-07 SURGERY — COLONOSCOPY WITH PROPOFOL
Anesthesia: Monitor Anesthesia Care

## 2019-02-18 ENCOUNTER — Other Ambulatory Visit (HOSPITAL_COMMUNITY): Admission: RE | Admit: 2019-02-18 | Payer: Self-pay | Source: Ambulatory Visit

## 2019-02-21 ENCOUNTER — Encounter (HOSPITAL_COMMUNITY): Payer: Self-pay

## 2019-02-21 ENCOUNTER — Ambulatory Visit (HOSPITAL_COMMUNITY): Admit: 2019-02-21 | Payer: Self-pay | Admitting: Gastroenterology

## 2019-02-21 SURGERY — COLONOSCOPY WITH PROPOFOL
Anesthesia: Monitor Anesthesia Care

## 2019-03-03 MED FILL — LISINOPRIL 20 MG TABLET: 20 | 90 days supply | Qty: 90 | Fill #1

## 2019-03-26 ENCOUNTER — Ambulatory Visit (INDEPENDENT_AMBULATORY_CARE_PROVIDER_SITE_OTHER): Payer: Self-pay | Admitting: Internal Medicine

## 2019-03-26 ENCOUNTER — Other Ambulatory Visit: Payer: Self-pay

## 2019-03-26 ENCOUNTER — Encounter: Payer: Self-pay | Admitting: Internal Medicine

## 2019-03-26 VITALS — BP 107/74 | HR 100 | Temp 98.2°F | Ht 67.5 in | Wt 185.8 lb

## 2019-03-26 DIAGNOSIS — R1032 Left lower quadrant pain: Secondary | ICD-10-CM

## 2019-03-26 DIAGNOSIS — R109 Unspecified abdominal pain: Secondary | ICD-10-CM

## 2019-03-26 LAB — POCT URINALYSIS DIPSTICK OB
Blood, UA: NEGATIVE
Glucose, UA: NEGATIVE
Leukocytes, UA: NEGATIVE
Nitrite, UA: NEGATIVE
Spec Grav, UA: 1.025 (ref 1.010–1.025)
Urobilinogen, UA: 1 E.U./dL
pH, UA: 6 (ref 5.0–8.0)

## 2019-03-26 MED ORDER — DICLOFENAC SODIUM 1 % TD GEL: 2.0000 g | Freq: Four times a day (QID) | TRANSDERMAL | 0 refills | Status: DC

## 2019-03-26 MED FILL — DICLOFENAC SODIUM 1 % GEL: 1 | 12 days supply | Qty: 100 | Fill #0

## 2019-03-26 NOTE — Patient Instructions (Addendum)
Thank you for allowing me to provide your care. I think you like strained a muscle in your back. You can use the gel to provide relief. It should get better within the next 1-2 weeks.

## 2019-03-26 NOTE — Assessment & Plan Note (Addendum)
HPI:  Patient presents the clinic for evaluation of acute onset left flank pain. She states that it started abruptly on Sunday while she was out in her yard watering her plants. She does not remember any trauma or specific movements that caused the pain. She had not been lifting any heavy objects recently. The pain seems to wax and wane. She described as an aching pain radiating down her left flank. She denies the radiation into her groin. She is tried Tylenol which seems to greatly help with the pain. The other thing that she notes that alleviates the pain is moving around. She is not noticed any aggravating factors. She denies fevers, chills, central abdominal pain, changes in bowel habits, dysuria, hematuria, foul-smelling urine.  Physical exam is grossly unremarkable without any new skin lesions, tenderness to palpation, CVA tenderness. There is tenderness to palpation of the paraspinal muscles. Dipstick UA without hematuria  A/P: - Likely MSK etiology  - Will try topical diclofenac gel

## 2019-03-26 NOTE — Progress Notes (Signed)
   CC: Left flank pain  HPI:  [name] is a 53 y.o. female with PMHx listed below presenting for Left flank pain. Please see the A&P for the status of the patient's chronic medical problems.  Past Medical History:  Diagnosis Date  . Anemia   . 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 Dysphagia 01/11/2015  . GERD (gastroesophageal reflux disease)   . Health care maintenance 12/20/2015  . Herpes genitalis 12/30/2008  . Hypertension   . Left carotid artery stenosis   . 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 Sensorineural hearing loss of both ears    likely secondary to the radiation therapy, chemotherapy and the vascular flow issues  . Unspecified hearing loss 05/05/2010   Annotation: bil hearing aids since age 66 - nasal cancer Qualifier: Diagnosis of  By: Hassell Done FNP, Tori Milks    . UNSPECIFIED SLEEP DISTURBANCE 05/05/2010   Qualifier: Diagnosis of  By: Hassell Done FNP, Tori Milks     Review of Systems:  Performed and all others negative.  Physical Exam: Vitals:   03/26/19 1338 03/26/19 1340  BP:  107/74  Pulse:  100  Temp:  98.2 F (36.8 C)  TempSrc:  Oral  SpO2:  100%  Weight: 185 lb 12.8 oz (84.3 kg)   Height: 5' 7.5" (1.715 m)    General: Well nourished female in no acute distress Abdomen: Active bowel sounds, soft, non-distended, no tenderness to palpation, no CVA tenderness. Tenderness to palpation of the paraspinal muscles Skin: Warm and dry, no rashes   Assessment & Plan:   See Encounters Tab for problem based charting.  Patient discussed with Dr. Rebeca Alert

## 2019-03-31 NOTE — Progress Notes (Signed)
Internal Medicine Clinic Attending  Case discussed with Dr. Helberg at the time of the visit.  We reviewed the resident's history and exam and pertinent patient test results.  I agree with the assessment, diagnosis, and plan of care documented in the resident's note.  Alexander Raines, M.D., Ph.D.  

## 2019-05-08 ENCOUNTER — Other Ambulatory Visit: Payer: Self-pay

## 2019-05-08 ENCOUNTER — Encounter: Payer: Self-pay | Admitting: Internal Medicine

## 2019-05-08 ENCOUNTER — Ambulatory Visit (INDEPENDENT_AMBULATORY_CARE_PROVIDER_SITE_OTHER): Payer: Self-pay | Admitting: Internal Medicine

## 2019-05-08 VITALS — BP 148/104 | HR 86 | Temp 98.0°F | Ht 68.0 in | Wt 189.0 lb

## 2019-05-08 DIAGNOSIS — Z1211 Encounter for screening for malignant neoplasm of colon: Secondary | ICD-10-CM

## 2019-05-08 DIAGNOSIS — I10 Essential (primary) hypertension: Secondary | ICD-10-CM

## 2019-05-08 DIAGNOSIS — Z1231 Encounter for screening mammogram for malignant neoplasm of breast: Secondary | ICD-10-CM

## 2019-05-08 DIAGNOSIS — H5212 Myopia, left eye: Secondary | ICD-10-CM | POA: Diagnosis not present

## 2019-05-08 DIAGNOSIS — Z79899 Other long term (current) drug therapy: Secondary | ICD-10-CM

## 2019-05-08 DIAGNOSIS — H5340 Unspecified visual field defects: Secondary | ICD-10-CM | POA: Diagnosis not present

## 2019-05-08 NOTE — Assessment & Plan Note (Signed)
Screening mammogram normal in August 2020. Repeat in one year.

## 2019-05-08 NOTE — Progress Notes (Signed)
   CC: HTN  HPI:[name] is a 53 y.o. female who presents for evaluation of HTN. Please see individual problem based A/P for details.  Flu vaccine: Completed on 10/16 at work.   Past Medical History:  Diagnosis Date  . Anemia   . 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 Dysphagia 01/11/2015  . GERD (gastroesophageal reflux disease)   . Health care maintenance 12/20/2015  . Herpes genitalis 12/30/2008  . Hypertension   . Left carotid artery stenosis   . 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 Sensorineural hearing loss of both ears    likely secondary to the radiation therapy, chemotherapy and the vascular flow issues  . Unspecified hearing loss 05/05/2010   Annotation: bil hearing aids since age 63 - nasal cancer Qualifier: Diagnosis of  By: Stacy Done FNP, Stacy Anderson    . UNSPECIFIED SLEEP DISTURBANCE 05/05/2010   Qualifier: Diagnosis of  By: Stacy Done FNP, Stacy Anderson     Review of Systems:  ROS negative except as per HPI.  Physical Exam: Vitals:   05/08/19 1441  BP: (!) 157/107  Pulse: 88  Temp: 98 F (36.7 C)  TempSrc: Oral  SpO2: 98%  Weight: 189 lb (85.7 kg)  Height: 5\' 8"  (1.727 m)   General: A/O x4, in no acute distress, afebrile, nondiaphoretic HEENT: PEERL, EMO intact Cardio: RRR, no mrg's  Pulmonary: CTA bilaterally, no wheezing or crackles  Psych: Appropriate affect, not depressed in appearance, engages well  Assessment & Plan:   See Encounters Tab for problem based charting.  Patient discussed with Dr. Evette Doffing

## 2019-05-08 NOTE — Assessment & Plan Note (Signed)
  Colon cancer screening: Patient is scheduling her colonoscopy in the next 1-2 months.

## 2019-05-08 NOTE — Assessment & Plan Note (Signed)
  Hypertension: Patient's BP today is 157/107 initially and 148/104 on repeat with a goal of <140/80. The patient endorses adherence to her medication regimen. She denied, chest pain, headache, visual changes, lightheadedness, weakness, dizziness on standing, swelling in the feet or ankles. sCr normal at 0.93 in June 2020.   Plan: Continue lisinopril 10mg  daily Consider increase if her BP remains elevated at next visit BMP at next visit

## 2019-05-08 NOTE — Patient Instructions (Signed)
FOLLOW-UP INSTRUCTIONS When: 6 months For: Routine visit What to bring: All of your medications   I have not made any to  your medications. Please return in 6 months for a routine visit.   Thank you for your visit to the [facility] today. If you have any questions or concerns please call us at [phone].

## 2019-05-09 NOTE — Progress Notes (Signed)
Internal Medicine Clinic Attending  Case discussed with Dr. Harbrecht at the time of the visit.  We reviewed the resident's history and exam and pertinent patient test results.  I agree with the assessment, diagnosis, and plan of care documented in the resident's note.   

## 2019-06-03 MED FILL — LISINOPRIL 20 MG TABLET: 20 | 30 days supply | Qty: 30 | Fill #2

## 2019-06-05 DIAGNOSIS — H401232 Low-tension glaucoma, bilateral, moderate stage: Secondary | ICD-10-CM | POA: Diagnosis not present

## 2019-06-16 ENCOUNTER — Ambulatory Visit (INDEPENDENT_AMBULATORY_CARE_PROVIDER_SITE_OTHER): Payer: BLUE CROSS/BLUE SHIELD | Admitting: Internal Medicine

## 2019-06-16 ENCOUNTER — Encounter: Payer: Self-pay | Admitting: Internal Medicine

## 2019-06-16 VITALS — BP 121/94 | HR 88 | Temp 98.3°F | Ht 68.0 in | Wt 187.5 lb

## 2019-06-16 DIAGNOSIS — H9202 Otalgia, left ear: Secondary | ICD-10-CM

## 2019-06-16 DIAGNOSIS — I10 Essential (primary) hypertension: Secondary | ICD-10-CM

## 2019-06-16 DIAGNOSIS — H6692 Otitis media, unspecified, left ear: Secondary | ICD-10-CM | POA: Insufficient documentation

## 2019-06-16 DIAGNOSIS — Z79899 Other long term (current) drug therapy: Secondary | ICD-10-CM | POA: Diagnosis not present

## 2019-06-16 MED ORDER — AMOXICILLIN-POT CLAVULANATE 875-125 MG PO TABS: 1.0000 | ORAL_TABLET | Freq: Two times a day (BID) | ORAL | 0 refills | Status: DC

## 2019-06-16 MED FILL — AMOX-CLAV 875-125 MG TABLET: 875-125 | 5 days supply | Qty: 10 | Fill #0

## 2019-06-16 NOTE — Patient Instructions (Signed)
FOLLOW-UP INSTRUCTIONS When: As needed if your symptoms fail to resolve or worsen What to bring: All of your medications  For your ear pain there appears to be some irritation which could be an infection inside your ear.  We will treat this with an antibiotic for 5 days called Augmentin.  If your symptoms do not improve in the next 48 to 72 hours please let us know.  Thank you for your visit to the [facility] today. If you have any questions or concerns please call us at [phone].

## 2019-06-16 NOTE — Progress Notes (Signed)
   CC: left ear pain  HPI:[name] is a 53 y.o. female who presents for evaluation of left ear pain. Please see individual problem based A/P for details.  Past Medical History:  Diagnosis Date  . Anemia   . 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 Dysphagia 01/11/2015  . GERD (gastroesophageal reflux disease)   . Health care maintenance 12/20/2015  . Herpes genitalis 12/30/2008  . Hypertension   . Left carotid artery stenosis   . 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 Sensorineural hearing loss of both ears    likely secondary to the radiation therapy, chemotherapy and the vascular flow issues  . Unspecified hearing loss 05/05/2010   Annotation: bil hearing aids since age 85 - nasal cancer Qualifier: Diagnosis of  By: Hassell Done FNP, Tori Milks    . UNSPECIFIED SLEEP DISTURBANCE 05/05/2010   Qualifier: Diagnosis of  By: Hassell Done FNP, Tori Milks     Review of Systems:  ROS negative except as per HPI.  Physical Exam: Vitals:   06/16/19 1507  BP: (!) 121/94  Pulse: 88  Temp: 98.3 F (36.8 C)  TempSrc: Oral  SpO2: 96%  Weight: 187 lb 8 oz (85 kg)  Height: 5\' 8"  (1.727 m)   General: A/O x4, in no acute distress, afebrile, nondiaphoretic HENT: PEERL, EMO intact Ears: Right tympanic membrane is opaque but non-erythematous, Left is erythematous with associated tenderness on exam as well as loss of the light reflex. The external auditory canal was non-erythematous. Cardio: RRR, no mrg's  Pulmonary: CTA bilaterally,  no wheezing or crackles  Psych: Appropriate affect, not depressed in appearance, engages well  Assessment & Plan:   See Encounters Tab for problem  based charting.  Patient discussed with Dr. Evette Doffing

## 2019-06-16 NOTE — Assessment & Plan Note (Addendum)
Left ear pain: 2-3 days of acute onset left inner ear pain. She stated that there was a mild associated degree of rhinorrhea but no purulent sinus drainage. She also denied a sore throat, lacrimation, sinus pain or cough. She endorses an occasional popping sensation of her left ear with chewing and yawning. She is unable to wear her left hearing aid due to the pain.  On exam she appears to have inflammation of the tympanic membrane. I am unable to appreciate the post tympanic structures due to what appears to be increased thickness of the membrane and scarring. Based on the patients symptoms and the erythema of the tympanic membrane there is concern that this is an otitis media.   Plan: Augmentin 875/125 BID to five day Return precautions given

## 2019-06-16 NOTE — Assessment & Plan Note (Signed)
Hypertension: Patient's BP today is 121/94 with a goal of <140/80. The patient endorses adherence to her medication regimen. She denied, chest pain, headache, visual changes, lightheadedness, weakness, dizziness on standing, swelling in the feet or ankles.   Plan: Continue Lisinopril 20mg  daily

## 2019-06-17 NOTE — Progress Notes (Signed)
Internal Medicine Clinic Attending  Case discussed with Dr. Harbrecht at the time of the visit.  We reviewed the resident's history and exam and pertinent patient test results.  I agree with the assessment, diagnosis, and plan of care documented in the resident's note.   

## 2019-07-01 DIAGNOSIS — Z85810 Personal history of malignant neoplasm of tongue: Secondary | ICD-10-CM | POA: Diagnosis not present

## 2019-07-01 DIAGNOSIS — H6090 Unspecified otitis externa, unspecified ear: Secondary | ICD-10-CM | POA: Diagnosis not present

## 2019-07-01 DIAGNOSIS — H7201 Central perforation of tympanic membrane, right ear: Secondary | ICD-10-CM | POA: Diagnosis not present

## 2019-07-01 DIAGNOSIS — J3489 Other specified disorders of nose and nasal sinuses: Secondary | ICD-10-CM | POA: Diagnosis not present

## 2019-07-01 DIAGNOSIS — Z87891 Personal history of nicotine dependence: Secondary | ICD-10-CM | POA: Diagnosis not present

## 2019-07-01 DIAGNOSIS — Z85818 Personal history of malignant neoplasm of other sites of lip, oral cavity, and pharynx: Secondary | ICD-10-CM | POA: Diagnosis not present

## 2019-07-01 DIAGNOSIS — H6092 Unspecified otitis externa, left ear: Secondary | ICD-10-CM | POA: Diagnosis not present

## 2019-07-01 DIAGNOSIS — Z08 Encounter for follow-up examination after completed treatment for malignant neoplasm: Secondary | ICD-10-CM | POA: Diagnosis not present

## 2019-07-01 MED FILL — CIPROFLOXACIN-DEXAMETHASONE: 0.3-0.1 | 14 days supply | Qty: 8 | Fill #0

## 2019-07-02 MED FILL — LISINOPRIL 20 MG TABLET: 20 | 90 days supply | Qty: 90 | Fill #3

## 2019-09-30 MED FILL — LISINOPRIL 20 MG TABLET: 20 | 60 days supply | Qty: 60 | Fill #4

## 2019-10-20 ENCOUNTER — Encounter: Payer: Self-pay | Admitting: *Deleted

## 2019-11-26 ENCOUNTER — Other Ambulatory Visit: Payer: Self-pay | Admitting: Internal Medicine

## 2019-11-26 DIAGNOSIS — I10 Essential (primary) hypertension: Secondary | ICD-10-CM

## 2019-11-26 NOTE — Telephone Encounter (Signed)
Need refill on lisinopril (ZESTRIL) 20 MG tablet ;pt contact [place], [place] is requesting a 90-day supply

## 2019-11-26 NOTE — Telephone Encounter (Signed)
Encounter closed (duplicate request)

## 2019-12-01 ENCOUNTER — Encounter: Payer: Self-pay | Admitting: Internal Medicine

## 2019-12-01 NOTE — Telephone Encounter (Signed)
Called patient several times.  Unable to speak to the patient as her VM is full.  Will send a Letter to the patient to f/u.

## 2020-02-18 ENCOUNTER — Encounter: Payer: BLUE CROSS/BLUE SHIELD | Admitting: Student

## 2020-02-19 ENCOUNTER — Encounter: Payer: Self-pay | Admitting: Student

## 2020-02-19 ENCOUNTER — Ambulatory Visit (INDEPENDENT_AMBULATORY_CARE_PROVIDER_SITE_OTHER): Payer: BLUE CROSS/BLUE SHIELD | Admitting: Student

## 2020-02-19 ENCOUNTER — Other Ambulatory Visit: Payer: Self-pay

## 2020-02-19 VITALS — BP 95/50 | HR 66 | Temp 98.2°F | Ht 67.0 in | Wt 196.5 lb

## 2020-02-19 DIAGNOSIS — Z1159 Encounter for screening for other viral diseases: Secondary | ICD-10-CM | POA: Diagnosis not present

## 2020-02-19 DIAGNOSIS — R635 Abnormal weight gain: Secondary | ICD-10-CM

## 2020-02-19 DIAGNOSIS — Z13220 Encounter for screening for lipoid disorders: Secondary | ICD-10-CM

## 2020-02-19 DIAGNOSIS — Z1211 Encounter for screening for malignant neoplasm of colon: Secondary | ICD-10-CM | POA: Diagnosis not present

## 2020-02-19 DIAGNOSIS — Z1231 Encounter for screening mammogram for malignant neoplasm of breast: Secondary | ICD-10-CM

## 2020-02-19 DIAGNOSIS — I10 Essential (primary) hypertension: Secondary | ICD-10-CM | POA: Diagnosis not present

## 2020-02-19 DIAGNOSIS — Z23 Encounter for immunization: Secondary | ICD-10-CM | POA: Diagnosis not present

## 2020-02-19 NOTE — Progress Notes (Signed)
   CC: "I need my flu shot"  HPI:  [name] is a 54 y.o. person with history as below who presents to clinic for a well visit. Their last clinic visit was ~9 months ago on 06/16/19.   To see the details of this patient's management of their acute and chronic problems, please refer to the A&P under the Encounters tab.    Past Medical History:  Diagnosis Date  . Anemia   . 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 Dysphagia 01/11/2015  . GERD (gastroesophageal reflux disease)   . Health care maintenance 12/20/2015  . Herpes genitalis 12/30/2008  . Hypertension   . Left carotid artery stenosis   . 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 Sensorineural hearing loss of both ears    likely secondary to the radiation therapy, chemotherapy and the vascular flow issues  . Unspecified hearing loss 05/05/2010   Annotation: bil hearing aids since age 61 - nasal cancer Qualifier: Diagnosis of  By: Hassell Done FNP, Tori Milks    . UNSPECIFIED SLEEP DISTURBANCE 05/05/2010   Qualifier: Diagnosis of  By: Hassell Done FNP, Tori Milks     Review of Systems:    Review of Systems  Constitutional: Negative for malaise/fatigue.  HENT: Positive for hearing loss.   Eyes: Negative for blurred vision.  Respiratory: Positive for cough.        Cough is chronic  Cardiovascular: Negative for chest pain.  Gastrointestinal: Negative for blood in stool.  Musculoskeletal: Positive for joint pain.       Chronic joint pain  Skin: Negative for itching and rash.  Neurological: Negative for dizziness, weakness and headaches.     Physical Exam:  Vitals:   02/19/20 1338  BP: 93/73  Pulse: 85  Temp:  98.2 F (36.8 C)  TempSrc: Oral  SpO2: 97%  Weight: 196 lb 8 oz (89.1 kg)  Height: 5\' 7"  (1.702 m)   Constitutional: Very pleasant woman sitting in chair, in no acute distress; patient is very hard of hearing HENT: Normocephalic atraumatic, hearing aids present bilaterally Eyes: Conjunctiva not erythematous, no scleral icterus Neck: Supple, scarring present Cardiovascular: Normal heart sounds, no murmurs rubs or gallops Pulmonary/Chest: Normal work of breathing, no wheezes or rales or rhonchi Abdominal: Nondistended Neurological: Alert and oriented x3 Skin: No rashes  Assessment & Plan:   See Encounters Tab for problem based charting.  Patient seen with Dr. Dareen Piano

## 2020-02-19 NOTE — Assessment & Plan Note (Signed)
Patient's blood pressure was 93/73 today, 95/50 after repeat.  She reports she takes her lisinopril 20 mg daily.  She denies weakness, lightheadedness, dizziness at rest or upon standing.  She reports she drinks "some water" however drinks more sweet tea and coffee.   Plan:  Although the patient has not been and is not currently symptomatically hypotensive, we will stop her lisinopril 20 mg until she comes back for follow-up in 1 week.  In the meantime counseled her to increase her water intake.

## 2020-02-19 NOTE — Assessment & Plan Note (Signed)
Patient to receive her flu shot today.

## 2020-02-19 NOTE — Assessment & Plan Note (Signed)
The patient has gained 9 pounds in the last 8 months.  She reports her intake of processed and fatty foods has increased during this time.  She denies any change in her physical activity, noting that she is active on her feet for her job.  She denies fatigue, constipation, dry skin.  - Counseled the patient to increase the proportion of plant-based foods in her diet and decrease her intake of processed and/or fatty foods.

## 2020-02-19 NOTE — Patient Instructions (Signed)
Stacy Anderson,   Thank you for your visit to the [facility] today. It was a pleasure seeing you. Today we discussed the following:  1) Blood pressure: Your blood pressure was low today.  The first measurement was 93/73, and the second measurement was 95/50.  I would like for you to stop taking your lisinopril until we can have you back here for a follow-up visit in 1 week.  Until then, encourage you to drink more water and less sweet tea.  Monitor for symptoms such as dizziness or lightheadedness at rest or with standing, and if you feel any of these things do not hesitate to call us.   2) Health maintenance:  - Today you will get your flu shot.   - We will call the [facility] on [facility] to get the record of your Covid 19 vaccines. - We have ordered a test to screen for colon cancer.  Someone in the lab will give this to you and explain to you how it works.  It screens for blood in your stool.  If it is positive, the next step would be to get a colonoscopy. - We will do blood work to measure your cholesterol levels. - We will do blood work to assess your kidney function. - Definitely keep your appointment for your mammography. - We will get blood work to screen for hepatitis C.  3) Weight gain: According to our records, you have gained about 9 pounds in 8 months.  Recommend that you try to incorporate more plant-based foods into your diet and cut down on processed foods.  Also recommend that you continue to stay active and think about incorporating light exercise into your routine.   We would like to see you back in 1 week to follow-up on your blood pressure.  If you have any questions or concerns please call us at [phone].

## 2020-02-19 NOTE — Assessment & Plan Note (Signed)
Patient reports she has not gotten a colonoscopy.  She has canceled previously scheduled appointments for colonoscopy because she does not like the idea of the prep involved.  Plan:  Fecal occult blood, immunochemical testing (FIT testing) has been ordered.  Counseled patient that if this comes back positive, the next step would be a colonoscopy.

## 2020-02-19 NOTE — Assessment & Plan Note (Addendum)
  We will obtain lipid panel today.

## 2020-02-19 NOTE — Assessment & Plan Note (Signed)
Screening mammogram normal in August 2020.  Patient reports she has a mammogram scheduled in the next month.

## 2020-02-19 NOTE — Assessment & Plan Note (Signed)
We will screen for hepatitis C today.

## 2020-02-20 LAB — BMP8+ANION GAP
Anion Gap: 13 mmol/L (ref 10.0–18.0)
BUN/Creatinine Ratio: 18 (ref 9–23)
BUN: 21 mg/dL (ref 6–24)
CO2: 23 mmol/L (ref 20–29)
Calcium: 9.6 mg/dL (ref 8.7–10.2)
Chloride: 104 mmol/L (ref 96–106)
Creatinine, Ser: 1.17 mg/dL — ABNORMAL HIGH (ref 0.57–1.00)
GFR calc Af Amer: 61 mL/min/{1.73_m2} (ref >59)
GFR calc non Af Amer: 53 mL/min/{1.73_m2} — ABNORMAL LOW (ref >59)
Glucose: 90 mg/dL (ref 65–99)
Potassium: 5.1 mmol/L (ref 3.5–5.2)
Sodium: 140 mmol/L (ref 134–144)

## 2020-02-20 LAB — LIPID PANEL
Chol/HDL Ratio: 5.8 ratio — ABNORMAL HIGH (ref 0.0–4.4)
Cholesterol, Total: 256 mg/dL — ABNORMAL HIGH (ref 100–199)
HDL: 44 mg/dL (ref >39)
LDL Chol Calc (NIH): 170 mg/dL — ABNORMAL HIGH (ref 0–99)
Triglycerides: 223 mg/dL — ABNORMAL HIGH (ref 0–149)
VLDL Cholesterol Cal: 42 mg/dL — ABNORMAL HIGH (ref 5–40)

## 2020-02-20 LAB — HEPATITIS C ANTIBODY: Hep C Virus Ab: 0.1 s/co ratio (ref 0.0–0.9)

## 2020-02-24 NOTE — Progress Notes (Signed)
Internal Medicine Clinic Attending  I saw and evaluated the patient.  I personally confirmed the key portions of the history and exam documented by Dr. Watson and I reviewed pertinent patient test results.  The assessment, diagnosis, and plan were formulated together and I agree with the documentation in the resident's note.  

## 2020-02-25 DIAGNOSIS — Z1211 Encounter for screening for malignant neoplasm of colon: Secondary | ICD-10-CM | POA: Diagnosis not present

## 2020-02-26 ENCOUNTER — Encounter: Payer: Self-pay | Admitting: Student

## 2020-02-26 ENCOUNTER — Ambulatory Visit (INDEPENDENT_AMBULATORY_CARE_PROVIDER_SITE_OTHER): Payer: BLUE CROSS/BLUE SHIELD | Admitting: Student

## 2020-02-26 ENCOUNTER — Other Ambulatory Visit: Payer: Self-pay

## 2020-02-26 ENCOUNTER — Other Ambulatory Visit: Payer: Self-pay | Admitting: Student

## 2020-02-26 VITALS — BP 116/87 | HR 95 | Temp 98.6°F | Wt 198.9 lb

## 2020-02-26 DIAGNOSIS — E782 Mixed hyperlipidemia: Secondary | ICD-10-CM

## 2020-02-26 DIAGNOSIS — I10 Essential (primary) hypertension: Secondary | ICD-10-CM | POA: Diagnosis not present

## 2020-02-26 DIAGNOSIS — F17210 Nicotine dependence, cigarettes, uncomplicated: Secondary | ICD-10-CM | POA: Diagnosis not present

## 2020-02-26 DIAGNOSIS — F17200 Nicotine dependence, unspecified, uncomplicated: Secondary | ICD-10-CM | POA: Insufficient documentation

## 2020-02-26 DIAGNOSIS — Z1211 Encounter for screening for malignant neoplasm of colon: Secondary | ICD-10-CM

## 2020-02-26 MED ORDER — ROSUVASTATIN CALCIUM 10 MG PO TABS: 10.0000 mg | ORAL_TABLET | Freq: Every day | ORAL | 3 refills | Status: DC

## 2020-02-26 MED FILL — ROSUVASTATIN CALCIUM 10 MG: 10 | 90 days supply | Qty: 90 | Fill #0

## 2020-02-26 NOTE — Assessment & Plan Note (Signed)
Lipid panel obtained 1-week ago at last visit:  Cholesterol, Total 100 - 199 mg/dL 256High   Triglycerides 0 - 149 mg/dL 223High   HDL >39 mg/dL 44   VLDL Cholesterol Cal 5 - 40 mg/dL 42High   LDL Chol Calc (NIH) 0 - 99 mg/dL 170High   Chol/HDL Ratio 0.0 - 4.4 ratio 5.8High    Current 10-year ASCVD 6.6% if select not on antihypertensive, 10.5% if select on antihypertensive.   Plan: - Will initiate moderate-intensity statin, rosuvastatin 10 mg daily - Follow-up in 4-6 weeks for repeat lipid panel

## 2020-02-26 NOTE — Assessment & Plan Note (Addendum)
BP 116/87 today. She has been holding her lisinopril 20 mg over the last week as instructed at our visit 1 week ago. She bought a home BP cuff and has been measuring her pressures in the morning, ranging 115-120s/80s-90s. She denies lightheadedness or dizziness at rest or upon standing. Reports she increased her water intake from ~8 oz daily to 2-3 16-oz bottles of water daily.  Plan:  - Continue to hold lisinopril 20 - Patient instructed to continue to monitor her BP at home and let us know if her pressures rise - BMP at last visit 1-week ago with sCr 1.17 (baseline ~0.90, suspected pre-renal and/or related to lisinopril). Repeated BMP today.  ADDENDUM: - Repeat BMP with sCr improved to 0.95

## 2020-02-26 NOTE — Progress Notes (Signed)
   CC: blood pressure follow-up  HPI:  Stacy Anderson is a 54 y.o. person with history of HTN and as listed below who presents to clinic for 1-week follow-up of blood pressure.   To see the details of this patient's management of their acute and chronic problems, please refer to the A&P under the Encounters tab.    Past Medical History:  Diagnosis Date  . Anemia   . 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 Dysphagia 01/11/2015  . GERD (gastroesophageal reflux disease)   . Health care maintenance 12/20/2015  . Herpes genitalis 12/30/2008  . Hypertension   . Left carotid artery stenosis   . 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 Sensorineural hearing loss of both ears    likely secondary to the radiation therapy, chemotherapy and the vascular flow issues  . Unspecified hearing loss 05/05/2010   Annotation: bil hearing aids since age 6 - nasal cancer Qualifier: Diagnosis of  By: Hassell Done FNP, Tori Milks    . UNSPECIFIED SLEEP DISTURBANCE 05/05/2010   Qualifier: Diagnosis of  By: Hassell Done FNP, Tori Milks     Review of Systems:    Review of Systems  Respiratory: Negative for cough.   Cardiovascular: Negative for chest pain and palpitations.  Neurological: Negative for dizziness, weakness and headaches.    Physical Exam:  Vitals:   02/26/20 1107  BP: 116/87  Pulse: 95  Temp: 98.6 F (37 C)  TempSrc: Oral  SpO2: 97%  Weight: 198 lb 14.4 oz (90.2 kg)   Constitutional: Very pleasant woman sitting in chair, in no acute distress; patient is very hard of hearing HENT: Normocephalic atraumatic, hearing aids present bilaterally Eyes: Conjunctiva not erythematous, no  scleral icterus Neck: Supple, scarring present Cardiovascular: Normal heart sounds, no murmurs rubs or gallops Pulmonary/Chest: Normal work of breathing, no wheezes or rales or rhonchi Abdominal: Nondistended Neurological: Alert and oriented x3  Assessment & Plan:   See Encounters Tab for problem based charting.  Patient seen with Dr. Dareen Piano

## 2020-02-26 NOTE — Patient Instructions (Signed)
Ms. Semidey,   Thank you for your visit to the [facility] today. It was a pleasure seeing you. Today we discussed the following:  1) Blood pressure - Your blood pressure was good today. Continue with NOT taking your lisinopril. - Continue monitoring your blood pressure at home. Give [place] a call if your pressures rise.  2) Elevated cholesterol - We are starting you on a medicine to lower your cholesterol, rosuvastatin. - Let us know if you experience any adverse effects from this medicine. - We will measure your cholesterol levels at your next visit.   We would like to see you back in 4-6 weeks for follow-up of your cholesterol and blood pressure.   If you have any questions or concerns, please call our clinic at [phone] between 9am-5pm. Outside of these hours, call [phone] and ask for the internal medicine resident on call. If you feel you are having a medical emergency please call 911.

## 2020-02-26 NOTE — Assessment & Plan Note (Signed)
Patient reports she is still smoking cigarettes "on occasion." Counseled her regarding benefits of quitting altogether.   Plan: - Revisit smoking cessation at next visit

## 2020-02-27 LAB — BMP8+ANION GAP
Anion Gap: 15 mmol/L (ref 10.0–18.0)
BUN/Creatinine Ratio: 22 (ref 9–23)
BUN: 21 mg/dL (ref 6–24)
CO2: 22 mmol/L (ref 20–29)
Calcium: 9.6 mg/dL (ref 8.7–10.2)
Chloride: 106 mmol/L (ref 96–106)
Creatinine, Ser: 0.95 mg/dL (ref 0.57–1.00)
GFR calc Af Amer: 79 mL/min/{1.73_m2} (ref >59)
GFR calc non Af Amer: 69 mL/min/{1.73_m2} (ref >59)
Glucose: 78 mg/dL (ref 65–99)
Potassium: 4.8 mmol/L (ref 3.5–5.2)
Sodium: 143 mmol/L (ref 134–144)

## 2020-02-27 LAB — FECAL OCCULT BLOOD, IMMUNOCHEMICAL: Fecal Occult Bld: NEGATIVE

## 2020-03-01 NOTE — Progress Notes (Signed)
Internal Medicine Clinic Attending  I saw and evaluated the patient.  I personally confirmed the key portions of the history and exam documented by Dr. Watson and I reviewed pertinent patient test results.  The assessment, diagnosis, and plan were formulated together and I agree with the documentation in the resident's note.  

## 2020-03-04 DIAGNOSIS — Z87891 Personal history of nicotine dependence: Secondary | ICD-10-CM | POA: Diagnosis not present

## 2020-03-04 DIAGNOSIS — Z483 Aftercare following surgery for neoplasm: Secondary | ICD-10-CM | POA: Diagnosis not present

## 2020-03-04 DIAGNOSIS — C01 Malignant neoplasm of base of tongue: Secondary | ICD-10-CM | POA: Diagnosis not present

## 2020-03-04 DIAGNOSIS — R4921 Hypernasality: Secondary | ICD-10-CM | POA: Diagnosis not present

## 2020-03-04 DIAGNOSIS — Z8589 Personal history of malignant neoplasm of other organs and systems: Secondary | ICD-10-CM | POA: Diagnosis not present

## 2020-03-04 DIAGNOSIS — Z85818 Personal history of malignant neoplasm of other sites of lip, oral cavity, and pharynx: Secondary | ICD-10-CM | POA: Diagnosis not present

## 2020-03-04 DIAGNOSIS — Z85810 Personal history of malignant neoplasm of tongue: Secondary | ICD-10-CM | POA: Diagnosis not present

## 2020-03-04 DIAGNOSIS — J302 Other seasonal allergic rhinitis: Secondary | ICD-10-CM | POA: Diagnosis not present

## 2020-03-09 ENCOUNTER — Telehealth: Payer: Self-pay | Admitting: Student

## 2020-03-09 NOTE — Telephone Encounter (Signed)
Pt requesting a call back about her Test Results for his Colon/Fit Test.

## 2020-03-09 NOTE — Telephone Encounter (Signed)
Called patient and left VM indicating negative result.

## 2020-03-10 ENCOUNTER — Other Ambulatory Visit: Payer: Self-pay | Admitting: Student in an Organized Health Care Education/Training Program

## 2020-03-10 DIAGNOSIS — Z1231 Encounter for screening mammogram for malignant neoplasm of breast: Secondary | ICD-10-CM

## 2020-03-16 ENCOUNTER — Telehealth: Payer: Self-pay

## 2020-03-16 NOTE — Telephone Encounter (Signed)
Received TC from patient and her son, West Carbo.  States that patient's b/p has been running consistently high this week.  RN ask to report b/p's, patient and son report  176/22 right arm, then change this to 176/122 Report 97/88 in left arm and then change and then report 97/22.  Son states mother had some of her tongue removed because of cancer and it is difficult to understand her, he is trying to interpret the values.  Patient denies any H/A, chest pain, SOB, slurred speech, or CNS disturbances.  She does c/o intermittently feeling light headed, but not when walking.  However, she is a poor historian.  Pt given an appt for tomorrow w/ PCP @ 1100 for b/p check and possible orthostatic b/p's.  They were advised if patient developed severe h/a, chest pain, SOB, slurred speech, visual disturbances, or feeling faint to go to the ED. Thank you, SChaplin, RN,BSN

## 2020-03-17 ENCOUNTER — Ambulatory Visit (INDEPENDENT_AMBULATORY_CARE_PROVIDER_SITE_OTHER): Payer: BLUE CROSS/BLUE SHIELD | Admitting: Student

## 2020-03-17 ENCOUNTER — Encounter: Payer: Self-pay | Admitting: Student

## 2020-03-17 VITALS — BP 93/40 | HR 87 | Temp 98.1°F | Ht 67.0 in | Wt 200.9 lb

## 2020-03-17 DIAGNOSIS — I10 Essential (primary) hypertension: Secondary | ICD-10-CM | POA: Diagnosis not present

## 2020-03-17 DIAGNOSIS — Z1211 Encounter for screening for malignant neoplasm of colon: Secondary | ICD-10-CM | POA: Diagnosis not present

## 2020-03-17 DIAGNOSIS — I998 Other disorder of circulatory system: Secondary | ICD-10-CM | POA: Diagnosis not present

## 2020-03-17 DIAGNOSIS — Z1159 Encounter for screening for other viral diseases: Secondary | ICD-10-CM

## 2020-03-17 MED ORDER — LISINOPRIL 5 MG PO TABS: 5.0000 mg | ORAL_TABLET | Freq: Every day | ORAL | 0 refills | Status: DC

## 2020-03-17 MED FILL — LISINOPRIL 5 MG TABS: 5 | 90 days supply | Qty: 90 | Fill #0

## 2020-03-17 NOTE — Assessment & Plan Note (Addendum)
Patient presents today after her son West Carbo) called the clinic yesterday reporting that the patient patient's BP has been running consistently high this week and noted a discrepancy between the measurements obtained in the R and L arms. She denied HA, CP, SOB. As the son reported a wide range of blood pressures, triage nursing scheduled the patient to follow-up in person today.  Today, BP measured in the R arm 143/96 and in the L arm 93/40.  On evaluation, the patient reiterates that she has been "feeling fine." She denies headache, dizziness, lightheadedness, chest pain, shortness of breath. Does endorse occasionally feeling cold and tingling/paresthesias and weakness in her L arm/hand. Exam significant for 3+ R radial pulse and 1+ L radial pulse. The patient raising both arms straight above her head does not reproduce her symptoms, however upon releasing tight fists with arms raised above head, the L hand is slower to perfuse. No bruits noted on exam. L side collarbone does appear bulkier than the R side, and L SCM is tight in appearance.  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 Will order L upper extremity arterial doppler and refer to vascular surgery. Will treat hypertension of R arm with starting dose of lisinopril 5 mg (patient previously on 20 mg). - L upper extremity arterial doppler - referral to vascular surgery - start lisinopril 5 mg daily for HTN

## 2020-03-17 NOTE — Patient Instructions (Addendum)
Stacy Anderson,   Thank you for your visit to the [facility] today. It was a pleasure seeing you again. Today we discussed the following:  1) Asymmetric blood pressure - Your blood pressure was 142/93 on the R, and 93/40 on the L. - This as well as the symptoms you described in your left arm/hand raise concern that you may have impaired blood flow to your L arm, possibly due to the radiation therapy you had previously. - In order to investigate this further, we have ordered an ultrasound of your L upper extremity. - I have also placed a referral to vascular surgery. Someone will call you to schedule an appointment.  - From here on, we will use your R arm to measure your blood pressure. So, your pressure of 142/93 is above goal of <130/80. For this reason, we will start you on your old blood pressure medicine but start at a lower dose: lisinopril 5 mg daily. - Check the blood pressure in your R arm once daily in the morning and record these so we can go over them at your next appointment.  We would like to see you back in 2 weeks for a blood pressure check.   In the meantime, if you have any questions or concerns, please call our clinic at [phone] between 9am-5pm. Outside of these hours, call [phone] and ask for the internal medicine resident on call. If you feel you are having a medical emergency please call 911.    HOW TO TAKE YOUR BLOOD PRESSURE:  Rest 5 minutes before taking your blood pressure.  Don't smoke or drink caffeinated beverages for at least 30 minutes before.  Take your blood pressure before (not after) you eat.  Sit comfortably with your back supported and both feet on the floor (don't cross your legs).  Elevate your arm to heart level on a table or a desk.  Use the proper sized cuff. It should fit smoothly and snugly around your bare upper arm. There should be enough room to slip a fingertip under the cuff. The bottom edge of the cuff should  be 1 inch above the crease of the elbow.  Ideally, take 3 measurements at one sitting and record the average.

## 2020-03-17 NOTE — Assessment & Plan Note (Signed)
Today, BP measured in the R arm 143/96 and in the L arm 93/40.  Plan: - start lisinopril 5 mg daily - Patient's BP should only be measured on the R side

## 2020-03-17 NOTE — Progress Notes (Signed)
   CC: "The blood pressures in my arms are different."  HPI:  Stacy Anderson is a very pleasant 54 y.o. 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 Her last clinic visit was on 02/26/20.   To see the details of this patient's management of their acute and chronic problems, please refer to the Assessment & Plan under the Encounters tab.    Past Medical History:  Diagnosis Date  . Anemia   . 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 Dysphagia 01/11/2015  . GERD (gastroesophageal reflux disease)   . Health care maintenance 12/20/2015  . Herpes genitalis 12/30/2008  . Hypertension   . Left carotid artery stenosis   . 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 Sensorineural hearing loss of both ears    likely secondary to the radiation therapy, chemotherapy and the vascular flow issues  . Unspecified hearing loss 05/05/2010   Annotation: bil hearing aids since age 80 - nasal cancer Qualifier: Diagnosis of  By: Hassell Done FNP, Tori Milks    . UNSPECIFIED SLEEP DISTURBANCE 05/05/2010   Qualifier: Diagnosis of  By: Hassell Done FNP, Tori Milks     Review of Systems:    Review of Systems  Constitutional: Negative for chills, fever and malaise/fatigue.  HENT: Negative for ear pain.   Eyes: Negative for blurred vision.  Respiratory: Negative for cough and shortness of breath.   Cardiovascular: Negative for chest pain, palpitations and leg swelling.  Gastrointestinal: Negative for abdominal pain, nausea and vomiting.  Musculoskeletal: Negative for myalgias.   Neurological: Negative for dizziness, focal weakness and headaches.    Physical Exam:  Vitals:   03/17/20 1113 03/17/20 1202 03/17/20 1203  BP: (!) 142/93 (!) 143/96 (!) 93/40  Pulse: 95 88 87  Temp: 98.1 F (36.7 C)    TempSrc: Oral    SpO2: 99%    Weight: 200 lb 14.4 oz (91.1 kg)    Height: 5\' 7"  (1.702 m)     BP in R arm: 143/96 BP in L arm: 93/40  Constitutional: well-appearing woman sitting in chair, in no acute distress, patient is very hard of hearing HENT: normocephalic atraumatic, mucous membranes moist, hearing aids bilaterally Eyes: conjunctiva non-erythematous Neck: supple Cardiovascular: regular rate and rhythm, no m/r/g; 3+ R radial pulse and 1+ L radial pulse. Raising both arms straight above head does not reproduce symptoms, however upon releasing tight fists with arms raised above head, the L hand is slower to perfuse. No carotid or subclavian bruits. Pulmonary/Chest: normal work of breathing on room air, lungs clear to auscultation bilaterally Abdominal: soft, non-distended MSK: normal bulk and tone; Skin overlying L SCM appears tight/tense; L clavicle bulkier than the R clavicle Neurological: alert & oriented x 3, normal gait   Assessment & Plan:   See Encounters Tab for problem based charting.  Patient seen with Dr. Jimmye Norman

## 2020-03-18 NOTE — Progress Notes (Signed)
DOS 03/17/20:  Internal Medicine Clinic Attending  I saw and evaluated the patient.  I personally confirmed the key portions of the history and exam documented by Dr. Shon Baton and I reviewed pertinent patient test results.  The assessment, diagnosis, and plan were formulated together and I agree with the documentation in the resident's note. No bruits auscultated over the subclavian.  There are chronic post-radiation changes of soft tissue and clavicle visible over the base of the L anterior and lateral neck.

## 2020-03-31 ENCOUNTER — Other Ambulatory Visit: Payer: Self-pay

## 2020-03-31 ENCOUNTER — Ambulatory Visit
Admission: RE | Admit: 2020-03-31 | Discharge: 2020-03-31 | Disposition: A | Discharge status: Home / Self Care | Payer: BLUE CROSS/BLUE SHIELD | Source: Ambulatory Visit | Attending: Student in an Organized Health Care Education/Training Program | Admitting: Student in an Organized Health Care Education/Training Program

## 2020-03-31 DIAGNOSIS — Z1231 Encounter for screening mammogram for malignant neoplasm of breast: Secondary | ICD-10-CM

## 2020-04-02 ENCOUNTER — Other Ambulatory Visit (HOSPITAL_COMMUNITY): Payer: BLUE CROSS/BLUE SHIELD

## 2020-04-02 ENCOUNTER — Ambulatory Visit (HOSPITAL_COMMUNITY)
Admission: RE | Admit: 2020-04-02 | Discharge: 2020-04-02 | Disposition: A | Discharge status: Home / Self Care | Payer: Self-pay | Source: Ambulatory Visit | Attending: Internal Medicine | Admitting: Internal Medicine

## 2020-04-02 ENCOUNTER — Other Ambulatory Visit: Payer: Self-pay

## 2020-04-02 DIAGNOSIS — I998 Other disorder of circulatory system: Secondary | ICD-10-CM | POA: Insufficient documentation

## 2020-04-02 NOTE — Progress Notes (Addendum)
Upper extremity arterial duplex has been completed. Preliminary results can be found in CV Proc through chart review.   04/02/20 3:37 PM Stacy Anderson RVT - for [place]

## 2020-04-12 ENCOUNTER — Encounter: Payer: Self-pay | Admitting: *Deleted

## 2020-05-20 NOTE — Addendum Note (Signed)
Addended by: Hulan Fray on: 05/20/2020 05:35 PM   Modules accepted: Orders

## 2020-05-26 ENCOUNTER — Other Ambulatory Visit: Payer: Self-pay | Admitting: Internal Medicine

## 2020-05-26 ENCOUNTER — Other Ambulatory Visit: Payer: Self-pay | Admitting: Student

## 2020-05-26 MED FILL — LISINOPRIL 5 MG TABS: 5 | 90 days supply | Qty: 90 | Fill #0

## 2020-05-26 MED FILL — ROSUVASTATIN CALCIUM 10 MG: 10 | 90 days supply | Qty: 90 | Fill #1

## 2020-08-26 MED FILL — ROSUVASTATIN CALCIUM 10 MG: 10 | 90 days supply | Qty: 90 | Fill #2

## 2020-08-27 ENCOUNTER — Ambulatory Visit (INDEPENDENT_AMBULATORY_CARE_PROVIDER_SITE_OTHER): Payer: PRIVATE HEALTH INSURANCE | Admitting: Student

## 2020-08-27 ENCOUNTER — Other Ambulatory Visit: Payer: Self-pay | Admitting: Student

## 2020-08-27 ENCOUNTER — Encounter: Payer: Self-pay | Admitting: Student

## 2020-08-27 VITALS — BP 75/24 | HR 69 | Wt 194.3 lb

## 2020-08-27 DIAGNOSIS — Z124 Encounter for screening for malignant neoplasm of cervix: Secondary | ICD-10-CM | POA: Insufficient documentation

## 2020-08-27 DIAGNOSIS — I10 Essential (primary) hypertension: Secondary | ICD-10-CM | POA: Diagnosis not present

## 2020-08-27 DIAGNOSIS — E782 Mixed hyperlipidemia: Secondary | ICD-10-CM

## 2020-08-27 DIAGNOSIS — F17200 Nicotine dependence, unspecified, uncomplicated: Secondary | ICD-10-CM

## 2020-08-27 DIAGNOSIS — I998 Other disorder of circulatory system: Secondary | ICD-10-CM | POA: Diagnosis not present

## 2020-08-27 MED ORDER — ROSUVASTATIN CALCIUM 10 MG PO TABS: 10.0000 mg | ORAL_TABLET | Freq: Every day | ORAL | 3 refills | Status: DC

## 2020-08-27 MED ORDER — LISINOPRIL 5 MG PO TABS: 5.0000 mg | ORAL_TABLET | Freq: Every day | ORAL | 0 refills | Status: DC

## 2020-08-27 MED FILL — LISINOPRIL 5 MG TABS: 5 | 90 days supply | Qty: 90 | Fill #0

## 2020-08-27 NOTE — Assessment & Plan Note (Addendum)
Patient reports she has been tolerating rosuvastatin 10 mg daily well since starting it 6 months ago.   Plan: - Repeat lipid panel -> much improved, total cholesterol 158, triglycerides 108, HDL 55, LDL 83 - CMP today -> within normal limits

## 2020-08-27 NOTE — Assessment & Plan Note (Addendum)
Today, BP measured in the R arm 93/54 and in the L arm 75/24. Patient reports she checks her BP at home daily and average measurements are 130s-140s/70s-90s, including this morning before this appointment. She denies dizziness, lightheadedness, headache, blurry vision. States, "I feel completely fine."  See problem-based assessment and plan 'asymmetric blood pressures.'   Plan: - continue lisinopril 5 mg daily  - Patient provided with blood pressure log sheet and instructed to measure her BP daily (instructions provided) and follow-up in clinic with the log as well as her home BP cuff - CMP today -> unremarkable, Na 142, K 5.1, sCr 0.92, AST/ALT/ALP 17/10/72

## 2020-08-27 NOTE — Assessment & Plan Note (Signed)
Patient presents for BP follow-up.   After her last clinic visit on 03/17/20 she went for upper extremity dopplers to assess for possible subclavian artery stenosis. This study was within normal limits.  She reports she has only been measuring her BP at home on the R side as instructed at last clinic visit. She states she is doing well overall, without dizziness, lightheadedness, blurry vision, lower extremity swelling.   Physical exam consistent with last visit with 3+ R radial pulse and 1+ L radial pulse.  Assessment/Plan: Though the patient's upper extremity doppler was negative for stenosis, given the patient's radiation history, stable exam findings, and asymmetric BP, it is possible she has a stenosis which is proximal to the study. Since she is asymptomatic and her home BP measurements are higher than today's clinic measurements, will have patient monitor home BP and return for follow-up at the end of the month ~2 weeks for BP check. - home BP measurement - continue lisinopril 5 mg daily

## 2020-08-27 NOTE — Progress Notes (Signed)
   CC: follow-up on HTN, HLD  HPI:  [name] is a 55 y.o. woman with history as below who presents to clinic for follow-up of her chronic medical conditions. Her last clinic visit was on 03/17/20.   To see the details of this patient's management of their acute and chronic problems, please refer to the Assessment & Plan under the Encounters tab.    Past Medical History:  Diagnosis Date  . Anemia   . 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 Dysphagia 01/11/2015  . GERD (gastroesophageal reflux disease)   . Health care maintenance 12/20/2015  . Herpes genitalis 12/30/2008  . Hypertension   . Left carotid artery stenosis   . 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 Sensorineural hearing loss of both ears    likely secondary to the radiation therapy, chemotherapy and the vascular flow issues  . Unspecified hearing loss 05/05/2010   Annotation: bil hearing aids since age 60 - nasal cancer Qualifier: Diagnosis of  By: Hassell Done FNP, Tori Milks    . UNSPECIFIED SLEEP DISTURBANCE 05/05/2010   Qualifier: Diagnosis of  By: Hassell Done FNP, Tori Milks     Review of Systems:    Review of Systems  Constitutional: Negative for chills, fever and malaise/fatigue.  Eyes: Negative for blurred vision.  Respiratory: Negative for shortness of breath.   Cardiovascular: Negative for chest pain, palpitations and leg swelling.  Neurological: Negative for dizziness, weakness and headaches.    Physical Exam:  Vitals:   08/27/20 0936 08/27/20 0937  BP: (!) 93/54 (!) 75/24  Pulse: 92 69  SpO2: 100%   Weight: 194 lb 4.8 oz (88.1 kg)    Constitutional: well-appearing woman sitting in  chair, in no acute distress, patient is very hard of hearing HENT: normocephalic atraumatic, mucous membranes moist, hearing aids bilaterally Eyes: conjunctiva non-erythematous Neck: supple Cardiovascular: regular rate and rhythm, no m/r/g; 3+ R radial pulse and 1+ L radial pulse. Raising both arms straight above head does not reproduce symptoms, however upon releasing tight fists with arms raised above head, the L hand is slower to perfuse. No carotid or subclavian bruits. Pulmonary/Chest: normal work of breathing on room air, lungs clear to auscultation bilaterally Abdominal: soft, non-distended MSK: normal bulk and tone Neurological: alert & oriented x 3, normal gait   Assessment & Plan:   See Encounters Tab for problem-based charting.  Patient discussed with Dr. Dareen Piano

## 2020-08-27 NOTE — Assessment & Plan Note (Signed)
Patient had negative co-testing on 04/06/26. Patient will be due for pap smear with co-testing on or after 04/06/21.

## 2020-08-27 NOTE — Assessment & Plan Note (Signed)
Patient reports she is only smoking cigars "on occasion." Provided further counseling regarding benefits of quitting.  Plan: - Continue to address at subsequent visits

## 2020-08-27 NOTE — Patient Instructions (Addendum)
Stacy Anderson,   Thank you for your visit to the [facility] today. It is always a pleasure seeing you! Today we discussed the following:  1) Blood pressure - Your blood pressures continue to be asymmetric in your arms. The test we had you do to evaluate for possible cause was negative, so we will just continue to monitor. - I would like you to measure and record your blood pressure daily until I see you at the end of the month. Bring this record with you! Call the office if you develop any lightheadedness, dizziness, blurry vision, lower extremity swelling. - I refilled your lisinopril 5 mg   HOW TO TAKE YOUR BLOOD PRESSURE:  Rest 5 minutes before taking your blood pressure.  Don't smoke or drink caffeinated beverages for at least 30 minutes before.  Take your blood pressure before (not after) you eat.  Sit comfortably with your back supported and both feet on the floor (don't cross your legs).  Elevate your arm to heart level on a table or a desk.  Use the proper sized cuff. It should fit smoothly and snugly around your bare upper arm. There should be enough room to slip a fingertip under the cuff. The bottom edge of the cuff should be 1 inch above the crease of the elbow.  Ideally, take 3 measurements at one sitting and record the average.  2) High cholesterol - I am repeating your lipid panel to see how the rosuvastin 10 mg is doing for your cholesterol. I will call you with the result. - I refilled rosuvastatin 10 mg   3) Pap smear  - You are due for your pap smear. We will do this at your next appointment.  4) Continue working on cutting back on your cigar smoking.   We would like to see you back at the end of this month for pap smear and BP check. Please bring your blood pressure log with you.  Take care!  If you have any questions or concerns, please call our clinic at [phone] between 9am-5pm. Outside of these hours, call [phone] and ask  for the internal medicine resident on call. If you feel you are having a medical emergency please call 911.

## 2020-08-28 LAB — CMP14 + ANION GAP
ALT: 10 IU/L (ref 0–32)
AST: 17 IU/L (ref 0–40)
Albumin/Globulin Ratio: 1.6 (ref 1.2–2.2)
Albumin: 4.1 g/dL (ref 3.8–4.9)
Alkaline Phosphatase: 72 IU/L (ref 44–121)
Anion Gap: 15 mmol/L (ref 10.0–18.0)
BUN/Creatinine Ratio: 20 (ref 9–23)
BUN: 18 mg/dL (ref 6–24)
Bilirubin Total: 0.3 mg/dL (ref 0.0–1.2)
CO2: 20 mmol/L (ref 20–29)
Calcium: 9.5 mg/dL (ref 8.7–10.2)
Chloride: 107 mmol/L — ABNORMAL HIGH (ref 96–106)
Creatinine, Ser: 0.92 mg/dL (ref 0.57–1.00)
Globulin, Total: 2.6 g/dL (ref 1.5–4.5)
Glucose: 84 mg/dL (ref 65–99)
Potassium: 5.1 mmol/L (ref 3.5–5.2)
Sodium: 142 mmol/L (ref 134–144)
Total Protein: 6.7 g/dL (ref 6.0–8.5)
eGFR: 74 mL/min/{1.73_m2} (ref >59)

## 2020-08-28 LAB — LIPID PANEL
Chol/HDL Ratio: 2.9 ratio (ref 0.0–4.4)
Cholesterol, Total: 158 mg/dL (ref 100–199)
HDL: 55 mg/dL (ref >39)
LDL Chol Calc (NIH): 83 mg/dL (ref 0–99)
Triglycerides: 108 mg/dL (ref 0–149)
VLDL Cholesterol Cal: 20 mg/dL (ref 5–40)

## 2020-08-30 NOTE — Progress Notes (Signed)
Internal Medicine Clinic Attending  Case discussed with Dr. Watson  At the time of the visit.  We reviewed the resident's history and exam and pertinent patient test results.  I agree with the assessment, diagnosis, and plan of care documented in the resident's note.  

## 2020-09-15 ENCOUNTER — Other Ambulatory Visit: Payer: Self-pay | Admitting: Student

## 2020-09-15 ENCOUNTER — Ambulatory Visit (INDEPENDENT_AMBULATORY_CARE_PROVIDER_SITE_OTHER): Payer: PRIVATE HEALTH INSURANCE | Admitting: Student

## 2020-09-15 DIAGNOSIS — I998 Other disorder of circulatory system: Secondary | ICD-10-CM

## 2020-09-15 DIAGNOSIS — I10 Essential (primary) hypertension: Secondary | ICD-10-CM

## 2020-09-15 MED ORDER — LISINOPRIL 5 MG PO TABS: 10.0000 mg | ORAL_TABLET | Freq: Every day | ORAL | 2 refills | Status: DC

## 2020-09-15 NOTE — Progress Notes (Signed)
   CC: HTN follow-up  HPI:  Ms.Danella Jerilynn Mages Stach is a 55 y.o. woman with history as below who presents to clinic for HTN follow-up. Her last clinic visit was on 08/27/20.   To see the details of this patient's management of their acute and chronic problems, please refer to the Assessment & Plan under the Encounters tab.    Past Medical History:  Diagnosis Date  . Anemia   . 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 Dysphagia 01/11/2015  . GERD (gastroesophageal reflux disease)   . Health care maintenance 12/20/2015  . Herpes genitalis 12/30/2008  . Hypertension   . Left carotid artery stenosis   . 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 Sensorineural hearing loss of both ears    likely secondary to the radiation therapy, chemotherapy and the vascular flow issues  . Unspecified hearing loss 05/05/2010   Annotation: bil hearing aids since age 80 - nasal cancer Qualifier: Diagnosis of  By: Hassell Done FNP, Tori Milks    . UNSPECIFIED SLEEP DISTURBANCE 05/05/2010   Qualifier: Diagnosis of  By: Hassell Done FNP, Tori Milks     Review of Systems:    Review of Systems  Constitutional: Negative for malaise/fatigue.  Eyes: Negative for blurred vision.  Respiratory: Negative for shortness of breath.   Cardiovascular: Negative for chest pain, palpitations and leg swelling.  Gastrointestinal: Negative for abdominal pain.  Musculoskeletal: Negative for neck pain.  Neurological: Negative for dizziness, focal weakness, weakness and headaches.    Physical Exam:  Vitals:   09/15/20 1531 09/15/20 1553  BP: (!) 188/115 (!) 114/93  Pulse: 81 80  Temp: 98.4 F (36.9 C)   TempSrc: Oral    SpO2: 100%   Weight: 192 lb 3.2 oz (87.2 kg)    Constitutional: very pleasant, well-appearingwomansitting in chair, in no acute distress, patient is very hard of hearing HENT: normocephalic atraumatic, mucous membranes moist, hearing aids bilaterally Eyes: conjunctiva non-erythematous Neck: supple Cardiovascular: regular rate and rhythm, no m/r/g, no lower extremity edema Pulmonary/Chest: normal work of breathing on room air, lungs clear to auscultation bilaterally Abdominal: soft, non-distended MSK: normal bulk and tone Neurological: alert & oriented x 3, normal gait   Assessment & Plan:   See Encounters Tab for problem-based charting.  Patient discussed with Dr. Heber [place]

## 2020-09-15 NOTE — Patient Instructions (Addendum)
[name],   Thank you for your visit to the [facility] today. It was a pleasure seeing you. Today we discussed the following:  1) Hypertension - please increase your lisinopril to 10 mg daily - I will call radiology to see what imaging study I can order to further evaluate the difference in blood pressure between your arms - Continue to monitor your pressure at home and let us know if you develop symptoms or develop very high or low blood pressure  I would like to see you back in early June 2022. Please bring all of your medications with you.   If you have any questions or concerns, please call our clinic at [phone] between 9am-5pm. Outside of these hours, call [phone] and ask for the internal medicine resident on call. If you feel you are having a medical emergency please call 911.

## 2020-09-16 ENCOUNTER — Encounter: Payer: Self-pay | Admitting: Student

## 2020-09-16 NOTE — Assessment & Plan Note (Signed)
This is follow-up for HTN. Upon further review of patient's upper extremity duplex performed on 04/02/20 to evaluate for possible upper extremity stenosis, note that result demonstrates triphasic flow in the R doppler and monophasic flow in the L doppler. The LUE is the side with lower BP, weaker pulse, and slower perfusion.  Plan: Will discuss with radiologist to determine the best study to further evaluate the patient's asymmetric blood pressures and difference in flow between the left and right upper extremities. I suspect she may have more proximal stenosis which is affecting her BP on the left. - Radiology study TBD following discussion with radiologist - Increase lisinopril to 10 mg daily

## 2020-09-16 NOTE — Assessment & Plan Note (Signed)
Patient presents for BP follow-up after clinic measurements were softer than normal. Patient reports her home BP measurements have averaged SBP 150s-160s at home. This morning she recorded BP 164/111. In office measurement 188/115 today. She continues to report feeling well, denies dizziness, headache, lightheadedness, blurry vision.  Plan: Given her persistently elevated home BP measurements, will increase lisinopril back to 10 mg daily and have patient follow-up in 2 months. In the meantime, will obtain additional imaging to further evaluate the blood pressure difference in her arms. See problem-based A&P for asymmetric blood pressures. - increase lisinopril to 10 mg daily

## 2020-09-18 NOTE — Progress Notes (Signed)
Internal Medicine Clinic Attending  Case discussed with Dr. Watson  At the time of the visit.  We reviewed the resident's history and exam and pertinent patient test results.  I agree with the assessment, diagnosis, and plan of care documented in the resident's note.  

## 2020-09-27 ENCOUNTER — Other Ambulatory Visit (HOSPITAL_COMMUNITY): Payer: Self-pay

## 2020-09-27 MED FILL — Rosuvastatin Calcium Tab 10 MG: ORAL | 90 days supply | Qty: 90 | Fill #0 | Status: CN

## 2020-09-29 ENCOUNTER — Other Ambulatory Visit (HOSPITAL_COMMUNITY): Payer: Self-pay

## 2020-09-29 MED FILL — Rosuvastatin Calcium Tab 10 MG: ORAL | 30 days supply | Qty: 30 | Fill #0 | Status: CN

## 2020-10-07 ENCOUNTER — Other Ambulatory Visit (HOSPITAL_COMMUNITY): Payer: Self-pay

## 2020-10-25 ENCOUNTER — Other Ambulatory Visit (HOSPITAL_COMMUNITY): Payer: Self-pay

## 2020-10-25 MED FILL — Lisinopril Tab 5 MG: ORAL | 30 days supply | Qty: 60 | Fill #0 | Status: AC

## 2020-10-26 ENCOUNTER — Other Ambulatory Visit (HOSPITAL_COMMUNITY): Payer: Self-pay

## 2020-11-13 ENCOUNTER — Encounter: Payer: Self-pay | Admitting: *Deleted

## 2020-11-22 ENCOUNTER — Other Ambulatory Visit (HOSPITAL_COMMUNITY): Payer: Self-pay

## 2020-11-22 MED FILL — Lisinopril Tab 5 MG: ORAL | 30 days supply | Qty: 60 | Fill #1 | Status: AC

## 2020-11-23 ENCOUNTER — Other Ambulatory Visit (HOSPITAL_COMMUNITY): Payer: Self-pay

## 2020-11-23 MED ORDER — ROSUVASTATIN CALCIUM 10 MG PO TABS
10.0000 mg | ORAL_TABLET | Freq: Every day | ORAL | 3 refills | Status: DC
  Filled 2020-11-23: qty 90, 90d supply, fill #0
  Filled 2021-02-17: qty 90, 90d supply, fill #1
  Filled 2021-05-19: qty 90, 90d supply, fill #2
  Filled 2021-08-18: qty 90, 90d supply, fill #3

## 2020-12-27 ENCOUNTER — Other Ambulatory Visit (HOSPITAL_COMMUNITY): Payer: Self-pay

## 2020-12-27 MED FILL — Lisinopril Tab 5 MG: ORAL | 30 days supply | Qty: 60 | Fill #2 | Status: AC

## 2021-02-04 ENCOUNTER — Other Ambulatory Visit (HOSPITAL_COMMUNITY): Payer: Self-pay

## 2021-02-17 ENCOUNTER — Other Ambulatory Visit: Payer: Self-pay

## 2021-02-17 ENCOUNTER — Other Ambulatory Visit (HOSPITAL_COMMUNITY): Payer: Self-pay

## 2021-02-18 ENCOUNTER — Other Ambulatory Visit: Payer: Self-pay | Admitting: Student

## 2021-02-18 ENCOUNTER — Other Ambulatory Visit (HOSPITAL_COMMUNITY): Payer: Self-pay

## 2021-02-18 MED ORDER — LISINOPRIL 5 MG PO TABS
10.0000 mg | ORAL_TABLET | Freq: Every day | ORAL | 2 refills | Status: DC
Start: 2021-02-18 — End: 2021-04-25
  Filled 2021-02-18: qty 60, 30d supply, fill #0
  Filled 2021-03-28: qty 60, 30d supply, fill #1
  Filled 2021-04-25: qty 60, 30d supply, fill #2

## 2021-02-18 NOTE — Telephone Encounter (Signed)
Patient need follow up appointment for BP

## 2021-02-22 ENCOUNTER — Other Ambulatory Visit (HOSPITAL_COMMUNITY): Payer: Self-pay

## 2021-02-24 ENCOUNTER — Encounter: Payer: Self-pay | Admitting: Student

## 2021-03-10 ENCOUNTER — Ambulatory Visit (INDEPENDENT_AMBULATORY_CARE_PROVIDER_SITE_OTHER): Payer: Self-pay | Admitting: Internal Medicine

## 2021-03-10 VITALS — BP 143/91 | HR 77 | Temp 98.1°F | Wt 189.4 lb

## 2021-03-10 DIAGNOSIS — I10 Essential (primary) hypertension: Secondary | ICD-10-CM

## 2021-03-10 DIAGNOSIS — Z23 Encounter for immunization: Secondary | ICD-10-CM

## 2021-03-10 DIAGNOSIS — Z0000 Encounter for general adult medical examination without abnormal findings: Secondary | ICD-10-CM

## 2021-03-10 NOTE — Patient Instructions (Signed)
Thank you, [name] for allowing [place] to provide your care today. Today we discussed: Blood pressure: I am so sorry to hear about the loss of your husband. I understand this is a quite stressful time for you and I am glad you have a good support system around you during this time. Your blood pressure changes could be because of the recent stresses in your life, so we will recheck this in 1 month to keep a close eye on it. In the mean time, we will check some blood work today. Please also take your lisinopril 5 mg twice a day (1 tablet in the morning, 1 tablet in the evening) as opposed to both tablets at the same time, so we can see if this helps keep your BP stable.  I have ordered the following labs for you:   Lab Orders         BMP8+Anion Gap       Referrals ordered today:   Referral Orders  No referral(s) requested today     I have ordered the following medication/changed the following medications:    Start the following medications: No orders of the defined types were placed in this encounter.    Follow up:  1 month     Remember: blood pressure check  Should you have any questions or concerns please call the internal medicine clinic at [phone].     Buddy Duty, D.O. [place]

## 2021-03-10 NOTE — Assessment & Plan Note (Signed)
BP Readings from Last 3 Encounters:  03/10/21 (!) 143/91  09/15/20 (!) 114/93  08/27/20 (!) 75/24   The patient states that her blood pressure has been fluctuating recently checked out today.  She states that last night her systolic blood pressure was in the 180s, and this morning her systolic blood pressure was in the 90s.  During both of these episodes the patient stated that she felt fine.  Specifically the patient denies any headache, lightheadedness, dizziness, chest pain, palpitations, shortness of breath, or any other symptoms at this time.  The patient did note that her husband died last week and she has been stressed because of this, especially because they are unsure why he passed away period.  She states that she does have a lot of support at home and she has her sisters in the area to help her out during this time.  Overall she appears to be coping well. We discussed that the recent stresses of her life could account for the fluctuations with her blood pressure and that we will keep a close eye on the readings and recheck in about 1 month.  Advised the patient that if her blood pressure remains elevated and if she begins to develop any headaches, blurry vision, chest pain, or palpitations that she should go to the nearest emergency department.  Plan: - BMP today - Advised patient to split up her lisinopril dose to 5 mg in the morning and 5 mg in the evening as opposed to a combined dose of 10mg  - Recheck BP 1 month

## 2021-03-10 NOTE — Assessment & Plan Note (Signed)
Flu shot administered today. 

## 2021-03-10 NOTE — Progress Notes (Signed)
   CC: blood pressure check  HPI:  [name] is a 55 y.o. female with HTN, HLD, and bilateral hearing loss who presents to the [facility] for a blood pressure check. Please see problem-based list for further details, assessments, and plans.   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  HENT:  Positive for hearing loss. Negative for sore throat.   Eyes:  Negative for blurred vision and double vision.  Respiratory:  Negative for cough and shortness of breath.   Cardiovascular:  Negative for chest pain and palpitations.  Gastrointestinal:  Negative for diarrhea, nausea and vomiting.  Genitourinary: Negative.   Musculoskeletal: Negative.   Neurological:  Negative for dizziness, loss of consciousness and headaches.    Physical Exam:  Vitals:   03/10/21 1358  BP: (!) 143/91  Pulse: 77  Temp: 98.1 F (36.7 C)  TempSrc: Oral  SpO2: 99%  Weight:  189 lb 6.4 oz (85.9 kg)   General: No acute distress. Head: Normocephalic. Atraumatic. CV: RRR. No murmurs, rubs, or gallops. No LE edema Pulmonary: Lungs CTAB. Normal effort. No wheezing or rales. Abdominal: Soft, nontender, nondistended. Normal bowel sounds. Extremities: Palpable radial and DP pulses. Normal ROM. Skin: Warm and dry. No obvious rash or lesions. Neuro: A&Ox3. Moves all extremities. Normal sensation. No focal deficit. Psych: Normal mood and affect   Assessment & Plan:   See Encounters Tab for problem based charting.  Patient seen with Dr. Heber Downing

## 2021-03-11 LAB — BMP8+ANION GAP
Anion Gap: 17 mmol/L (ref 10.0–18.0)
BUN/Creatinine Ratio: 20 (ref 9–23)
BUN: 21 mg/dL (ref 6–24)
CO2: 18 mmol/L — ABNORMAL LOW (ref 20–29)
Calcium: 9.5 mg/dL (ref 8.7–10.2)
Chloride: 105 mmol/L (ref 96–106)
Creatinine, Ser: 1.07 mg/dL — ABNORMAL HIGH (ref 0.57–1.00)
Glucose: 76 mg/dL (ref 65–99)
Potassium: 4.5 mmol/L (ref 3.5–5.2)
Sodium: 140 mmol/L (ref 134–144)
eGFR: 62 mL/min/{1.73_m2} (ref >59)

## 2021-03-11 NOTE — Progress Notes (Signed)
Internal Medicine Clinic Attending ° °I saw and evaluated the patient.  I personally confirmed the key portions of the history and exam documented by Dr. Atway and I reviewed pertinent patient test results.  The assessment, diagnosis, and plan were formulated together and I agree with the documentation in the resident’s note.  °

## 2021-03-19 IMAGING — MG DIGITAL SCREENING BILAT W/ CAD
4 series · 4 of 4 positions shown · non-contrast
Comparison: Previous exam(s).

ACR Breast Density Category a: The breast tissue is almost entirely
fatty.

CLINICAL DATA: Screening.

EXAM:
DIGITAL SCREENING BILATERAL MAMMOGRAM WITH CAD

[L CC]
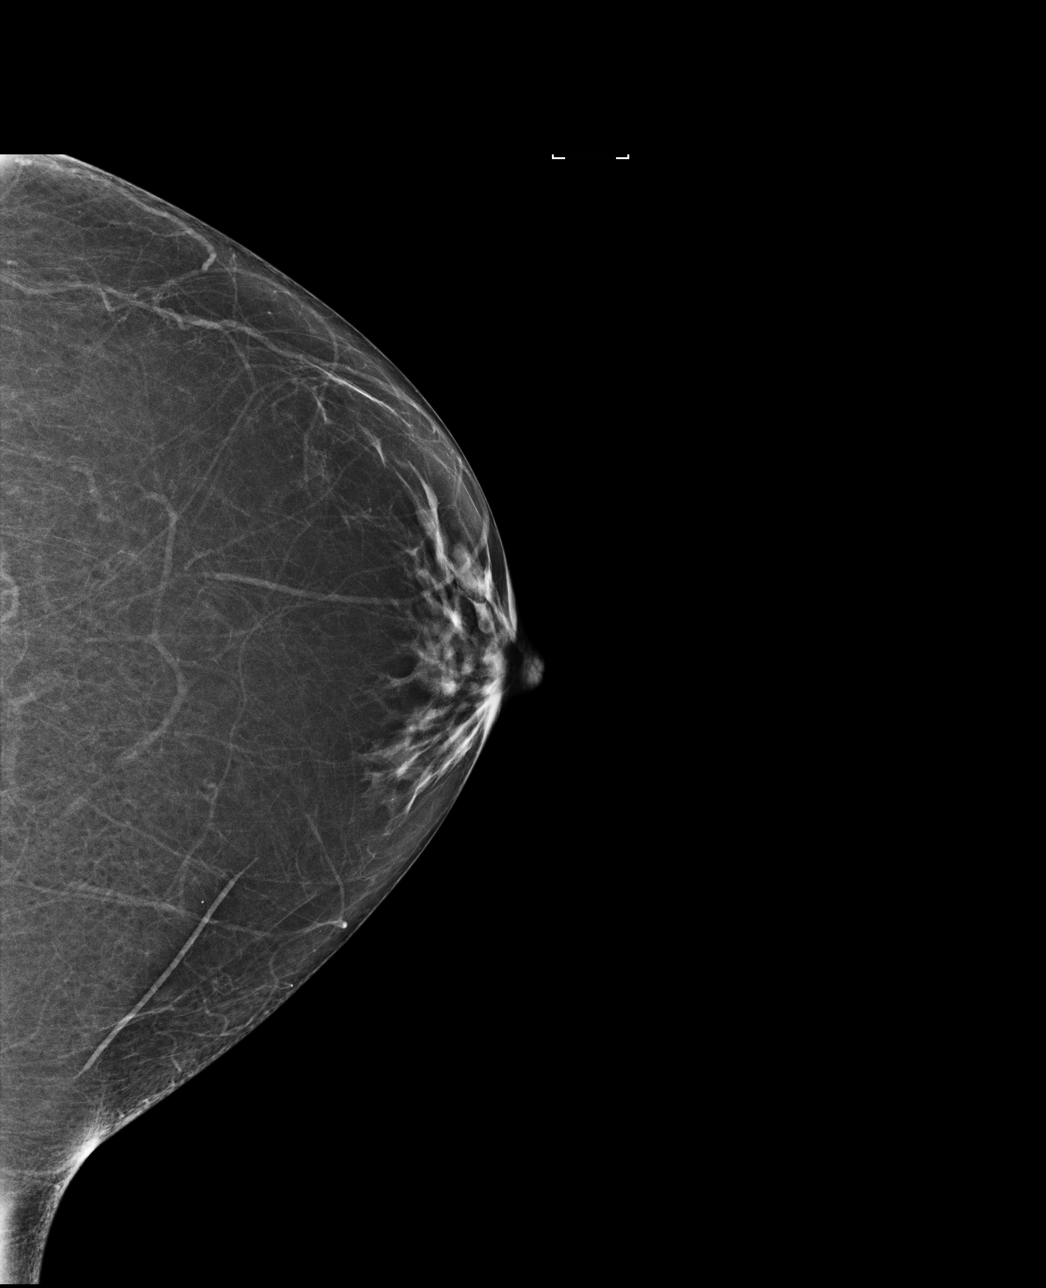

[L MLO]
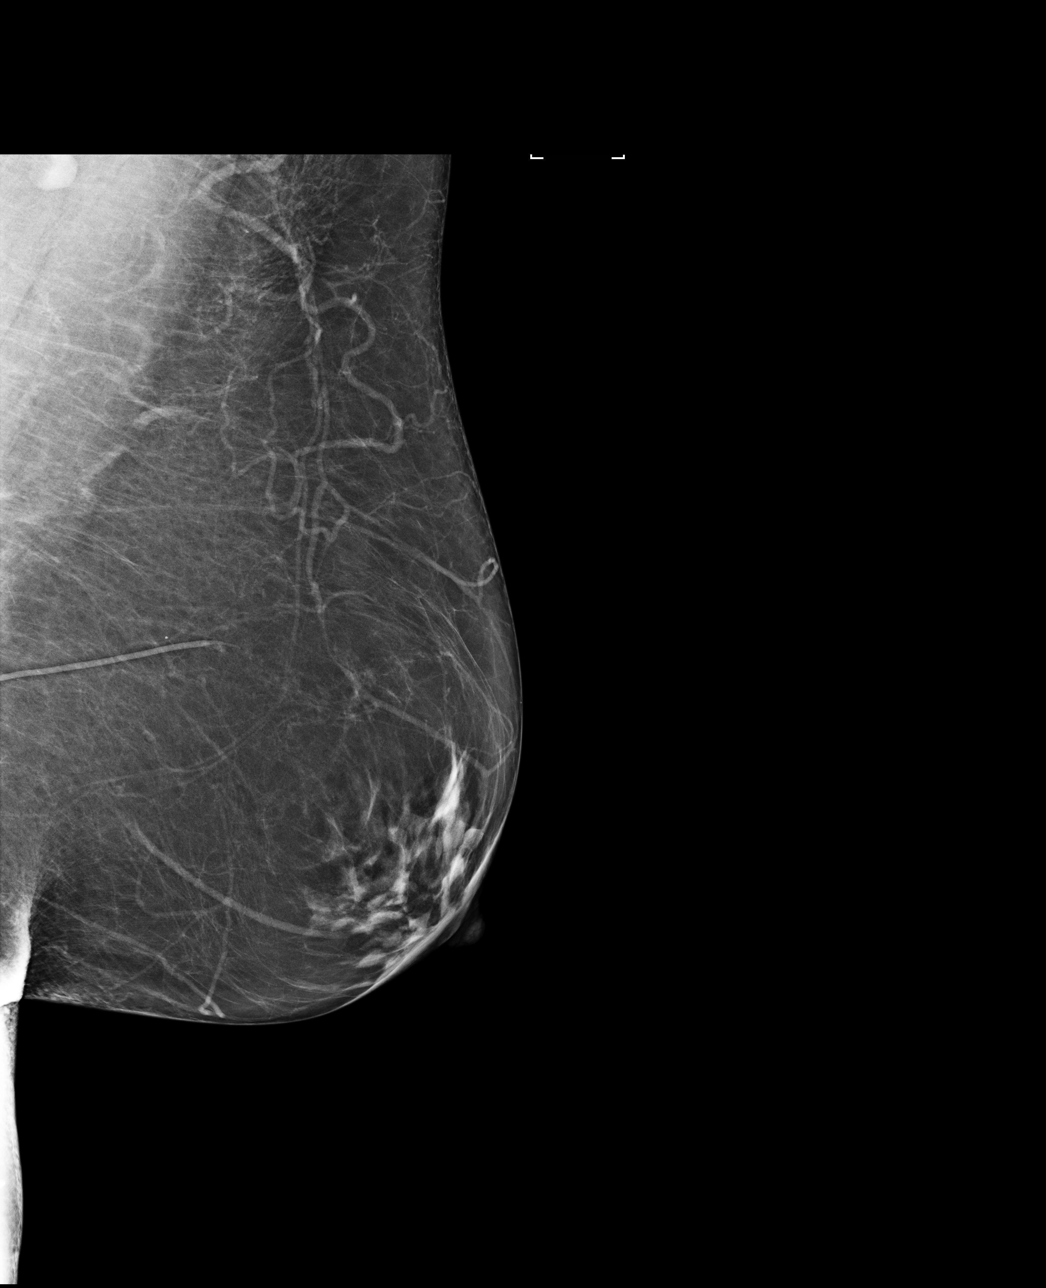

[R CC]
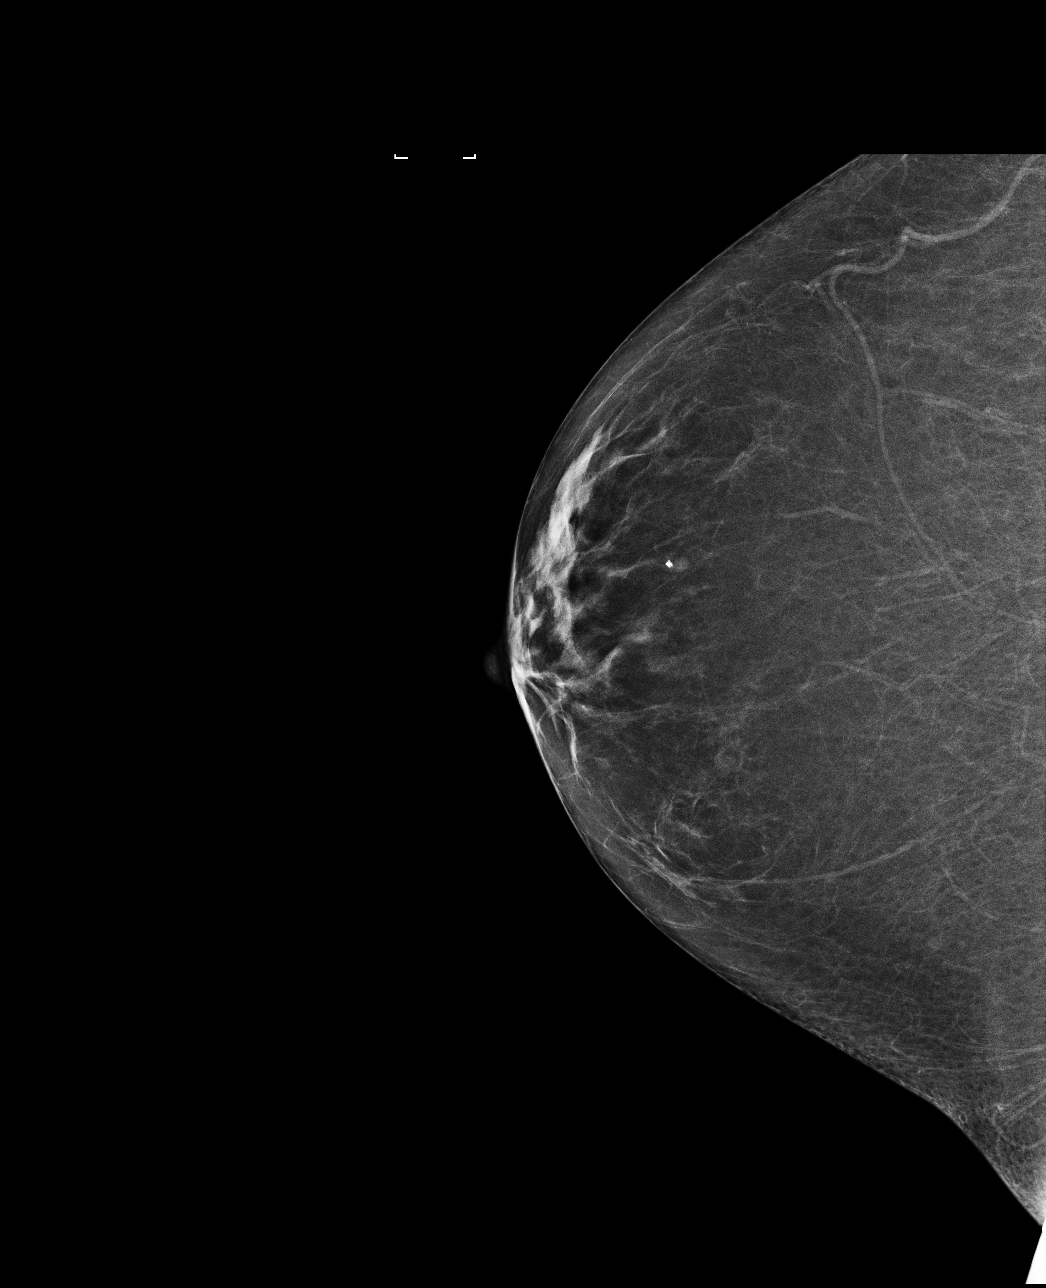

[R MLO]
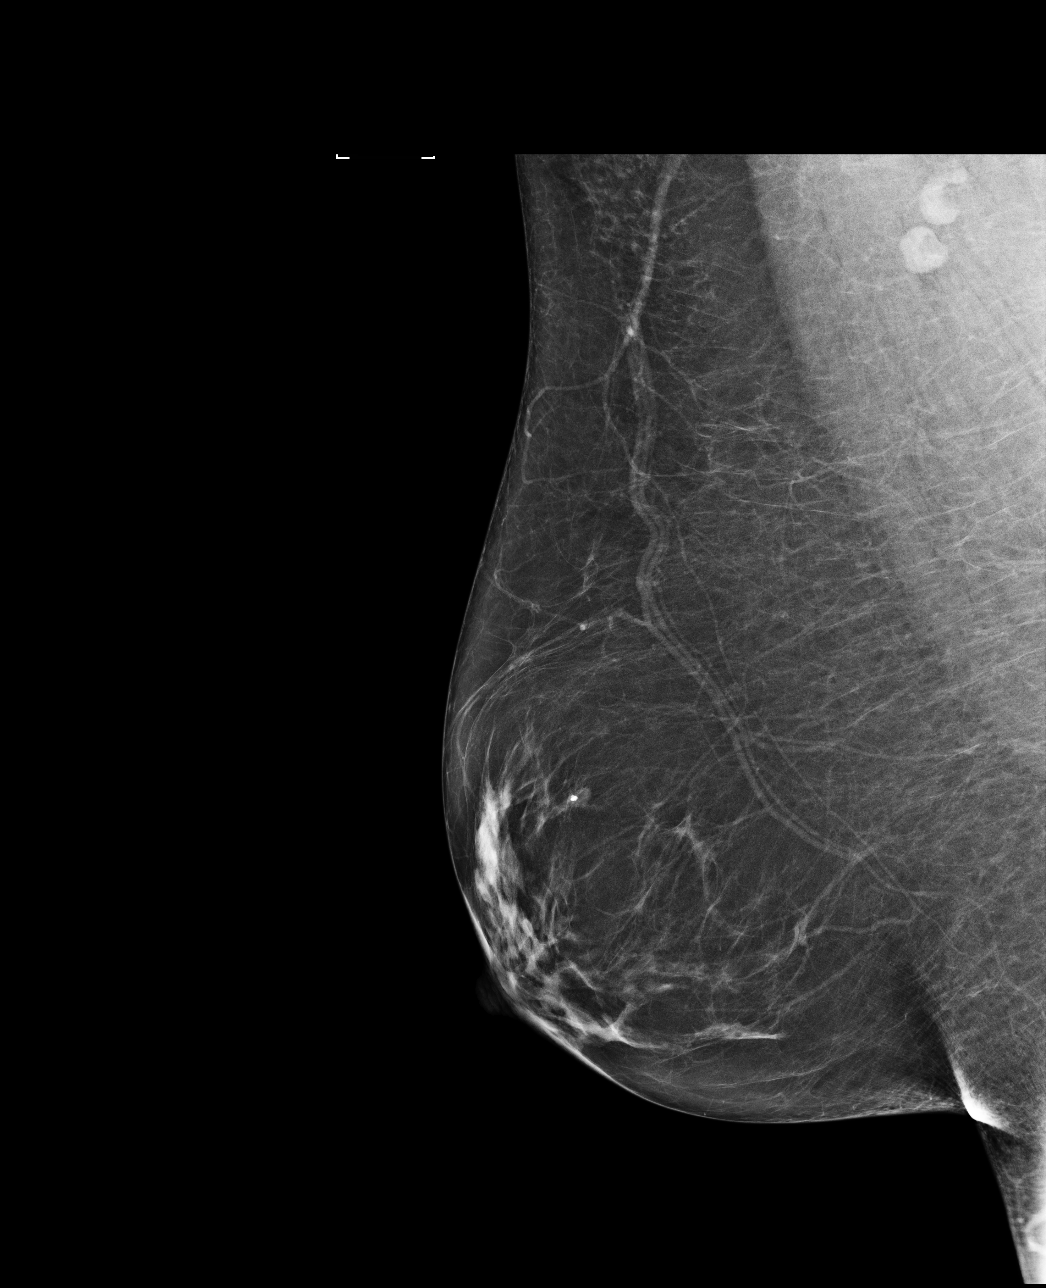

[4 of 4 positions shown; findings below may reference images not displayed]

FINDINGS: There are no findings suspicious for malignancy. Images were
processed with CAD.
IMPRESSION: No mammographic evidence of malignancy. A result letter of this
screening mammogram will be mailed directly to the patient.

RECOMMENDATION:
Screening mammogram in one year. (Code:[id number])

BI-RADS CATEGORY  1: Negative.

## 2021-03-24 ENCOUNTER — Other Ambulatory Visit: Payer: Self-pay | Admitting: Internal Medicine

## 2021-03-24 DIAGNOSIS — Z1231 Encounter for screening mammogram for malignant neoplasm of breast: Secondary | ICD-10-CM

## 2021-03-28 ENCOUNTER — Other Ambulatory Visit (HOSPITAL_COMMUNITY): Payer: Self-pay

## 2021-04-25 ENCOUNTER — Ambulatory Visit
Admission: RE | Admit: 2021-04-25 | Discharge: 2021-04-25 | Disposition: A | Discharge status: Home / Self Care | Payer: Self-pay | Source: Ambulatory Visit | Attending: Nurse Practitioner | Admitting: Nurse Practitioner

## 2021-04-25 ENCOUNTER — Other Ambulatory Visit: Payer: Self-pay

## 2021-04-25 ENCOUNTER — Other Ambulatory Visit (HOSPITAL_COMMUNITY): Payer: Self-pay

## 2021-04-25 DIAGNOSIS — Z1231 Encounter for screening mammogram for malignant neoplasm of breast: Secondary | ICD-10-CM

## 2021-04-26 ENCOUNTER — Other Ambulatory Visit (HOSPITAL_COMMUNITY): Payer: Self-pay

## 2021-04-26 MED ORDER — LISINOPRIL 5 MG PO TABS
10.0000 mg | ORAL_TABLET | Freq: Every day | ORAL | 2 refills | Status: DC
Start: 2021-04-26 — End: 2021-05-26
  Filled 2021-04-26: qty 60, 30d supply, fill #0

## 2021-05-20 ENCOUNTER — Other Ambulatory Visit (HOSPITAL_COMMUNITY): Payer: Self-pay

## 2021-05-26 ENCOUNTER — Other Ambulatory Visit: Payer: Self-pay

## 2021-05-26 ENCOUNTER — Other Ambulatory Visit (HOSPITAL_COMMUNITY): Payer: Self-pay

## 2021-05-26 MED ORDER — LISINOPRIL 5 MG PO TABS
10.0000 mg | ORAL_TABLET | Freq: Every day | ORAL | 2 refills | Status: DC
  Filled 2021-05-26: qty 60, 30d supply, fill #0

## 2021-05-26 NOTE — Telephone Encounter (Signed)
lisinopril (ZESTRIL) 5 MG tablet, REFILL REQUEST @ [facility].

## 2021-05-26 NOTE — Telephone Encounter (Signed)
Next appt scheduled 06/07/21 with Dr Alfonse Spruce.

## 2021-05-27 ENCOUNTER — Other Ambulatory Visit (HOSPITAL_COMMUNITY): Payer: Self-pay

## 2021-06-07 ENCOUNTER — Other Ambulatory Visit (HOSPITAL_COMMUNITY): Payer: Self-pay

## 2021-06-07 ENCOUNTER — Encounter: Payer: Self-pay | Admitting: Student

## 2021-06-07 ENCOUNTER — Ambulatory Visit: Payer: No Typology Code available for payment source | Admitting: Student

## 2021-06-07 ENCOUNTER — Other Ambulatory Visit: Payer: Self-pay

## 2021-06-07 VITALS — BP 112/79 | HR 72 | Temp 97.8°F | Ht 67.0 in | Wt 190.7 lb

## 2021-06-07 DIAGNOSIS — I10 Essential (primary) hypertension: Secondary | ICD-10-CM | POA: Diagnosis not present

## 2021-06-07 DIAGNOSIS — C01 Malignant neoplasm of base of tongue: Secondary | ICD-10-CM

## 2021-06-07 DIAGNOSIS — Z124 Encounter for screening for malignant neoplasm of cervix: Secondary | ICD-10-CM

## 2021-06-07 DIAGNOSIS — I998 Other disorder of circulatory system: Secondary | ICD-10-CM | POA: Diagnosis not present

## 2021-06-07 DIAGNOSIS — Z1211 Encounter for screening for malignant neoplasm of colon: Secondary | ICD-10-CM

## 2021-06-07 DIAGNOSIS — Z0000 Encounter for general adult medical examination without abnormal findings: Secondary | ICD-10-CM

## 2021-06-07 MED ORDER — LISINOPRIL 10 MG PO TABS
10.0000 mg | ORAL_TABLET | Freq: Every day | ORAL | 11 refills | Status: DC
  Filled 2021-06-07: qty 90, 90d supply, fill #0
  Filled 2021-09-01: qty 90, 90d supply, fill #1

## 2021-06-07 MED ORDER — AMLODIPINE BESYLATE 5 MG PO TABS
5.0000 mg | ORAL_TABLET | Freq: Every day | ORAL | 11 refills | Status: DC
  Filled 2021-06-07: qty 90, 90d supply, fill #0
  Filled 2021-09-01: qty 90, 90d supply, fill #1
  Filled 2021-11-28: qty 90, 90d supply, fill #2
  Filled 2022-02-22: qty 90, 90d supply, fill #3

## 2021-06-07 NOTE — Assessment & Plan Note (Addendum)
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  No prior imaging was done.  A vascular surgery referral was made last September but they were not able to contact patient.  -Placed a new order for vascular surgeon referral.  We confirmed that her active phone number is in the chart -Continue Crestor 10 mg.  Last LDL improved 170 to 83

## 2021-06-07 NOTE — Assessment & Plan Note (Addendum)
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  She never received radiation or chemotherapy.  She last saw her ENT in September 2021.  She is clinically cured per ENT.  Patient has difficult speaking but denies a dyne aphasia.  -Follow up with ENT in February

## 2021-06-07 NOTE — Assessment & Plan Note (Signed)
-   Order fit test for 2022

## 2021-06-07 NOTE — Progress Notes (Signed)
Internal Medicine Clinic Attending  Case discussed with Dr. Nguyen  At the time of the visit.  We reviewed the resident's history and exam and pertinent patient test results.  I agree with the assessment, diagnosis, and plan of care documented in the resident's note. 

## 2021-06-07 NOTE — Progress Notes (Signed)
° °  CC: Follow-up on blood pressure  HPI:  [name] is a 55 y.o. with past medical history of hypertension, head and neck cancer, who presented to the clinic today for blood pressure recheck.  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/2010   Annotation: dx at age 68 Qualifier: Diagnosis of  By: Hassell Done FNP, Nykedtra     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5 kg)  Height: 5\' 7"  (1.702 m)   Physical Exam Constitutional:      General: She is not in acute distress.    Appearance: She is not ill-appearing.  HENT:     Head: Normocephalic.     Mouth/Throat:     Comments: Mild dysarthria Eyes:     General:        Right eye: No discharge.        Left eye: No discharge.  Neck:     Comments: No mass palpated.  A well-healed surgical scar along the mandibular line on the right. Cardiovascular:     Rate and Rhythm: Normal  rate and regular rhythm.     Pulses: Normal pulses.  Pulmonary:     Effort: Pulmonary effort is normal. No respiratory distress.     Breath sounds: Normal breath sounds. No wheezing.  Musculoskeletal:     Cervical back: Normal range of motion.  Skin:    General: Skin is warm.  Neurological:     General: No focal deficit present.     Mental Status: She is alert and oriented to person, place, and time.  Psychiatric:        Mood and Affect: Mood normal.        Behavior: Behavior normal.     Assessment & Plan:   See Encounters Tab for problem based charting.  Patient discussed with Dr. Heber Bethany

## 2021-06-07 NOTE — Assessment & Plan Note (Signed)
-  Order fit test for 2022 -She would like to postpone her Pap smear to next visit -Declined Tdap -Has received 4 shot of COVID-vaccine total

## 2021-06-07 NOTE — Assessment & Plan Note (Signed)
Patient is here for blood pressure recheck.  BP on the right side initially was 160/102, repeat 148/109. She reports adherence to lisinopril 10 mg daily.  She is however taking 2 tablets of lisinopril 5 mg. Reports eating home-cooked food, only added a little bit of salt.  -Continue lisinopril 10 mg daily.  New 10 mg tablet refill sent -Add amlodipine 5 mg -Follow-up in 4 weeks for blood pressure recheck

## 2021-06-07 NOTE — Patient Instructions (Addendum)
Ms. Sirek,  It was a pleasure seeing you in the clinic today.  Here is a summary what we talked about:  1.  High blood pressure: Your blood pressure is elevated today at 160/102.  Please continue lisinopril 10 mg daily.  I will add amlodipine 5 mg into your regimen.  Please return in 4 weeks for blood pressure recheck.  2.  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  I placed a new referral to vascular surgeon.  They will call you for an appointment.  Continue taking your Crestor.  3.  Please follow-up with your ENT doctor for your cancer.  Return in 4 weeks  Take care,  Dr. Alfonse Spruce

## 2021-06-08 ENCOUNTER — Other Ambulatory Visit (HOSPITAL_COMMUNITY): Payer: Self-pay

## 2021-06-09 ENCOUNTER — Encounter: Payer: Self-pay | Admitting: Internal Medicine

## 2021-06-22 ENCOUNTER — Other Ambulatory Visit: Payer: No Typology Code available for payment source

## 2021-06-22 DIAGNOSIS — Z1211 Encounter for screening for malignant neoplasm of colon: Secondary | ICD-10-CM

## 2021-06-23 LAB — FECAL OCCULT BLOOD, IMMUNOCHEMICAL: Fecal Occult Bld: NEGATIVE

## 2021-06-29 ENCOUNTER — Telehealth: Payer: Self-pay | Admitting: Internal Medicine

## 2021-06-29 NOTE — Telephone Encounter (Signed)
Pt requesting a call back about her test results.

## 2021-06-30 ENCOUNTER — Telehealth: Payer: Self-pay | Admitting: Student

## 2021-06-30 ENCOUNTER — Telehealth: Payer: Self-pay | Admitting: Internal Medicine

## 2021-06-30 NOTE — Telephone Encounter (Signed)
Called patient back with results from FIT test. Let her know this was negative.

## 2021-06-30 NOTE — Telephone Encounter (Signed)
Patient rtn call again about her test results.

## 2021-08-18 ENCOUNTER — Other Ambulatory Visit (HOSPITAL_COMMUNITY): Payer: Self-pay

## 2021-09-01 ENCOUNTER — Other Ambulatory Visit (HOSPITAL_COMMUNITY): Payer: Self-pay

## 2021-11-16 ENCOUNTER — Other Ambulatory Visit: Payer: Self-pay

## 2021-11-17 ENCOUNTER — Other Ambulatory Visit: Payer: Self-pay | Admitting: *Deleted

## 2021-11-17 ENCOUNTER — Telehealth: Payer: Self-pay

## 2021-11-17 NOTE — Telephone Encounter (Signed)
acetaminophen (TYLENOL)  [place], [place] - [address] [facility]  [address] [place] [address]  Phone:  [phone]  Fax:  [phone]

## 2021-11-18 ENCOUNTER — Other Ambulatory Visit (HOSPITAL_COMMUNITY): Payer: Self-pay

## 2021-11-18 ENCOUNTER — Telehealth: Payer: Self-pay | Admitting: Internal Medicine

## 2021-11-18 MED ORDER — ACETAMINOPHEN 500 MG PO TABS
1000.0000 mg | ORAL_TABLET | Freq: Two times a day (BID) | ORAL | 0 refills | Status: AC
  Filled 2021-11-18: qty 30, 8d supply, fill #0

## 2021-11-18 NOTE — Telephone Encounter (Signed)
MED REFILL REQUEST  acetaminophen (TYLENOL) 500 MG tablet  [facility] Phone:  [phone]  Fax:  [phone]     Patient states medicine has not been called into the pharmacy.

## 2021-11-21 ENCOUNTER — Other Ambulatory Visit: Payer: Self-pay

## 2021-11-21 ENCOUNTER — Other Ambulatory Visit (HOSPITAL_COMMUNITY): Payer: Self-pay

## 2021-11-21 DIAGNOSIS — I10 Essential (primary) hypertension: Secondary | ICD-10-CM

## 2021-11-21 MED ORDER — LISINOPRIL 10 MG PO TABS
10.0000 mg | ORAL_TABLET | Freq: Every day | ORAL | 11 refills | Status: DC
  Filled 2021-11-21 – 2021-11-28 (×2): qty 30, 30d supply, fill #0
  Filled 2021-11-29: qty 30, 30d supply, fill #1
  Filled 2022-01-02: qty 30, 30d supply, fill #2
  Filled 2022-02-01: qty 30, 30d supply, fill #3
  Filled 2022-02-22: qty 30, 30d supply, fill #4

## 2021-11-21 NOTE — Telephone Encounter (Signed)
Tylenol rx was sent to [facility] on 6/2. Called pt - no answer; left message on self-identified vm to call the pharmacy.

## 2021-11-22 ENCOUNTER — Other Ambulatory Visit (HOSPITAL_COMMUNITY): Payer: Self-pay

## 2021-11-22 ENCOUNTER — Other Ambulatory Visit: Payer: Self-pay

## 2021-11-23 ENCOUNTER — Other Ambulatory Visit (HOSPITAL_COMMUNITY): Payer: Self-pay

## 2021-11-29 ENCOUNTER — Other Ambulatory Visit (HOSPITAL_COMMUNITY): Payer: Self-pay

## 2021-11-29 MED ORDER — ROSUVASTATIN CALCIUM 10 MG PO TABS
10.0000 mg | ORAL_TABLET | Freq: Every day | ORAL | 3 refills | Status: DC
  Filled 2021-11-29: qty 90, 90d supply, fill #0
  Filled 2022-02-22: qty 90, 90d supply, fill #1
  Filled 2022-05-24: qty 90, 90d supply, fill #2
  Filled 2022-08-23: qty 90, 90d supply, fill #3

## 2022-01-02 ENCOUNTER — Other Ambulatory Visit (HOSPITAL_COMMUNITY): Payer: Self-pay

## 2022-02-01 ENCOUNTER — Other Ambulatory Visit (HOSPITAL_COMMUNITY): Payer: Self-pay

## 2022-02-23 ENCOUNTER — Other Ambulatory Visit (HOSPITAL_COMMUNITY): Payer: Self-pay

## 2022-02-23 ENCOUNTER — Telehealth: Payer: Self-pay | Admitting: *Deleted

## 2022-02-23 DIAGNOSIS — I10 Essential (primary) hypertension: Secondary | ICD-10-CM

## 2022-02-23 MED ORDER — AMLODIPINE BESYLATE 5 MG PO TABS
5.0000 mg | ORAL_TABLET | Freq: Every day | ORAL | 3 refills | Status: DC
  Filled 2022-02-23: qty 90, 90d supply, fill #0
  Filled 2022-06-01: qty 90, 90d supply, fill #1
  Filled 2022-08-23: qty 90, 90d supply, fill #2
  Filled 2022-11-27: qty 90, 90d supply, fill #3

## 2022-02-23 MED ORDER — LISINOPRIL 10 MG PO TABS
10.0000 mg | ORAL_TABLET | Freq: Every day | ORAL | 3 refills | Status: DC
  Filled 2022-02-23 – 2022-02-27 (×2): qty 90, 90d supply, fill #0
  Filled 2022-06-01: qty 90, 90d supply, fill #1
  Filled 2022-08-23: qty 90, 90d supply, fill #2
  Filled 2022-11-27: qty 90, 90d supply, fill #3

## 2022-02-23 NOTE — Telephone Encounter (Signed)
Message from Pharmacy-patient now has insurance got her Norvasc 5 mg  which was written only for 30 tablets.  Patient paid out of pocket for 90 tablets.  Has Insurance now that will dispense as written new prescription needs to be written with quantity of 90.

## 2022-02-27 ENCOUNTER — Other Ambulatory Visit (HOSPITAL_COMMUNITY): Payer: Self-pay

## 2022-04-07 ENCOUNTER — Other Ambulatory Visit: Payer: Self-pay | Admitting: Nurse Practitioner

## 2022-04-07 DIAGNOSIS — Z1231 Encounter for screening mammogram for malignant neoplasm of breast: Secondary | ICD-10-CM

## 2022-05-25 ENCOUNTER — Other Ambulatory Visit: Payer: Self-pay

## 2022-05-30 ENCOUNTER — Ambulatory Visit
Admission: RE | Admit: 2022-05-30 | Discharge: 2022-05-30 | Disposition: A | Discharge status: Home / Self Care | Payer: No Typology Code available for payment source | Source: Ambulatory Visit | Attending: Nurse Practitioner | Admitting: Nurse Practitioner

## 2022-05-30 DIAGNOSIS — Z1231 Encounter for screening mammogram for malignant neoplasm of breast: Secondary | ICD-10-CM

## 2022-06-30 ENCOUNTER — Encounter: Payer: Self-pay | Admitting: Internal Medicine

## 2022-07-06 NOTE — Progress Notes (Signed)
CC: Routine Follow-up  HPI:   [name] is a 57 y.o. female with a past medical history of hypertension, head-neck carcinoma, anemia, and gastroesophageal reflux who presents for overdue follow-up. She was last seen at [facility] in 05-2021.    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   treated with chemotherapy and radiation   Sensorineural hearing loss of both ears    likely secondary to the radiation therapy, chemotherapy and the vascular flow issues   Unspecified hearing loss 05/05/2010   Annotation: bil hearing aids since age 57 - nasal cancer Qualifier: Diagnosis of  By: Hassell Done FNP, Nykedtra     UNSPECIFIED SLEEP DISTURBANCE 05/05/2010   Qualifier: Diagnosis of  By: Hassell Done FNP, Tori Milks       Review of Systems:    Reports feeling good overall Denies fever, recent illnesses, pain, bowel or bladder changes   Physical Exam:  Vitals:   07/10/22 1533 07/10/22 1620  BP: (!) 159/98 (!) 157/105  Pulse: 73 73  Temp: 98.1 F (36.7 C)   TempSrc: Oral   SpO2: 99%   Weight: 195 lb 11.2 oz (88.8 kg)   Height: '5\' 7"'$  (1.702 m)     General:   awake and alert, sitting comfortably in chair, cooperative, not in acute distress Skin:   warm and dry, intact without any obvious lesions or scars, no rashes or lesions  Lungs:   normal respiratory effort, breathing unlabored, symmetrical chest rise Cardiac:   regular rate and rhythm, normal S1 and  S2 Neurologic:   oriented to person-place-time, mild dysarthria, moving all extremities, no gross focal deficits Psychiatric:   euthymic mood with congruent affect, intelligible speech    Assessment & Plan:   Essential hypertension Patient has history of hypertension managed at home with lisinopril and amlodipine. She checks her blood pressure every night with values consistently in the 120s/80s. Today in clinic, her BP was 159/98 and repeat 157/105. Given that her self-checks at home are consistently in a desirable range, changes to her medications are unnecessary at this time. Most recent BMP in 02-2021 was unremarkable.  - Continue lisinopril '10mg'$  q24 and amlodipine '5mg'$  q24 - Recheck BMP to monitor Na, K, and Cr    Healthcare maintenance Patient has already received influenza and five total COVID vaccinations. Most recent mammogram in 05-2022 negative for malignancy. Last fecal immunochemical test performed 06-2021 was negative. Due for Pap smear, which she deferred until her next visit.  - Fecal immunochemical test for 2024 - Perform Pap smear at next visit, female provider requested    Mixed hyperlipidemia Patient has history of hyperlipidemia managed at home with rosuvastatin. Most recent lipid panel from 08-2020 demonstrated LDL of 83.  - Check lipid panel, consider increasing rosuvastatin if LDL>100    Obesity (BMI 30-39.9) Patient has history of obesity, current BMI is 30.65. She is interested in losing weight  and plans to join the local gym. Discussed dietary changes and effective weight loss strategies including short periods of fasting each day. Today in clinic, her A1C was 5.7.  - Encourage regular exercise and caloric restriction      See Encounters Tab for problem based charting.  Patient discussed with Dr.  Cain Sieve

## 2022-07-10 ENCOUNTER — Encounter: Payer: Self-pay | Admitting: Student

## 2022-07-10 ENCOUNTER — Ambulatory Visit: Payer: No Typology Code available for payment source | Admitting: Student

## 2022-07-10 VITALS — BP 157/105 | HR 73 | Temp 98.1°F | Ht 67.0 in | Wt 195.7 lb

## 2022-07-10 DIAGNOSIS — Z0000 Encounter for general adult medical examination without abnormal findings: Secondary | ICD-10-CM

## 2022-07-10 DIAGNOSIS — Z6830 Body mass index (BMI) 30.0-30.9, adult: Secondary | ICD-10-CM | POA: Diagnosis not present

## 2022-07-10 DIAGNOSIS — I10 Essential (primary) hypertension: Secondary | ICD-10-CM

## 2022-07-10 DIAGNOSIS — Z1211 Encounter for screening for malignant neoplasm of colon: Secondary | ICD-10-CM

## 2022-07-10 DIAGNOSIS — E782 Mixed hyperlipidemia: Secondary | ICD-10-CM | POA: Diagnosis not present

## 2022-07-10 DIAGNOSIS — E669 Obesity, unspecified: Secondary | ICD-10-CM

## 2022-07-10 DIAGNOSIS — Z87891 Personal history of nicotine dependence: Secondary | ICD-10-CM

## 2022-07-10 LAB — POCT GLYCOSYLATED HEMOGLOBIN (HGB A1C): Hemoglobin A1C: 5.7 % — AB (ref 4.0–5.6)

## 2022-07-10 LAB — GLUCOSE, CAPILLARY: Glucose-Capillary: 78 mg/dL (ref 70–99)

## 2022-07-10 NOTE — Assessment & Plan Note (Signed)
Patient has history of hypertension managed at home with lisinopril and amlodipine. She checks her blood pressure every night with values consistently in the 120s/80s. Today in clinic, her BP was 159/98 and repeat 157/105. Given that her self-checks at home are consistently in a desirable range, changes to her medications are unnecessary at this time. Most recent BMP in 02-2021 was unremarkable.  - Continue lisinopril '10mg'$  q24 and amlodipine '5mg'$  q24 - Recheck BMP to monitor Na, K, and Cr

## 2022-07-10 NOTE — Patient Instructions (Signed)
  Thank you, Ms.Christianne Borrow, for allowing [place] to provide your care today. Today we discussed . . .  > Hypertension       - continue to take your lisinopril plus amlodipine and measure your blood pressure daily, we recommend bringing a log to your next visit with [place] > Hyperlipidemia       - continue to take rosuvastatin daily, we are checking your cholesterol today and will call you with the results > Obesity       - your BMI of 30 technically qualifies you for obesity, joining the gym and exercising regularly is a great idea      - the key to weight loss is allowing your body to feel hungry for a few hours each day   I have ordered the following labs for you:  Lab Orders         Fecal occult blood, imunochemical         BMP8+Anion Gap         Lipid Profile         POC Hbg A1C       Tests ordered today:  none   Referrals ordered today:   Referral Orders  No referral(s) requested today      I have ordered the following medication/changed the following medications:   Stop the following medications: There are no discontinued medications.   Start the following medications: No orders of the defined types were placed in this encounter.     Follow up: 3 months    Remember:  Please continue to take your medications and work toward losing some weight with exercise plus dietary changes. We will call you with the results of your laboratory testing and see you again in about three months!   Should you have any questions or concerns please call the internal medicine clinic at [phone].     Roswell Nickel, MD [place]

## 2022-07-10 NOTE — Assessment & Plan Note (Signed)
Patient has history of hyperlipidemia managed at home with rosuvastatin. Most recent lipid panel from 08-2020 demonstrated LDL of 83.  - Check lipid panel, consider increasing rosuvastatin if LDL>100

## 2022-07-10 NOTE — Assessment & Plan Note (Signed)
Patient has history of obesity, current BMI is 30.65. She is interested in losing weight and plans to join the local gym. Discussed dietary changes and effective weight loss strategies including short periods of fasting each day. Today in clinic, her A1C was 5.7.  - Encourage regular exercise and caloric restriction

## 2022-07-10 NOTE — Assessment & Plan Note (Signed)
Patient has already received influenza and five total COVID vaccinations. Most recent mammogram in 05-2022 negative for malignancy. Last fecal immunochemical test performed 06-2021 was negative. Due for Pap smear, which she deferred until her next visit.  - Fecal immunochemical test for 2024 - Perform Pap smear at next visit, female provider requested

## 2022-07-11 LAB — BMP8+ANION GAP
Anion Gap: 17 mmol/L (ref 10.0–18.0)
BUN/Creatinine Ratio: 25 — ABNORMAL HIGH (ref 9–23)
BUN: 23 mg/dL (ref 6–24)
CO2: 19 mmol/L — ABNORMAL LOW (ref 20–29)
Calcium: 9.4 mg/dL (ref 8.7–10.2)
Chloride: 104 mmol/L (ref 96–106)
Creatinine, Ser: 0.92 mg/dL (ref 0.57–1.00)
Glucose: 72 mg/dL (ref 70–99)
Potassium: 4.1 mmol/L (ref 3.5–5.2)
Sodium: 140 mmol/L (ref 134–144)
eGFR: 73 mL/min/{1.73_m2} (ref >59)

## 2022-07-11 LAB — LIPID PANEL
Chol/HDL Ratio: 3.2 ratio (ref 0.0–4.4)
Cholesterol, Total: 151 mg/dL (ref 100–199)
HDL: 47 mg/dL (ref >39)
LDL Chol Calc (NIH): 84 mg/dL (ref 0–99)
Triglycerides: 109 mg/dL (ref 0–149)
VLDL Cholesterol Cal: 20 mg/dL (ref 5–40)

## 2022-07-11 NOTE — Progress Notes (Signed)
Internal Medicine Clinic Attending  Case discussed with Dr.  Jodi Mourning   At the time of the visit.  We reviewed the resident's history and exam and pertinent patient test results.  I agree with the assessment, diagnosis, and plan of care documented in the resident's note.    Patient is due for her pap. She would prefer to have this done with a female physician, so we have requested that for her next visit. Please address pap at next visit.

## 2022-08-25 ENCOUNTER — Other Ambulatory Visit (HOSPITAL_COMMUNITY): Payer: Self-pay

## 2022-10-23 ENCOUNTER — Encounter: Payer: No Typology Code available for payment source | Admitting: Student

## 2022-11-22 ENCOUNTER — Other Ambulatory Visit: Payer: Self-pay

## 2022-11-28 ENCOUNTER — Other Ambulatory Visit (HOSPITAL_COMMUNITY): Payer: Self-pay

## 2023-01-15 ENCOUNTER — Ambulatory Visit: Payer: No Typology Code available for payment source | Admitting: Internal Medicine

## 2023-01-15 ENCOUNTER — Other Ambulatory Visit (HOSPITAL_COMMUNITY): Payer: Self-pay

## 2023-01-15 ENCOUNTER — Encounter: Payer: Self-pay | Admitting: Internal Medicine

## 2023-01-15 VITALS — BP 186/102 | HR 79 | Temp 98.0°F | Wt 194.9 lb

## 2023-01-15 DIAGNOSIS — Z1231 Encounter for screening mammogram for malignant neoplasm of breast: Secondary | ICD-10-CM

## 2023-01-15 DIAGNOSIS — I998 Other disorder of circulatory system: Secondary | ICD-10-CM

## 2023-01-15 DIAGNOSIS — Z1211 Encounter for screening for malignant neoplasm of colon: Secondary | ICD-10-CM

## 2023-01-15 DIAGNOSIS — I10 Essential (primary) hypertension: Secondary | ICD-10-CM

## 2023-01-15 DIAGNOSIS — M25512 Pain in left shoulder: Secondary | ICD-10-CM

## 2023-01-15 DIAGNOSIS — Z124 Encounter for screening for malignant neoplasm of cervix: Secondary | ICD-10-CM

## 2023-01-15 NOTE — Patient Instructions (Signed)
Thank you, Ms.[name] for allowing [place] to provide your care today. Today we discussed the following.  Blood Pressure : You have large differences in BP in your arm. We want to do a CT to find the cause. We will follow up once the results come.  Continue your blood pressure medications.  Pap Smear : You are due for a pap smear. Please make sure to get this done on your next visit.   Colon Cancer Screening : You will do a FIT test where they will collect your stool. You will need to mail your sample.  Shoulder pain:  Notify us if tylenol does not help the pain, if it gets worse or you have other symptoms.    Referrals ordered today:   Referral Orders  No referral(s) requested today     I have ordered the following medication/changed the following medications:   Stop the following medications: There are no discontinued medications.   Start the following medications: No orders of the defined types were placed in this encounter.    Follow up: Someone will call for a follow up appointment.   Remember:  We look forward to seeing you next time. Please call our clinic at [phone] if you have any questions or concerns. The best time to call is Monday-Friday from 9am-4pm, but there is someone available 24/7. If after hours or the weekend, call the main hospital number and ask for the Internal Medicine Resident On-Call. If you need medication refills, please notify your pharmacy one week in advance and they will send [place] a request.   Thank you for trusting me with your care. Wishing you the best!   Rudene Christians, DO [facility]

## 2023-01-15 NOTE — Assessment & Plan Note (Signed)
Stacy Anderson has previously denied pap smear and has denied it today. She says she is not scared or nervous but she just does not want them today. She agreed she will definitely do it next time.   Plan  - Pap smear with co-testing in next visit.

## 2023-01-15 NOTE — Assessment & Plan Note (Signed)
Ms. Weiland endorses left shoulder pain which started a month ago. It has not bothered her as much but she notices them when she is gardening or sometimes when she sleeps on her left side at night. She denies numbness or tingling in the arm, weakness, swelling or any neck pain. She also denied any trauma to the shoulders. Tylenol has helped relieve the sx and occasionally takes them, but she thinks it is mostly occurring due to position or activity. She reports her pain as being maybe a 1, and reports if it was really bothering her she would make a separate appointment. On exam, rotating her arm elicited slight pain.  Assessment and Plan - Ddx for her pain includes rotator cuff tendinitis vs shoulder bursitis which is less likely since there was no swelling, other less likely causes could be thoracic outlet syndrome or subclavian artery compression. Given only shoulder pain and no other associating sx, we do not think she needs additional testing at this time.   Plan: closely monitor sx and takes tylenol as needed

## 2023-01-15 NOTE — Assessment & Plan Note (Signed)
Stacy Anderson is due for her colonoscopy. She had a negative FIT test on 02/25/2020. She did not want to get a colonoscopy and was okay with getting a FIT test instead. She was educated by the lab technicians on how to properly collect and bring the samples back to clinic.   Plan  - Get FIT testing

## 2023-01-15 NOTE — Progress Notes (Unsigned)
The care of the patient was discussed with Dr. Champ Mungo and the assessment and plan was formulated with their assistance.  Please see their note for official documentation of the patient encounter.   Subjective:   Patient ID: [facility] female   DOB: [birth year] 57 y.o.   MRN: [id number]  HPI: [name] is a 57 y.o. 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 She lives alone by herself. Her husband passed away 2 years ago and she has no kids. Her works keeps her active and she likes to garden. She feels she is perfectly fine and requested for her medication refills. Please see problem based assessment and plan for more details.    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   treated with chemotherapy and radiation   Sensorineural hearing loss of both ears    likely secondary to the radiation therapy, chemotherapy and the vascular flow issues   Unspecified hearing loss 05/05/2010   Annotation: bil hearing aids since age 16 - nasal cancer Qualifier: Diagnosis of  By: Daphine Deutscher FNP, Nykedtra     UNSPECIFIED SLEEP DISTURBANCE 05/05/2010   Qualifier: Diagnosis of  By: Daphine Deutscher FNP, Zena Amos     Current Outpatient Medications  Medication Sig Dispense Refill   acetaminophen (TYLENOL) 500 MG tablet Take 2 tablets (1,000  mg total) by mouth 2 (two) times daily. 30 tablet 0   amLODipine (NORVASC) 5 MG tablet Take 1 tablet (5 mg total) by mouth daily. 90 tablet 3   lisinopril (ZESTRIL) 10 MG tablet Take 1 tablet (10 mg total) by mouth daily. 90 tablet 3   omeprazole (PRILOSEC) 20 MG capsule Take 20 mg by mouth daily.     rosuvastatin (CRESTOR) 10 MG tablet Take 1 tablet (10 mg total) by mouth daily. 90 tablet 3   No current facility-administered medications for this visit.   Family History  Problem Relation Age of Onset   Hypertension Mother    Heart disease Mother    Diabetes Father    Diabetes Sister    Cancer Brother 40       Brain cancer    Social History   Socioeconomic History   Marital status: Single    Spouse name: Not on file   Number of children: 0   Years of education: 12th grade   Highest education level: Not on file  Occupational History   Occupation: Cook    Employer: [employer]  Tobacco Use   Smoking status: Former    Current packs/day: 0.00    Average packs/day: 0.3 packs/day for 20.0 years (5.0 ttl pk-yrs)    Types: Cigarettes    Start date: 09/07/1992    Quit date: 09/07/2012    Years since quitting: 10.3   Smokeless tobacco:  Never  Substance and Sexual Activity   Alcohol use: No   Drug use: No   Sexual activity: Not on file  Other Topics Concern   Not on file  Social History Narrative   Lives in [place], alone   Social Determinants of Health   Financial Resource Strain: Not on file  Food Insecurity: Not on file  Transportation Needs: Not on file  Physical Activity: Not on file  Stress: Not on file  Social Connections: Not on file   Review of Systems: ROS negative except for what is noted on the assessment and plan.  Objective:  Physical Exam: Vitals:   01/15/23 1438 01/15/23 1517 01/15/23 1519  BP: (!) 158/102 (!) 123/97 (!) 186/102  Pulse: 84 75 79  Temp: 98 F (36.7 C)    TempSrc: Oral    SpO2: 100%    Weight: 194 lb 14.4 oz (88.4 kg)       General appearance: well- appearing Eyes tracking appropriately Lungs: CTAB, no crackles, no wheeze, with normal respiratory effort and no intercostal retractions CV: RRR, S1, S2, no MRGs  Extremities: No peripheral edema, radial and DP pulses present bilaterally  Psych: Appropriate affect Neuro: Alert and oriented to person and place, no focal deficit   Assessment & Plan:  Asymmetric blood pressures Ms. Stacy [facility] has a hx of asymmetric blood pressure. She denies any SOB, dizziness, chest palpitations, blurry vision, n/v, or jaw claudications. She does endorse left shoulder pain which has not bothered her as much. She is adherent to her bp medications including amlodipine and lisinopril and takes them every morning. She reports she checks her bp every morning at home and they usually read around 130/90s. She did not have her monitor with her today. In clinic, her first bp was 158/102 on her right arm. When it was rechecked it was 186/102 on right arm and 123/97 on left. She was previously sent to vascular in 2022 and had a arterial doppler done which did not show any stenosis. It was suspected the stenosis could be proximal to what was done. She lost follow up with vascular.  A/P  She could be having arterial stenosis vs PAD vs subclavian artery compression. CT may be appropriate to figure out the discrepancies between her bp. Since she reports her home bp has been okay we want to wait until she gets her CT scan to make changes in her bp medications. Advised her to bring her monitoring device for her follow up appointment.  Plan  - Order CT  - continue current dose of 5 mg amlodipine and 10 mg lisinopril   Left shoulder pain Ms. Stacy Anderson endorses left shoulder pain which started a month ago. It has not bothered her as much but she notices them when she is gardening or sometimes when she sleeps on her left side at night. She denies numbness or tingling in the arm, weakness, swelling or any neck  pain. She also denied any trauma to the shoulders. Tylenol has helped relieve the sx and occasionally takes them, but she thinks it is mostly occurring due to position or activity. She reports her pain as being maybe a 1, and reports if it was really bothering her she would make a separate appointment. On exam, rotating her arm elicited slight pain.  Assessment and Plan - Ddx for her pain includes rotator cuff tendinitis vs shoulder bursitis which is less likely since there was no swelling, other less likely causes could be thoracic outlet syndrome or subclavian  artery compression. Given only shoulder pain and no other associating sx, we do not think she needs additional testing at this time.   Plan: closely monitor sx and takes tylenol as needed   Colon cancer screening Ms. Stacy Anderson is due for her colonoscopy. She had a negative FIT test on 02/25/2020. She did not want to get a colonoscopy and was okay with getting a FIT test instead. She was educated by the lab technicians on how to properly collect and bring the samples back to clinic.   Plan  - Get FIT testing   Cervical cancer screening Ms. Stacy Anderson has previously denied pap smear and has denied it today. She says she is not scared or nervous but she just does not want them today. She agreed she will definitely do it next time.   Plan  - Pap smear with co-testing in next visit.  Patient discussed with Dr. Oswaldo Done

## 2023-01-15 NOTE — Assessment & Plan Note (Addendum)
Stacy Anderson has a hx of asymmetric blood pressure. She denies any SOB, dizziness, chest palpitations, blurry vision, n/v, or jaw claudications. She does endorse left shoulder pain which has not bothered her as much. She is adherent to her bp medications including amlodipine and lisinopril and takes them every morning. She reports she checks her bp every morning at home and they usually read around 130/90s. She did not have her monitor with her today. In clinic, her first bp was 158/102 on her right arm. When it was rechecked it was 186/102 on right arm and 123/97 on left. She was previously sent to vascular in 2022 and had a arterial doppler done which did not show any stenosis. It was suspected the stenosis could be proximal to what was done. She lost follow up with vascular.  A/P  She could be having arterial stenosis vs PAD vs subclavian artery compression. CT may be appropriate to figure out the discrepancies between her bp. Since she reports her home bp has been okay we want to wait until she gets her CT scan to make changes in her bp medications. Advised her to bring her monitoring device for her follow up appointment.  Plan  - Order CT  - continue current dose of 5 mg amlodipine and 10 mg lisinopril

## 2023-01-15 NOTE — Progress Notes (Unsigned)
HTN BP today ***, with recheck ***. Current regimen is amlodipine 5 mg daily and lisinopril 10 mg daily which she is*** compliant with. Last BMP ***. Plan:  L ICA occlusion Tongue cancer Hearing loss  HLD Last lipid panel 06/2022 with LDL 84. Current regimen is rosuvastatin 10 mg daily. Plan: Recheck lipid panel***. Continue rosuvastatin 10 mg daily.  HCM: Colonoscopy

## 2023-01-17 NOTE — Progress Notes (Signed)
 Internal Medicine Clinic Attending  Case discussed with the resident physician at the time of the visit.  We reviewed the patient's history, exam, and pertinent patient test results.  I agree with the assessment, diagnosis, and plan of care documented in the resident's note.

## 2023-01-17 NOTE — Addendum Note (Signed)
Addended by: Ihor Dow on: 01/17/2023 09:06 AM   Modules accepted: Orders

## 2023-02-01 ENCOUNTER — Telehealth: Payer: Self-pay | Admitting: Internal Medicine

## 2023-02-01 NOTE — Telephone Encounter (Signed)
Patient was contacted via telephone at the request of Dr. Champ Mungo to cancel her appointment for tomorrow 02/02/23 at 10:45 am since she has not had her CT done.  No answer left message to please call the clinic back as soon as she receives the message.

## 2023-02-02 ENCOUNTER — Encounter: Payer: No Typology Code available for payment source | Admitting: Internal Medicine

## 2023-02-28 ENCOUNTER — Other Ambulatory Visit (HOSPITAL_COMMUNITY): Payer: Self-pay

## 2023-02-28 ENCOUNTER — Other Ambulatory Visit: Payer: Self-pay

## 2023-02-28 DIAGNOSIS — I10 Essential (primary) hypertension: Secondary | ICD-10-CM

## 2023-02-28 MED ORDER — LISINOPRIL 10 MG PO TABS
10.0000 mg | ORAL_TABLET | Freq: Every day | ORAL | 3 refills | Status: DC
  Filled 2023-02-28: qty 90, 90d supply, fill #0

## 2023-02-28 MED ORDER — AMLODIPINE BESYLATE 5 MG PO TABS
5.0000 mg | ORAL_TABLET | Freq: Every day | ORAL | 3 refills | Status: DC
Start: 2023-02-28 — End: 2024-02-01
  Filled 2023-02-28: qty 90, 90d supply, fill #0
  Filled 2023-05-24: qty 90, 90d supply, fill #1
  Filled 2023-08-23: qty 90, 90d supply, fill #2
  Filled 2023-11-06: qty 90, 90d supply, fill #3

## 2023-02-28 MED ORDER — ROSUVASTATIN CALCIUM 10 MG PO TABS
10.0000 mg | ORAL_TABLET | Freq: Every day | ORAL | 3 refills | Status: DC
  Filled 2023-02-28: qty 90, 90d supply, fill #0
  Filled 2023-05-24: qty 90, 90d supply, fill #1
  Filled 2023-08-23: qty 90, 90d supply, fill #2
  Filled 2023-11-06: qty 90, 90d supply, fill #3

## 2023-03-08 ENCOUNTER — Ambulatory Visit (HOSPITAL_COMMUNITY)
Admission: RE | Admit: 2023-03-08 | Discharge: 2023-03-08 | Disposition: A | Discharge status: Home / Self Care | Payer: No Typology Code available for payment source | Source: Ambulatory Visit | Attending: Student in an Organized Health Care Education/Training Program | Admitting: Student in an Organized Health Care Education/Training Program

## 2023-03-08 DIAGNOSIS — I998 Other disorder of circulatory system: Secondary | ICD-10-CM

## 2023-03-08 MED ORDER — IOHEXOL 350 MG/ML SOLN
75.0000 mL | Freq: Once | INTRAVENOUS | Status: AC | PRN
  Administered 2023-03-08: 75 mL via INTRAVENOUS

## 2023-03-21 ENCOUNTER — Telehealth: Payer: Self-pay | Admitting: Internal Medicine

## 2023-03-21 DIAGNOSIS — R911 Solitary pulmonary nodule: Secondary | ICD-10-CM | POA: Insufficient documentation

## 2023-03-21 DIAGNOSIS — G540 Brachial plexus disorders: Secondary | ICD-10-CM | POA: Insufficient documentation

## 2023-03-21 NOTE — Telephone Encounter (Signed)
Attempted to contact patient to review results of recent CTA as follows--  Arterial thoracic outlet syndrome--with likelihood for recurrent thrombosis being high, I will place referral to vascular surgery for evaluation.   Will need non-contrast chest CT at 6-12 months to follow up6 mm nodule in R lung apex.  I will make additional attempts to contact Stacy Anderson Order for future non-contrast chest CT and vascular surgery referral have been placed.  Champ Mungo, DO

## 2023-04-12 NOTE — Progress Notes (Signed)
Patient name: Stacy Anderson MRN: [id number] DOB: [birth year] Sex: female  REASON FOR CONSULT: Arterial thoracic outlet  HPI: Stacy Anderson is a 57 y.o. 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  In further reading the notes she had a blood pressure discrepancy in the medicine teaching clinic and was sent for CTA.  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 She denies any left arm pain including muscle aches and denies any hand pain in the left hand or tissue loss.  States she gets some intermittent pain in both shoulders.  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   Annotation: dx at age 32 Qualifier: Diagnosis of  By: Daphine Deutscher FNP, Nykedtra     Malignant tumor of maxillary sinus (HCC) 1989   treated with chemotherapy and radiation   Sensorineural hearing loss of both ears    likely secondary to the radiation therapy, chemotherapy and the vascular flow issues   Unspecified hearing loss 05/05/2010   Annotation: bil hearing aids since age 26 - nasal cancer Qualifier: Diagnosis of  By: Daphine Deutscher FNP, Nykedtra     UNSPECIFIED SLEEP DISTURBANCE 05/05/2010   Qualifier: Diagnosis of  By: Daphine Deutscher FNP, Zena Amos      Past Surgical History:  Procedure Laterality Date   DIRECT LARYNGOSCOPY N/A 02/10/2015   Procedure: DIRECT LARYNGOSCOPY WITH BIOPSY;  Surgeon:  Serena Colonel, MD;  Location: [facility] OR;  Service: ENT;  Laterality: N/A;   ESOPHAGOGASTRODUODENOSCOPY N/A 11/01/2012   Procedure: ESOPHAGOGASTRODUODENOSCOPY (EGD);  Surgeon: Theda Belfast, MD;  Location: [facility] ENDOSCOPY;  Service: Endoscopy;  Laterality: N/A;   EXPLORATORY TYMPANOTOMY Right 06/05/2007   Hermelinda Medicus, M.D.   FRACTURE SURGERY Left    hip   TUBAL LIGATION Bilateral 04/03/2003   Dr. Sylvester Harder    Family History  Problem Relation Age of Onset   Hypertension Mother    Heart disease Mother    Diabetes Father    Diabetes Sister    Cancer Brother 80       Brain cancer     SOCIAL HISTORY: Social History   Socioeconomic History   Marital status: Single    Spouse name: Not on file   Number of children: 0   Years of education: 12th grade   Highest education level: Not on file  Occupational History   Occupation: Multimedia programmer: [employer]  Tobacco Use   Smoking status: Former    Current packs/day: 0.00    Average packs/day: 0.3 packs/day for 20.0 years (5.0 ttl pk-yrs)    Types: Cigarettes    Start date: 09/07/1992    Quit date: 09/07/2012    Years since quitting: 10.6   Smokeless tobacco: Never  Substance and Sexual Activity   Alcohol use: No   Drug use: No   Sexual activity: Not on file  Other  Topics Concern   Not on file  Social History Narrative   Lives in [place], alone   Social Determinants of Health   Financial Resource Strain: Not on file  Food Insecurity: Not on file  Transportation Needs: Not on file  Physical Activity: Not on file  Stress: Not on file  Social Connections: Not on file  Intimate Partner Violence: Not on file    Allergies  Allergen Reactions   Asa [Aspirin] Other (See Comments)    Nose bleed.   Aleve [Naproxen Sodium] Rash    Current Outpatient Medications  Medication Sig Dispense Refill   acetaminophen (TYLENOL) 500 MG tablet Take 2 tablets (1,000 mg total) by mouth 2 (two) times daily. 30 tablet 0   amLODipine  (NORVASC) 5 MG tablet Take 1 tablet (5 mg total) by mouth daily. 90 tablet 3   lisinopril (ZESTRIL) 10 MG tablet Take 1 tablet (10 mg total) by mouth daily. 90 tablet 3   omeprazole (PRILOSEC) 20 MG capsule Take 20 mg by mouth daily.     rosuvastatin (CRESTOR) 10 MG tablet Take 1 tablet (10 mg total) by mouth daily. 90 tablet 3   No current facility-administered medications for this visit.    REVIEW OF SYSTEMS:  [X]  denotes positive finding, [ ]  denotes negative finding Cardiac  Comments:  Chest pain or chest pressure:    Shortness of breath upon exertion:    Short of breath when lying flat:    Irregular heart rhythm:        Vascular    Pain in calf, thigh, or hip brought on by ambulation:    Pain in feet at night that wakes you up from your sleep:     Blood clot in your veins:    Leg swelling:         Pulmonary    Oxygen at home:    Productive cough:     Wheezing:         Neurologic    Sudden weakness in arms or legs:     Sudden numbness in arms or legs:     Sudden onset of difficulty speaking or slurred speech:    Temporary loss of vision in one eye:     Problems with dizziness:         Gastrointestinal    Blood in stool:     Vomited blood:         Genitourinary    Burning when urinating:     Blood in urine:        Psychiatric    Major depression:         Hematologic    Bleeding problems:    Problems with blood clotting too easily:        Skin    Rashes or ulcers:        Constitutional    Fever or chills:      PHYSICAL EXAM: There were no vitals filed for this visit.  GENERAL: The patient is a well-nourished female, in no acute distress. The vital signs are documented above. CARDIAC: There is a regular rate and rhythm.  VASCULAR:  Right radial pulse palpable Left radial pulse nonpalpable No upper extremity tissue loss PULMONARY: No respiratory distress. ABDOMEN: Soft and non-tender. MUSCULOSKELETAL: There are no major deformities or  cyanosis. NEUROLOGIC: No focal weakness or paresthesias are detected. SKIN: There are no ulcers or rashes noted. PSYCHIATRIC: The patient has a normal affect.  DATA:   CTA chest reviewed from 03/08/2023  with short segment occlusion of left subclavian artery at the thoracic outlet over the first rib  Assessment/Plan:  57 y.o. 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 In further reading the notes she had a blood pressure discrepancy in the medicine teaching clinic and was sent for CTA.  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  Her left subclavian artery occlusion is asymptomatic and she has no arm claudication and no tissue loss that would warrant intervention.  I discussed she should check her blood pressure in the right arm as this would be much more accurate.  I suspect her left subclavian artery occlusion is chronic as she had a arterial duplex of her arm 2021 showing monophasic flow suggesting significant proximal lesion.  I will arrange follow-up in 1 year in the [place] clinic.  If she ever required revascularization of her subclavian occlusion I do not think stenting would be viable given the location and likely would require open surgical bypass but again I would not do this unless she develops symptoms.  Her only complaint today is some intermittent pain in both shoulders that sounds joint related.  I discussed her taking a baby aspirin but she cannot due to history of nosebleeds with her cancer.   Cephus Shelling, MD [facility] Office: [phone]

## 2023-04-13 ENCOUNTER — Ambulatory Visit (INDEPENDENT_AMBULATORY_CARE_PROVIDER_SITE_OTHER): Payer: No Typology Code available for payment source | Admitting: Vascular Surgery

## 2023-04-13 ENCOUNTER — Encounter: Payer: Self-pay | Admitting: Vascular Surgery

## 2023-04-13 VITALS — BP 156/104 | HR 77 | Temp 97.3°F | Ht 67.0 in | Wt 190.2 lb

## 2023-04-13 DIAGNOSIS — I708 Atherosclerosis of other arteries: Secondary | ICD-10-CM | POA: Insufficient documentation

## 2023-04-16 ENCOUNTER — Encounter: Payer: Self-pay | Admitting: Internal Medicine

## 2023-04-16 ENCOUNTER — Other Ambulatory Visit (HOSPITAL_COMMUNITY): Payer: Self-pay

## 2023-04-16 ENCOUNTER — Ambulatory Visit: Payer: No Typology Code available for payment source | Admitting: Student

## 2023-04-16 ENCOUNTER — Other Ambulatory Visit (HOSPITAL_COMMUNITY)
Admission: RE | Admit: 2023-04-16 | Discharge: 2023-04-16 | Disposition: A | Discharge status: Home / Self Care | Payer: No Typology Code available for payment source | Source: Ambulatory Visit | Attending: Internal Medicine | Admitting: Internal Medicine

## 2023-04-16 VITALS — BP 150/100 | HR 72 | Temp 98.2°F | Wt 194.2 lb

## 2023-04-16 DIAGNOSIS — Z124 Encounter for screening for malignant neoplasm of cervix: Secondary | ICD-10-CM | POA: Diagnosis present

## 2023-04-16 DIAGNOSIS — I10 Essential (primary) hypertension: Secondary | ICD-10-CM

## 2023-04-16 DIAGNOSIS — Z0000 Encounter for general adult medical examination without abnormal findings: Secondary | ICD-10-CM

## 2023-04-16 DIAGNOSIS — Z1211 Encounter for screening for malignant neoplasm of colon: Secondary | ICD-10-CM

## 2023-04-16 MED ORDER — LISINOPRIL 20 MG PO TABS
20.0000 mg | ORAL_TABLET | Freq: Every day | ORAL | 3 refills | Status: DC
Start: 2023-04-16 — End: 2024-05-06
  Filled 2023-04-16: qty 90, 90d supply, fill #0
  Filled 2023-08-02: qty 90, 90d supply, fill #1
  Filled 2023-11-06: qty 90, 90d supply, fill #2
  Filled 2024-01-31: qty 90, 90d supply, fill #3

## 2023-04-16 NOTE — Patient Instructions (Addendum)
Thank you so much for coming to the clinic today!   Today, we discussed your hypertension and healthcare maintenance.  For your hypertension, we increased her lisinopril from 10 mg/day to 20 mg/day.  If you start to develop a new dry cough, please let us know.  Please continue to take your amlodipine 5 mg/day as you have been doing.  Today, you have also received a Pap smear.  We will follow-up with these results when they come back.  If you have any questions please feel free to the call the clinic at anytime at [phone]. It was a pleasure seeing you!  Best, Dr. Rayvon Char

## 2023-04-16 NOTE — Assessment & Plan Note (Addendum)
Patient noted to have had a blood pressure discrepancy in the clinic so was sent for a CTA.  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  She was seen by vascular surgery on 10/25 who noted her left subclavian artery occlusion is asymptomatic and that she has no arm claudication and no tissue loss that would not warrant intervention.  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  She is scheduled to follow-up in 1 year.  Of note, the patient is also intolerant of aspirin as she has a history of nosebleeds secondary to her nasopharyngeal cancer. Today, blood pressure 148/99. Patient records her BP at home, and it is typically in 140s/80. She denies HA, N/V, vision changes, or other symptoms. She is currently on lisinopril 10 mg and amlodipine 5 mg. We will increase her lisinopril dosage to 20 mg/day and follow-up in month to re-evaluate.  -Increase lisinopril to 20 mg/day -Continue amlodipine 5 mg

## 2023-04-16 NOTE — Assessment & Plan Note (Signed)
Patient received her Pap smear today.  She also noted to have received her flu shot and COVID shot in September.  Patient also completed her colonoscopy referral.

## 2023-04-16 NOTE — Progress Notes (Signed)
CC: Hypertension  HPI: [name] is a 57 y.o. female living with a history stated below and presents today for blood pressure follow-up. Please see problem based assessment and plan for additional details.  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   Annotation: dx at age 23 Qualifier: Diagnosis of  By: Daphine Deutscher FNP, Nykedtra     Malignant tumor of maxillary sinus (HCC) 1989   treated with chemotherapy and radiation   Sensorineural hearing loss of both ears    likely secondary to the radiation therapy, chemotherapy and the vascular flow issues   Unspecified hearing loss 05/05/2010   Annotation: bil hearing aids since age 63 - nasal cancer Qualifier: Diagnosis of  By: Daphine Deutscher FNP, Nykedtra     UNSPECIFIED SLEEP DISTURBANCE 05/05/2010   Qualifier: Diagnosis of  By: Daphine Deutscher FNP, Zena Amos      Current Outpatient Medications on File Prior to Visit  Medication Sig Dispense Refill   acetaminophen (TYLENOL) 500 MG tablet Take 2 tablets (1,000 mg total) by mouth 2 (two) times daily. 30 tablet 0   amLODipine (NORVASC) 5 MG tablet Take 1 tablet (5 mg total) by mouth daily. 90 tablet 3   omeprazole (PRILOSEC) 20 MG capsule Take 20 mg by mouth daily.     rosuvastatin (CRESTOR) 10 MG tablet Take 1 tablet (10 mg total) by mouth daily. 90 tablet 3   No current facility-administered medications on file prior to visit.    Family History  Problem Relation Age of Onset   Hypertension Mother    Heart disease Mother    Diabetes Father    Diabetes Sister     Cancer Brother 46       Brain cancer     Social History   Socioeconomic History   Marital status: Single    Spouse name: Not on file   Number of children: 0   Years of education: 12th grade   Highest education level: Not on file  Occupational History   Occupation: Multimedia programmer: [employer]  Tobacco Use   Smoking status: Former    Current packs/day: 0.00    Average packs/day: 0.3 packs/day for 20.0 years (5.0 ttl pk-yrs)    Types: Cigarettes    Start date: 09/07/1992    Quit date: 09/07/2012    Years since quitting: 10.6   Smokeless tobacco: Never  Vaping Use   Vaping status: Never Used  Substance and Sexual Activity   Alcohol use: No   Drug use: No   Sexual activity: Not on file  Other Topics Concern   Not on file  Social History Narrative   Lives in [place], alone   Social Determinants of Health   Financial Resource Strain: Not on file  Food Insecurity: Not on file  Transportation Needs: Not on file  Physical Activity: Not on file  Stress: Not on file  Social Connections: Not on file  Intimate Partner Violence: Not on file    Review of Systems: ROS negative except  for what is noted on the assessment and plan.  Vitals:   04/16/23 1535 04/16/23 1618  BP: (!) 148/99 (!) 150/100  Pulse: 80 72  Temp: 98.2 F (36.8 C)   TempSrc: Oral   SpO2: 98%   Weight: 194 lb 3.2 oz (88.1 kg)     Physical Exam Constitutional:      Appearance: Normal appearance.  HENT:     Head: Normocephalic and atraumatic.  Cardiovascular:     Rate and Rhythm: Normal rate and regular rhythm.     Pulses: Normal pulses.     Heart sounds: Normal heart sounds.  Pulmonary:     Effort: Pulmonary effort is normal.     Breath sounds: Normal breath sounds.  Abdominal:     General: Abdomen is flat.     Palpations: Abdomen is soft.  Neurological:     General: No focal deficit present.     Mental Status: She is alert.  Psychiatric:        Mood and Affect: Mood normal.         Behavior: Behavior normal.    Assessment & Plan:   Essential hypertension Patient noted to have had a blood pressure discrepancy in the clinic so was sent for a CTA.  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  She was seen by vascular surgery on 10/25 who noted her left subclavian artery occlusion is asymptomatic and that she has no arm claudication and no tissue loss that would not warrant intervention.  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  She is scheduled to follow-up in 1 year.  Of note, the patient is also intolerant of aspirin as she has a history of nosebleeds secondary to her nasopharyngeal cancer. Today, blood pressure 148/99. Patient records her BP at home, and it is typically in 140s/80. She denies HA, N/V, vision changes, or other symptoms. She is currently on lisinopril 10 mg and amlodipine 5 mg. We will increase her lisinopril dosage to 20 mg/day and follow-up in month to re-evaluate.  -Increase lisinopril to 20 mg/day -Continue amlodipine 5 mg  Healthcare maintenance Patient received her Pap smear today.  She also noted to have received her flu shot and COVID shot in September.  Patient also completed her colonoscopy referral.  Patient seen with Dr. Susa Raring, MD  [facility] Internal Medicine, PGY-1 Date 04/16/2023 Time 4:25 PM

## 2023-04-17 NOTE — Addendum Note (Signed)
Addended by: Derrek Monaco on: 04/17/2023 02:32 PM   Modules accepted: Level of Service

## 2023-04-17 NOTE — Progress Notes (Signed)
Internal Medicine Clinic Attending  I was physically present during the key portions of the resident provided service and participated in the medical decision making of patient's management care. I reviewed pertinent patient test results.  The assessment, diagnosis, and plan were formulated together and I agree with the documentation in the resident's note.  Mercie Eon, MD    I performed patient's pap today. Cervical os was visualized and appeared normal.

## 2023-04-18 ENCOUNTER — Telehealth: Payer: Self-pay | Admitting: *Deleted

## 2023-04-18 LAB — CYTOLOGY - PAP
Adequacy: ABSENT
Comment: NEGATIVE
Diagnosis: NEGATIVE
High risk HPV: NEGATIVE

## 2023-04-18 LAB — FECAL OCCULT BLOOD, IMMUNOCHEMICAL: Fecal Occult Bld: NEGATIVE

## 2023-04-18 NOTE — Telephone Encounter (Signed)
Called patient left voice message regarding mammogram appointment for 05-16-2023 @ 1:40 pm to arrive 1:30 p. If unable to keep this appointment to call breast center at [phone] fail to do so will be a 75.00 no show

## 2023-04-20 NOTE — Progress Notes (Signed)
Send patient message in MyChart discussing Pap smear.  Because patient received her Pap smear with cotesting for cytology and HPV testing, she is not due for repeat for 5 years.

## 2023-05-16 ENCOUNTER — Ambulatory Visit
Admission: RE | Admit: 2023-05-16 | Discharge: 2023-05-16 | Disposition: A | Discharge status: Home / Self Care | Payer: No Typology Code available for payment source | Source: Ambulatory Visit | Attending: Student in an Organized Health Care Education/Training Program | Admitting: Student in an Organized Health Care Education/Training Program

## 2023-05-16 DIAGNOSIS — Z1231 Encounter for screening mammogram for malignant neoplasm of breast: Secondary | ICD-10-CM

## 2023-05-25 ENCOUNTER — Other Ambulatory Visit (HOSPITAL_COMMUNITY): Payer: Self-pay

## 2023-06-05 ENCOUNTER — Ambulatory Visit
Admission: RE | Admit: 2023-06-05 | Discharge: 2023-06-05 | Disposition: A | Discharge status: Home / Self Care | Payer: No Typology Code available for payment source | Source: Ambulatory Visit | Attending: Student in an Organized Health Care Education/Training Program

## 2024-01-31 ENCOUNTER — Other Ambulatory Visit: Payer: Self-pay

## 2024-02-01 ENCOUNTER — Other Ambulatory Visit (HOSPITAL_COMMUNITY): Payer: Self-pay

## 2024-02-01 ENCOUNTER — Other Ambulatory Visit: Payer: Self-pay

## 2024-02-01 ENCOUNTER — Other Ambulatory Visit: Payer: Self-pay | Admitting: Internal Medicine

## 2024-02-01 DIAGNOSIS — I10 Essential (primary) hypertension: Secondary | ICD-10-CM

## 2024-02-01 NOTE — Telephone Encounter (Unsigned)
 Copied from CRM #[id number]. Topic: Clinical - Medication Refill >> Feb 01, 2024  3:13 PM Suzette B wrote: Medication:  rosuvastatin  (CRESTOR ) 10 MG tablet omeprazole  (PRILOSEC) 20 MG capsule  Has the patient contacted their pharmacy? No: patient stated that she had not called the pharmacy she just remember to call her and make an appt due to the note she received for a follow up     This is the patient's preferred pharmacy:  [facility] [address] [place] [address] Phone: [phone] Fax: [phone]  Is this the correct pharmacy for this prescription? Yes If no, delete pharmacy and type the correct one.   Has the prescription been filled recently? No  Is the patient out of the medication? Yes  Has the patient been seen for an appointment in the last year OR does the patient have an upcoming appointment? Yes  Can we respond through MyChart? Yes  Agent: Please be advised that Rx refills may take up to 3 business days. We ask that you follow-up with your pharmacy.

## 2024-02-01 NOTE — Telephone Encounter (Signed)
 Patient last seen 04/16/23 I called the patient to schedule a follow up appointment. Unable to reach the patient, lvm for patient to give us  a call back.

## 2024-02-04 ENCOUNTER — Other Ambulatory Visit (HOSPITAL_COMMUNITY): Payer: Self-pay

## 2024-02-04 ENCOUNTER — Other Ambulatory Visit: Payer: Self-pay

## 2024-02-04 MED ORDER — AMLODIPINE BESYLATE 5 MG PO TABS
5.0000 mg | ORAL_TABLET | Freq: Every day | ORAL | 3 refills | Status: AC
  Filled 2024-02-04: qty 90, 90d supply, fill #0
  Filled 2024-05-04: qty 90, 90d supply, fill #1

## 2024-02-04 MED ORDER — OMEPRAZOLE 20 MG PO CPDR
20.0000 mg | DELAYED_RELEASE_CAPSULE | Freq: Every day | ORAL | 0 refills | Status: AC
  Filled 2024-02-04: qty 30, 30d supply, fill #0

## 2024-02-04 MED ORDER — ROSUVASTATIN CALCIUM 10 MG PO TABS
10.0000 mg | ORAL_TABLET | Freq: Every day | ORAL | 3 refills | Status: AC
  Filled 2024-02-04: qty 90, 90d supply, fill #0
  Filled 2024-05-04: qty 90, 90d supply, fill #1

## 2024-02-04 MED ORDER — ROSUVASTATIN CALCIUM 10 MG PO TABS
10.0000 mg | ORAL_TABLET | Freq: Every day | ORAL | 0 refills | Status: AC
  Filled 2024-02-04: qty 30, 30d supply, fill #0

## 2024-02-04 NOTE — Telephone Encounter (Signed)
 Medication sent to pharmacy

## 2024-02-05 ENCOUNTER — Other Ambulatory Visit (HOSPITAL_COMMUNITY): Payer: Self-pay

## 2024-02-07 ENCOUNTER — Other Ambulatory Visit (HOSPITAL_COMMUNITY): Payer: Self-pay

## 2024-02-14 ENCOUNTER — Other Ambulatory Visit: Payer: Self-pay

## 2024-02-14 ENCOUNTER — Ambulatory Visit: Payer: Self-pay

## 2024-02-14 VITALS — BP 124/71 | HR 73 | Temp 98.0°F | Ht 68.0 in | Wt 188.6 lb

## 2024-02-14 DIAGNOSIS — H903 Sensorineural hearing loss, bilateral: Secondary | ICD-10-CM

## 2024-02-14 DIAGNOSIS — E785 Hyperlipidemia, unspecified: Secondary | ICD-10-CM

## 2024-02-14 DIAGNOSIS — I10 Essential (primary) hypertension: Secondary | ICD-10-CM

## 2024-02-14 DIAGNOSIS — D7589 Other specified diseases of blood and blood-forming organs: Secondary | ICD-10-CM | POA: Diagnosis not present

## 2024-02-14 DIAGNOSIS — Z1211 Encounter for screening for malignant neoplasm of colon: Secondary | ICD-10-CM

## 2024-02-14 DIAGNOSIS — Z131 Encounter for screening for diabetes mellitus: Secondary | ICD-10-CM

## 2024-02-14 LAB — GLUCOSE, CAPILLARY: Glucose-Capillary: 83 mg/dL (ref 70–99)

## 2024-02-14 LAB — POCT GLYCOSYLATED HEMOGLOBIN (HGB A1C): Hemoglobin A1C: 5.5 % (ref 4.0–5.6)

## 2024-02-14 NOTE — Assessment & Plan Note (Signed)
 Patient completed a fit test for colon cancer screening last 03/2023, results were negative.  Patient denies any changes in bowel movement, it is normal for her to go daily.  Patient does not want to complete bowel prep for colonoscopy.  Patient elected to do the Cologuard, order placed and discussed that testing will be mailed to patient with instructions.  Discussed that this test is every 3 years. Orders:   Cologuard

## 2024-02-14 NOTE — Progress Notes (Signed)
 Internal Medicine Clinic Attending  I was physically present during the key portions of the resident provided service and participated in the medical decision making of patient's management care. I reviewed pertinent patient test results.  The assessment, diagnosis, and plan were formulated together and I agree with the documentation in the resident's note.  Francesco Elsie NOVAK, MD

## 2024-02-14 NOTE — Assessment & Plan Note (Signed)
 Patient had a normal Pap smear in 03/2023.  Because this Pap smear was with cotesting for cytology and HPV, she is not due for repeat for 5 years. Patient gets mammograms every year.  Discussed that she will schedule her mammogram for this December. Patient routinely gets the flu shot.  Discussed that she is also a candidate for shingles.  Stated she had chickenpox as a child.  Patient planning to get flu and shingles vaccine soon at her local [facility].  Patient understands to send us  a copy of the vaccination records so we can update it in her chart. Patient also needs new dentures.  Patient has insurance patient call her dentist to arrange this appointment.  Patient in agreement.

## 2024-02-14 NOTE — Progress Notes (Signed)
 Established Patient Office Visit  Subjective   Patient ID: Stacy Anderson, female    DOB: [birth year]  Age: 58 y.o. MRN: [id number]  Patient presents for annual checkup.  Last time she was seen in October 2024.  Patient doing well, still working, tries to stay active, eats a variety of healthy foods, enjoys spending time with her sisters and friends.  Today she has nothing of concern, would like labs because it has been a while since she had some and she has a family history of diabetes.  She does not need any refills at this time.    Past Surgical History:  Procedure Laterality Date   DIRECT LARYNGOSCOPY N/A 02/10/2015   Procedure: DIRECT LARYNGOSCOPY WITH BIOPSY;  Surgeon: Ida Loader, MD;  Location: [facility] OR;  Service: ENT;  Laterality: N/A;   ESOPHAGOGASTRODUODENOSCOPY N/A 11/01/2012   Procedure: ESOPHAGOGASTRODUODENOSCOPY (EGD);  Surgeon: Belvie JONETTA Just, MD;  Location: [facility] ENDOSCOPY;  Service: Endoscopy;  Laterality: N/A;   EXPLORATORY TYMPANOTOMY Right 06/05/2007   Lynwood Genet, M.D.   FRACTURE SURGERY Left    hip   TUBAL LIGATION Bilateral 04/03/2003   Dr. Lynwood Ku        Objective:     BP 124/71 (BP Location: Right Arm, Cuff Size: Large)   Pulse 73   Temp 98 F (36.7 C) (Oral)   Ht 5' 8 (1.727 m)   Wt 188 lb 9.6 oz (85.5 kg)   LMP 03/31/2013   SpO2 100%   BMI 28.68 kg/m  BP Readings from Last 3 Encounters:  02/14/24 124/71  04/16/23 (!) 150/100  04/13/23 (!) 156/104   Wt Readings from Last 3 Encounters:  02/14/24 188 lb 9.6 oz (85.5 kg)  04/16/23 194 lb 3.2 oz (88.1 kg)  04/13/23 190 lb 3.2 oz (86.3 kg)      Physical Exam Vitals reviewed.  Constitutional:      Appearance: Normal appearance.  HENT:     Nose: Nose normal.     Mouth/Throat:     Mouth: Mucous membranes are moist.     Comments: jaw range of motion limited due to surgical scarring, opens mouth about 50% Left and right motion of tongue limited Neck:     Comments: Neck range of motion  limited due to surgical scarring Tenderness to light palpation under right side chin Cardiovascular:     Rate and Rhythm: Normal rate and regular rhythm.     Pulses: Normal pulses.     Heart sounds: Normal heart sounds.  Pulmonary:     Effort: Pulmonary effort is normal.     Breath sounds: Normal breath sounds. No wheezing.  Abdominal:     General: Bowel sounds are normal.     Palpations: Abdomen is soft.     Tenderness: There is no abdominal tenderness. There is no rebound.     Hernia: No hernia is present.  Musculoskeletal:     Cervical back: Tenderness present.     Right lower leg: No edema.     Left lower leg: No edema.  Skin:    General: Skin is warm and dry.  Neurological:     General: No focal deficit present.     Mental Status: She is alert and oriented to person, place, and time.     Motor: No weakness.  Psychiatric:        Mood and Affect: Mood normal.      Results for orders placed or performed in visit on 02/14/24  Glucose, capillary  Result Value Ref Range   Glucose-Capillary 83 70 - 99 mg/dL    Last CBC Lab Results  Component Value Date   WBC 11.3 (H) 12/02/2018   HGB 12.9 12/02/2018   HCT 38.6 12/02/2018   HCT 38.1 12/02/2018   MCV 106.0 (H) 12/02/2018   MCH 35.4 (H) 12/02/2018   RDW 12.3 12/02/2018   PLT 322 12/02/2018   Last metabolic panel Lab Results  Component Value Date   GLUCOSE 72 07/10/2022   NA 140 07/10/2022   K 4.1 07/10/2022   CL 104 07/10/2022   CO2 19 (L) 07/10/2022   BUN 23 07/10/2022   CREATININE 0.92 07/10/2022   EGFR 73 07/10/2022   CALCIUM  9.4 07/10/2022   PROT 6.7 08/27/2020   ALBUMIN 4.1 08/27/2020   LABGLOB 2.6 08/27/2020   AGRATIO 1.6 08/27/2020   BILITOT 0.3 08/27/2020   ALKPHOS 72 08/27/2020   AST 17 08/27/2020   ALT 10 08/27/2020   ANIONGAP 12 04/15/2015   Last lipids Lab Results  Component Value Date   CHOL 151 07/10/2022   HDL 47 07/10/2022   LDLCALC 84 07/10/2022   TRIG 109 07/10/2022   CHOLHDL  3.2 07/10/2022   Last hemoglobin A1c Lab Results  Component Value Date   HGBA1C 5.5 02/14/2024      The 10-year ASCVD risk score (Arnett DK, et al., 2019) is: 4.5%    Assessment & Plan:   Assessment & Plan Hyperlipidemia, unspecified hyperlipidemia type Patient has been taking rosuvastatin  10 mg daily since 02/2020.  Patient tolerates this medicine well.  Last lipid profile done in 06/2022 and was within normal limits with LDL 84.  Plan to continue current regimen.  Will recheck lipid panel today.  Consider increasing to 20 mg daily if LDL above 100.  LDL goal less than 100 for adults with moderate CV risk.  Otherwise, have patient return in 1 year for follow-up.  Orders:   Basic metabolic panel with GFR   Lipid Profile  Healthcare maintenance Patient had a normal Pap smear in 03/2023.  Because this Pap smear was with cotesting for cytology and HPV, she is not due for repeat for 5 years. Patient gets mammograms every year.  Discussed that she will schedule her mammogram for this December. Patient routinely gets the flu shot.  Discussed that she is also a candidate for shingles.  Stated she had chickenpox as a child.  Patient planning to get flu and shingles vaccine soon at her local [facility].  Patient understands to send us  a copy of the vaccination records so we can update it in her chart. Patient also needs new dentures.  Patient has insurance patient call her dentist to arrange this appointment.  Patient in agreement.    Colon cancer screening Patient completed a fit test for colon cancer screening last 03/2023, results were negative.  Patient denies any changes in bowel movement, it is normal for her to go daily.  Patient does not want to complete bowel prep for colonoscopy.  Patient elected to do the Cologuard, order placed and discussed that testing will be mailed to patient with instructions.  Discussed that this test is every 3 years. Orders:   Cologuard  Primary  hypertension Initially patient blood pressure 148/80, 3 minutes later on recheck 124/71.  Patient checks her blood pressures at home and says they have been normal at home.  Patient was to call us  if her blood pressures are consecutive days in a row.  Patient stays active through  work and in her garden, eats a variety of vegetables food and does not fry food.  Patient denies changes in vision.  Patient tolerating 20 mg lisinopril  daily and 5 mg amlodipine  daily well.  Plan to continue this regimen.  Will get BMP today and plan to follow-up in a year unless labs suggest endorgan damage. Hearing loss sensory, bilateral Patient states she needs new hearing aids, she has had hers for many years and they are starting to not work as well.  She is able to partake in conversation today but does not hear when someone enters the room.  Patient seems to rely on lipreading.  Among others, the patient lives alone and being able to hear is important for safety concerns as well as mental health.   Orders:   Ambulatory referral to Audiology   Return in about 1 year (around 02/13/2025) for annual check up.    Viktoria King, DO

## 2024-02-14 NOTE — Assessment & Plan Note (Signed)
 Patient states she needs new hearing aids, she has had hers for many years and they are starting to not work as well.  She is able to partake in conversation today but does not hear when someone enters the room.  Patient seems to rely on lipreading.  Among others, the patient lives alone and being able to hear is important for safety concerns as well as mental health.   Orders:   Ambulatory referral to Audiology

## 2024-02-14 NOTE — Patient Instructions (Addendum)
 It was wonderful seeing you today!   Please remember to...  1) schedule your mammogram for this December  2) someone from audiology will reach out to help you get new hearing aids  3) get your shingles vaccine and flu shot  4) continue taking your medications as prescribed and continue checking your blood pressure at home  5) complete your colon cancer screen test, called Cologuard. This will be mailed to you with instructions.   If you have any questions please feel free to the call the clinic at anytime at [phone].  Have a blessed day,  Dr. Charmayne

## 2024-02-15 ENCOUNTER — Ambulatory Visit: Payer: Self-pay

## 2024-02-15 LAB — CBC WITH DIFFERENTIAL/PLATELET
Basophils Absolute: 0.1 x10E3/uL (ref 0.0–0.2)
Basos: 1 %
EOS (ABSOLUTE): 0.4 x10E3/uL (ref 0.0–0.4)
Eos: 4 %
Hematocrit: 36.3 % (ref 34.0–46.6)
Hemoglobin: 12 g/dL (ref 11.1–15.9)
Immature Grans (Abs): 0 x10E3/uL (ref 0.0–0.1)
Immature Granulocytes: 0 %
Lymphocytes Absolute: 3.7 x10E3/uL — ABNORMAL HIGH (ref 0.7–3.1)
Lymphs: 35 %
MCH: 33.8 pg — ABNORMAL HIGH (ref 26.6–33.0)
MCHC: 33.1 g/dL (ref 31.5–35.7)
MCV: 102 fL — ABNORMAL HIGH (ref 79–97)
Monocytes Absolute: 0.6 x10E3/uL (ref 0.1–0.9)
Monocytes: 6 %
Neutrophils Absolute: 5.8 x10E3/uL (ref 1.4–7.0)
Neutrophils: 54 %
Platelets: 304 x10E3/uL (ref 150–450)
RBC: 3.55 x10E6/uL — ABNORMAL LOW (ref 3.77–5.28)
RDW: 12.1 % (ref 11.7–15.4)
WBC: 10.6 x10E3/uL (ref 3.4–10.8)

## 2024-02-15 LAB — BASIC METABOLIC PANEL WITH GFR
BUN/Creatinine Ratio: 26 — ABNORMAL HIGH (ref 9–23)
BUN: 24 mg/dL (ref 6–24)
CO2: 21 mmol/L (ref 20–29)
Calcium: 9.8 mg/dL (ref 8.7–10.2)
Chloride: 106 mmol/L (ref 96–106)
Creatinine, Ser: 0.92 mg/dL (ref 0.57–1.00)
Glucose: 71 mg/dL (ref 70–99)
Potassium: 4.5 mmol/L (ref 3.5–5.2)
Sodium: 141 mmol/L (ref 134–144)
eGFR: 73 mL/min/1.73 (ref >59)

## 2024-02-15 LAB — LIPID PANEL
Chol/HDL Ratio: 3 ratio (ref 0.0–4.4)
Cholesterol, Total: 145 mg/dL (ref 100–199)
HDL: 49 mg/dL (ref >39)
LDL Chol Calc (NIH): 80 mg/dL (ref 0–99)
Triglycerides: 85 mg/dL (ref 0–149)
VLDL Cholesterol Cal: 16 mg/dL (ref 5–40)

## 2024-02-15 NOTE — Progress Notes (Signed)
 Mildly elevated BUN/Cr within an otherwise normal BMP in a patient with A1c of 5.5%, likely prerenal azotemia in the setting of dehydration.  Mildly elevated abs lymph with normal WBC is likely a benign or reactive process as patient did not have constitutional symptoms, organomegaly or lymphadenopathy on exam.  Since 2016, patient has had mildly low RBC with mildly elevated MCV/MCH. Her hemoglobin remains consistently stable. This was worked up extensively in 12/2018 with smear, liver profile, folic acid , vitamin B12, and a TSH. At this time, the smear confirmed macrocytosis and workup was within normal limits and did not find an etiology. Given patients oncologic history, the chronicity of the macrocytosis, the absence of abnormal clinical findings on exam, absence of anemia or cytopenias, these are likely benign findings. Will continue to monitor and can consider further workup if symptoms or new risk factors arise.

## 2024-03-14 LAB — COLOGUARD: COLOGUARD: NEGATIVE

## 2024-05-04 ENCOUNTER — Other Ambulatory Visit: Payer: Self-pay

## 2024-05-05 ENCOUNTER — Other Ambulatory Visit: Payer: Self-pay

## 2024-05-05 DIAGNOSIS — I10 Essential (primary) hypertension: Secondary | ICD-10-CM

## 2024-05-05 NOTE — Telephone Encounter (Unsigned)
 Copied from CRM #[id number]. Topic: Clinical - Medication Refill >> May 05, 2024 12:47 PM Shamecia H wrote: Medication: lisinopril  (ZESTRIL ) 20 MG tablet  Has the patient contacted their pharmacy? Yes (Agent: If no, request that the patient contact the pharmacy for the refill. If patient does not wish to contact the pharmacy document the reason why and proceed with request.) (Agent: If yes, when and what did the pharmacy advise?)  This is the patient's preferred pharmacy:  [facility] [address], [address] [place] [address] Phone: [phone] Fax: [phone]  Is this the correct pharmacy for this prescription? Yes If no, delete pharmacy and type the correct one.   Has the prescription been filled recently? No  Is the patient out of the medication? Yes  Has the patient been seen for an appointment in the last year OR does the patient have an upcoming appointment? Yes  Can we respond through MyChart? Yes  Agent: Please be advised that Rx refills may take up to 3 business days. We ask that you follow-up with your pharmacy.

## 2024-05-06 ENCOUNTER — Other Ambulatory Visit (HOSPITAL_COMMUNITY): Payer: Self-pay

## 2024-05-06 MED ORDER — LISINOPRIL 20 MG PO TABS
20.0000 mg | ORAL_TABLET | Freq: Every day | ORAL | 3 refills | Status: AC
  Filled 2024-05-06: qty 90, 90d supply, fill #0

## 2024-05-20 ENCOUNTER — Other Ambulatory Visit: Payer: Self-pay | Admitting: Student in an Organized Health Care Education/Training Program

## 2024-05-20 DIAGNOSIS — Z1231 Encounter for screening mammogram for malignant neoplasm of breast: Secondary | ICD-10-CM

## 2024-06-18 ENCOUNTER — Ambulatory Visit

## 2024-06-24 ENCOUNTER — Inpatient Hospital Stay
Admission: RE | Admit: 2024-06-24 | Discharge: 2024-06-24 | Attending: Student in an Organized Health Care Education/Training Program

## 2024-06-24 DIAGNOSIS — Z1231 Encounter for screening mammogram for malignant neoplasm of breast: Secondary | ICD-10-CM

## 2024-09-09 ENCOUNTER — Ambulatory Visit
